# Patient Record
Sex: Male | Born: 1970 | Race: White | Hispanic: No | Marital: Single | Smoking: Never smoker
Health system: Southern US, Community
[De-identification: ages and names within clinical notes are randomized; demographics above are authoritative.]

## PROBLEM LIST (undated history)

## (undated) DIAGNOSIS — F840 Autistic disorder: Secondary | ICD-10-CM

## (undated) DIAGNOSIS — I509 Heart failure, unspecified: Secondary | ICD-10-CM

## (undated) DIAGNOSIS — T7840XA Allergy, unspecified, initial encounter: Secondary | ICD-10-CM

## (undated) DIAGNOSIS — E669 Obesity, unspecified: Secondary | ICD-10-CM

## (undated) DIAGNOSIS — E119 Type 2 diabetes mellitus without complications: Secondary | ICD-10-CM

## (undated) HISTORY — DX: Type 2 diabetes mellitus without complications: E11.9

## (undated) HISTORY — DX: Obesity, unspecified: E66.9

## (undated) HISTORY — DX: Allergy, unspecified, initial encounter: T78.40XA

## (undated) HISTORY — DX: Heart failure, unspecified: I50.9

---

## 2010-11-21 ENCOUNTER — Encounter: Payer: Self-pay | Admitting: *Deleted

## 2010-11-21 ENCOUNTER — Emergency Department (HOSPITAL_COMMUNITY): Payer: Medicare Other

## 2010-11-21 ENCOUNTER — Emergency Department (HOSPITAL_COMMUNITY)
Admission: EM | Admit: 2010-11-21 | Discharge: 2010-11-21 | Disposition: A | Discharge status: Home / Self Care | Payer: Medicare Other | Attending: Emergency Medicine | Admitting: Emergency Medicine

## 2010-11-21 DIAGNOSIS — F840 Autistic disorder: Secondary | ICD-10-CM | POA: Insufficient documentation

## 2010-11-21 DIAGNOSIS — M79609 Pain in unspecified limb: Secondary | ICD-10-CM | POA: Insufficient documentation

## 2010-11-21 DIAGNOSIS — M79672 Pain in left foot: Secondary | ICD-10-CM

## 2010-11-21 DIAGNOSIS — X58XXXA Exposure to other specified factors, initial encounter: Secondary | ICD-10-CM | POA: Insufficient documentation

## 2010-11-21 HISTORY — DX: Autistic disorder: F84.0

## 2010-11-21 NOTE — ED Notes (Signed)
Pt c/o pain in his left foot. States that he is unable to walk on it. Pt put his foot in front of a golf cart on Saturday to stop it and injured it. Foot is swollen.

## 2010-11-21 NOTE — ED Notes (Signed)
Patient is comfortable just wanting to eat something states he hasn't ate all day. Family member told patient that he could wait till the doctor gives him a verdict. RN Darel Hong aware.

## 2010-12-27 NOTE — ED Provider Notes (Signed)
History     CSN: [id number] Arrival date & time: 11/21/2010 10:42 AM   Chief Complaint  Patient presents with  . Foot Pain     (Include location/radiation/quality/duration/timing/severity/associated sxs/prior treatment) HPI Pt c/o pain in his left foot. States that he is unable to walk on it. Pt put his foot in front of a golf cart on Saturday to stop it and injured it. Foot is swollen.          Past Medical History  Diagnosis Date  . Autism      History reviewed. No pertinent past surgical history.  Family History  Problem Relation Age of Onset  . Diabetes Mother   . Cancer Father   . Diabetes Father     History  Substance Use Topics  . Smoking status: Never Smoker   . Smokeless tobacco: Not on file  . Alcohol Use: No      Review of Systems  Constitutional: Negative for fever.  Musculoskeletal:       Left foot pain  Skin: Negative for rash and wound.  Neurological: Negative for weakness.    Allergies  Review of patient's allergies indicates no known allergies.  Home Medications   Current Outpatient Rx  Name Route Sig Dispense Refill  . ACETAMINOPHEN 500 MG PO TABS Oral Take 1,000 mg by mouth every 6 (six) hours as needed. For pain       Physical Exam    BP 118/74  Pulse 78  Temp(Src) 97.3 F (36.3 C) (Oral)  Resp 16  Ht 6\' 1"  (1.854 m)  Wt 280 lb (127.007 kg)  BMI 36.94 kg/m2  SpO2 99%  Physical Exam  Constitutional: He appears well-developed and well-nourished.  HENT:  Head: Normocephalic and atraumatic.  Eyes: Pupils are equal, round, and reactive to light.  Neck: Normal range of motion.  Abdominal: He exhibits no distension.  Musculoskeletal:       Left foot tenderness    ED Course  Procedures  No results found for this or any previous visit. No results found.   1. Foot pain, left      MDM Left foot pain       Nicholes Stairs, MD 12/27/10 2054

## 2013-09-22 ENCOUNTER — Emergency Department (HOSPITAL_COMMUNITY)
Admission: EM | Admit: 2013-09-22 | Discharge: 2013-09-22 | Disposition: A | Discharge status: Home / Self Care | Payer: Medicare Other | Attending: Emergency Medicine | Admitting: Emergency Medicine

## 2013-09-22 ENCOUNTER — Encounter (HOSPITAL_COMMUNITY): Payer: Self-pay | Admitting: Emergency Medicine

## 2013-09-22 ENCOUNTER — Emergency Department (HOSPITAL_COMMUNITY): Payer: Medicare Other

## 2013-09-22 DIAGNOSIS — M25569 Pain in unspecified knee: Secondary | ICD-10-CM | POA: Diagnosis present

## 2013-09-22 DIAGNOSIS — IMO0002 Reserved for concepts with insufficient information to code with codable children: Secondary | ICD-10-CM | POA: Diagnosis not present

## 2013-09-22 DIAGNOSIS — F840 Autistic disorder: Secondary | ICD-10-CM | POA: Insufficient documentation

## 2013-09-22 DIAGNOSIS — M25559 Pain in unspecified hip: Secondary | ICD-10-CM | POA: Diagnosis not present

## 2013-09-22 DIAGNOSIS — M5416 Radiculopathy, lumbar region: Secondary | ICD-10-CM

## 2013-09-22 DIAGNOSIS — R269 Unspecified abnormalities of gait and mobility: Secondary | ICD-10-CM | POA: Diagnosis not present

## 2013-09-22 NOTE — ED Provider Notes (Signed)
CSN: [id number]     Arrival date & time 09/22/13  1117 History  This chart was scribed for non-physician practitioner Glendell Docker, NP working with Carmin Muskrat, MD by Eston Mould, ED Scribe. This patient was seen in room TR05C/TR05C and the patient's care was started at 11:34 AM .   Chief Complaint  Patient presents with  . Knee Pain  . Hip Pain   The history is provided by the patient and a parent. No language interpreter was used.   HPI Comments: Michael Ramos is a 43 y.o. male with autism who presents to the Emergency Department complaining of R knee and hip pain that began 2 weeks ago. Mother took pt to [facility] 2 weeks ago and was informed he possible has a pinched nerve. Pt was given a steroid shot for relief, X-ray of lower back for bulging disc or pinched nerve and discharged with muscle relaxer. Pt c/o mostly R knee pain and states pain worsens with weightbearing. Mother states R knee has been swollen. Mother states pt has had minimal back pain. Denies fevers, recent illness or injuries, and previous injuries.  Past Medical History  Diagnosis Date  . Autism    History reviewed. No pertinent past surgical history. Family History  Problem Relation Age of Onset  . Diabetes Mother   . Cancer Father   . Diabetes Father    History  Substance Use Topics  . Smoking status: Never Smoker   . Smokeless tobacco: Not on file  . Alcohol Use: No    Review of Systems  Constitutional: Negative for fever.  Musculoskeletal: Positive for arthralgias, back pain, gait problem and myalgias.  Skin: Negative for color change and rash.  All other systems reviewed and are negative.  Allergies  Review of patient's allergies indicates no known allergies.  Home Medications   Prior to Admission medications   Medication Sig Start Date End Date Taking? Authorizing Provider  acetaminophen (TYLENOL) 500 MG tablet Take 1,000 mg by mouth every 6 (six) hours as  needed. For pain     Historical Provider, MD   BP 138/96  Pulse 98  Temp(Src) 97.7 F (36.5 C) (Oral)  Resp 18  Ht 6\' 3"  (1.905 m)  Wt 300 lb (136.079 kg)  BMI 37.50 kg/m2  SpO2 95%  Physical Exam  Nursing note and vitals reviewed. Constitutional: He is oriented to person, place, and time. He appears well-developed and well-nourished. No distress.  HENT:  Head: Normocephalic and atraumatic.  Eyes: EOM are normal.  Neck: Neck supple. No tracheal deviation present.  Cardiovascular: Normal rate, regular rhythm and normal heart sounds.   Pulmonary/Chest: Effort normal and breath sounds normal. No respiratory distress.  Musculoskeletal: Normal range of motion.  Full ROM. No swelling or deformity noted.   Neurological: He is alert and oriented to person, place, and time.  Skin: Skin is warm and dry.  Psychiatric: He has a normal mood and affect. His behavior is normal.   ED Course  Procedures  DIAGNOSTIC STUDIES: Oxygen Saturation is 95% on RA, normal by my interpretation.    COORDINATION OF CARE: 11:39 AM-Discussed treatment plan which includes X-ray of R knee and hip. Informed mother of patient F/U with orthopedist is necessary for definitive treatment. Pt agreed to plan.   1:10 PM-Informed mother of pt of radiology findings,WNL. Will discharge pt with referrals to ortho. Mother of pt agreed to plan.  Labs Review Labs Reviewed - No data to display  Imaging Review Dg  Hip Complete Right  09/22/2013   CLINICAL DATA:  Right hip pain.  EXAM: RIGHT HIP - COMPLETE 2+ VIEW  COMPARISON:  None.  FINDINGS: The hips are normally located. No acute hip fracture or plain film evidence of avascular necrosis. The pubic symphysis and SI joints are intact. No pelvic fractures.  IMPRESSION: No acute bony findings.   Electronically Signed   By: Kalman Jewels M.D.   On: 09/22/2013 12:39   Dg Knee Complete 4 Views Right  09/22/2013   CLINICAL DATA:  Right hip pain radiates down to the right knee.  No known injury.  EXAM: RIGHT KNEE - COMPLETE 4+ VIEW  COMPARISON:  None.  FINDINGS: There is no evidence of fracture, dislocation, or joint effusion. There is no evidence of arthropathy or other focal bone abnormality. Soft tissues are unremarkable.  IMPRESSION: Negative.   Electronically Signed   By: Misty Stanley M.D.   On: 09/22/2013 12:39     EKG Interpretation None      MDM   Final diagnoses:  Lumbar radiculopathy    Pt is neurovascularly intact. No red flags. Pt has medications at home. Discussed with mother the need for possible mri of lumbar spine in the future  I personally performed the services described in this documentation, which was scribed in my presence. The recorded information has been reviewed and is accurate.    Glendell Docker, NP 09/22/13 1312

## 2013-09-22 NOTE — Discharge Instructions (Signed)

## 2013-09-22 NOTE — ED Notes (Signed)
Pt is autistic, c/o right knee and thigh pain x 2 weeks. Went to an urgent care at [facility], had lumbar xray and told "he might have a pinched nerve". Told to follow up with ortho but mother did not want to do that before getting another opinion.

## 2013-09-22 NOTE — ED Provider Notes (Signed)
  Medical screening examination/treatment/procedure(s) were performed by non-physician practitioner and as supervising physician I was immediately available for consultation/collaboration.   EKG Interpretation None         Carmin Muskrat, MD 09/22/13 1630

## 2013-09-22 NOTE — ED Notes (Signed)
Pt here for hip and knee pain x 2 weeks on right side, sts no injury. No orthopeadic

## 2015-06-21 DIAGNOSIS — J40 Bronchitis, not specified as acute or chronic: Secondary | ICD-10-CM | POA: Diagnosis not present

## 2015-09-03 DIAGNOSIS — R5383 Other fatigue: Secondary | ICD-10-CM | POA: Diagnosis not present

## 2015-09-03 DIAGNOSIS — E119 Type 2 diabetes mellitus without complications: Secondary | ICD-10-CM | POA: Diagnosis not present

## 2015-10-04 DIAGNOSIS — E139 Other specified diabetes mellitus without complications: Secondary | ICD-10-CM | POA: Diagnosis not present

## 2015-12-02 ENCOUNTER — Telehealth: Payer: Self-pay | Admitting: Physician Assistant

## 2015-12-03 NOTE — Telephone Encounter (Signed)
He was a new onset diabetic, since late spring, found through urgent care, started on med, then saw me in July.  He was told to get follow up.  I do think her is okay to be in the coming weeks and labs performed.  If any one can see him once or if my medicaid credentialing comes in soon I can do him.  Labs needed: CMP, A1c, LIPID.

## 2015-12-08 NOTE — Telephone Encounter (Signed)
I spoke with patient's mother and she understood that we will get him in as soon as insurance clears.

## 2015-12-27 ENCOUNTER — Other Ambulatory Visit: Payer: Self-pay | Admitting: Physician Assistant

## 2016-01-18 ENCOUNTER — Ambulatory Visit (INDEPENDENT_AMBULATORY_CARE_PROVIDER_SITE_OTHER): Payer: Medicare Other | Admitting: *Deleted

## 2016-01-18 DIAGNOSIS — Z23 Encounter for immunization: Secondary | ICD-10-CM

## 2016-03-14 ENCOUNTER — Encounter: Payer: Self-pay | Admitting: Physician Assistant

## 2016-03-14 ENCOUNTER — Ambulatory Visit (INDEPENDENT_AMBULATORY_CARE_PROVIDER_SITE_OTHER): Payer: Medicare Other | Admitting: Physician Assistant

## 2016-03-14 VITALS — BP 137/95 | HR 93 | Temp 96.8°F | Ht 75.0 in | Wt 282.0 lb

## 2016-03-14 DIAGNOSIS — E7800 Pure hypercholesterolemia, unspecified: Secondary | ICD-10-CM | POA: Diagnosis not present

## 2016-03-14 DIAGNOSIS — J3089 Other allergic rhinitis: Secondary | ICD-10-CM | POA: Diagnosis not present

## 2016-03-14 DIAGNOSIS — E119 Type 2 diabetes mellitus without complications: Secondary | ICD-10-CM

## 2016-03-14 DIAGNOSIS — F840 Autistic disorder: Secondary | ICD-10-CM | POA: Insufficient documentation

## 2016-03-14 DIAGNOSIS — E1169 Type 2 diabetes mellitus with other specified complication: Secondary | ICD-10-CM | POA: Insufficient documentation

## 2016-03-14 LAB — BAYER DCA HB A1C WAIVED: HB A1C (BAYER DCA - WAIVED): 6.1 % (ref <7.0)

## 2016-03-14 MED ORDER — LORATADINE 10 MG PO TABS: 10.0000 mg | ORAL_TABLET | Freq: Every day | ORAL | 11 refills | Status: DC

## 2016-03-14 MED ORDER — METFORMIN HCL 500 MG PO TABS: 500.0000 mg | ORAL_TABLET | Freq: Two times a day (BID) | ORAL | 5 refills | Status: DC

## 2016-03-14 NOTE — Progress Notes (Signed)
BP (!) 137/95   Pulse 93   Temp (!) 96.8 F (36 C) (Oral)   Ht 6' 3" (1.905 m)   Wt 282 lb (127.9 kg)   BMI 35.25 kg/m    Subjective:    Patient ID: Michael Ramos, male    DOB: [birth year], 45 y.o.   MRN: [id number]  Michael Ramos is a 45 y.o. male presenting on 03/14/2016 for Follow-up and Diabetes  HPI Patient here to be established as new patient at [facility].  This patient is known to me from [facility]. Commonly seen this patient twice this past year and they have been for the mild onset of diabetes. He has had a very good control with his A1c. He needs to have this performed again today. He reports that he is walking and exercising well. He is fairly well communicative with me by his mom is in the room. Sister is also present today. He was excited to talk about [place] basketball and professional wrestling. I do think we are developing a trusting relationship. All of his medications are reviewed today.  Past Medical History:  Diagnosis Date  . Allergy   . Autism   . Diabetes mellitus without complication ([place])    Relevant past medical, surgical, family and social history reviewed and updated as indicated. Interim medical history since our last visit reviewed. Allergies and medications reviewed and updated.   Data reviewed from any sources in EPIC.  Review of Systems  Constitutional: Negative.  Negative for appetite change and fatigue.  HENT: Negative.   Eyes: Negative.  Negative for pain and visual disturbance.  Respiratory: Negative.  Negative for cough, chest tightness, shortness of breath and wheezing.   Cardiovascular: Negative.  Negative for chest pain, palpitations and leg swelling.  Gastrointestinal: Negative.  Negative for abdominal pain, diarrhea, nausea and vomiting.  Endocrine: Negative.   Genitourinary: Negative.   Musculoskeletal: Negative.   Skin: Negative.  Negative for color change and rash.  Neurological:  Negative.  Negative for weakness, numbness and headaches.  Psychiatric/Behavioral: Negative.      Social History   Social History  . Marital status: Single    Spouse name: N/A  . Number of children: N/A  . Years of education: N/A   Occupational History  . Not on file.   Social History Main Topics  . Smoking status: Never Smoker  . Smokeless tobacco: Never Used  . Alcohol use No  . Drug use: No  . Sexual activity: Not on file   Other Topics Concern  . Not on file   Social History Narrative  . No narrative on file    History reviewed. No pertinent surgical history.  Family History  Problem Relation Age of Onset  . Diabetes Mother   . Cancer Father   . Diabetes Father       Medication List       Accurate as of 03/14/16  9:56 PM. Always use your most recent med list.          loratadine 10 MG tablet Commonly known as:  CLARITIN Take 1 tablet (10 mg total) by mouth daily.   metFORMIN 500 MG tablet Commonly known as:  GLUCOPHAGE Take 1 tablet (500 mg total) by mouth 2 (two) times daily.          Objective:    BP (!) 137/95   Pulse 93   Temp (!) 96.8 F (36 C) (Oral)   Ht 6' 3" (  1.905 m)   Wt 282 lb (127.9 kg)   BMI 35.25 kg/m   No Known Allergies Wt Readings from Last 3 Encounters:  03/14/16 282 lb (127.9 kg)  09/22/13 300 lb (136.1 kg)  11/21/10 280 lb (127 kg)    Physical Exam  Constitutional: He appears well-developed and well-nourished. No distress.  HENT:  Head: Normocephalic and atraumatic.  Eyes: Conjunctivae and EOM are normal. Pupils are equal, round, and reactive to light.  Cardiovascular: Normal rate, regular rhythm and normal heart sounds.   Pulmonary/Chest: Effort normal and breath sounds normal. No respiratory distress.  Skin: Skin is warm and dry.  Psychiatric: He has a normal mood and affect. His behavior is normal.  Nursing note and vitals reviewed.   Results for orders placed or performed in visit on 03/14/16  Bayer  DCA Hb A1c Waived  Result Value Ref Range   Bayer DCA Hb A1c Waived 6.1 <7.0 %      Assessment & Plan:   1. Type 2 diabetes mellitus without complication, without long-term current use of insulin (HCC) - metFORMIN (GLUCOPHAGE) 500 MG tablet; Take 1 tablet (500 mg total) by mouth 2 (two) times daily.  Dispense: 60 tablet; Refill: 5 - Bayer DCA Hb A1c Waived - CBC with Differential/Platelet - CMP14+EGFR - Lipid panel  2. Chronic nonseasonal allergic rhinitis due to pollen - loratadine (CLARITIN) 10 MG tablet; Take 1 tablet (10 mg total) by mouth daily.  Dispense: 30 tablet; Refill: 11  3. Autism  4. Elevated cholesterol - CMP14+EGFR - Lipid panel  5. Elevated blood pressure reading Daily walking and exercise, low fat, low cholesterol diet  Continue all other maintenance medications as listed above. Educational handout given for carb counting  Follow up plan: Return in about 3 months (around 06/12/2016).  Terald Sleeper PA-C [facility] [address]  [place], [place] [address] [phone]   03/14/2016, 9:56 PM

## 2016-03-14 NOTE — Patient Instructions (Signed)
Carbohydrate Counting for Diabetes Mellitus, Adult Carbohydrate counting is a method for keeping track of how many carbohydrates you eat. Eating carbohydrates naturally increases the amount of sugar (glucose) in the blood. Counting how many carbohydrates you eat helps keep your blood glucose within normal limits, which helps you manage your diabetes (diabetes mellitus). It is important to know how many carbohydrates you can safely have in each meal. This is different for every person. A diet and nutrition specialist (registered dietitian) can help you make a meal plan and calculate how many carbohydrates you should have at each meal and snack. Carbohydrates are found in the following foods:  Grains, such as breads and cereals.  Dried beans and soy products.  Starchy vegetables, such as potatoes, peas, and corn.  Fruit and fruit juices.  Milk and yogurt.  Sweets and snack foods, such as cake, cookies, candy, chips, and soft drinks. How do I count carbohydrates? There are two ways to count carbohydrates in food. You can use either of the methods or a combination of both. Reading "Nutrition Facts" on packaged food  The "Nutrition Facts" list is included on the labels of almost all packaged foods and beverages in the [place] It includes:  The serving size.  Information about nutrients in each serving, including the grams (g) of carbohydrate per serving. To use the "Nutrition Facts":  Decide how many servings you will have.  Multiply the number of servings by the number of carbohydrates per serving.  The resulting number is the total amount of carbohydrates that you will be having. Learning standard serving sizes of other foods  When you eat foods containing carbohydrates that are not packaged or do not include "Nutrition Facts" on the label, you need to measure the servings in order to count the amount of carbohydrates:  Measure the foods that you will eat with a food scale or measuring  cup, if needed.  Decide how many standard-size servings you will eat.  Multiply the number of servings by 15. Most carbohydrate-rich foods have about 15 g of carbohydrates per serving.  For example, if you eat 8 oz (170 g) of strawberries, you will have eaten 2 servings and 30 g of carbohydrates (2 servings x 15 g = 30 g).  For foods that have more than one food mixed, such as soups and casseroles, you must count the carbohydrates in each food that is included. The following list contains standard serving sizes of common carbohydrate-rich foods. Each of these servings has about 15 g of carbohydrates:   hamburger bun or  English muffin.   oz (15 mL) syrup.   oz (14 g) jelly.  1 slice of bread.  1 six-inch tortilla.  3 oz (85 g) cooked rice or pasta.  4 oz (113 g) cooked dried beans.  4 oz (113 g) starchy vegetable, such as peas, corn, or potatoes.  4 oz (113 g) hot cereal.  4 oz (113 g) mashed potatoes or  of a large baked potato.  4 oz (113 g) canned or frozen fruit.  4 oz (120 mL) fruit juice.  4-6 crackers.  6 chicken nuggets.  6 oz (170 g) unsweetened dry cereal.  6 oz (170 g) plain fat-free yogurt or yogurt sweetened with artificial sweeteners.  8 oz (240 mL) milk.  8 oz (170 g) fresh fruit or one small piece of fruit.  24 oz (680 g) popped popcorn. Example of carbohydrate counting Sample meal  3 oz (85 g) chicken breast.  6 oz (  170 g) brown rice.  4 oz (113 g) corn.  8 oz (240 mL) milk.  8 oz (170 g) strawberries with sugar-free whipped topping. Carbohydrate calculation 1. Identify the foods that contain carbohydrates:  Rice.  Corn.  Milk.  Strawberries. 2. Calculate how many servings you have of each food:  2 servings rice.  1 serving corn.  1 serving milk.  1 serving strawberries. 3. Multiply each number of servings by 15 g:  2 servings rice x 15 g = 30 g.  1 serving corn x 15 g = 15 g.  1 serving milk x 15 g = 15  g.  1 serving strawberries x 15 g = 15 g. 4. Add together all of the amounts to find the total grams of carbohydrates eaten:  30 g + 15 g + 15 g + 15 g = 75 g of carbohydrates total. This information is not intended to replace advice given to you by your health care provider. Make sure you discuss any questions you have with your health care provider. Document Released: 03/27/2005 Document Revised: 10/15/2015 Document Reviewed: 09/08/2015 Elsevier Interactive Patient Education  2017 [employer]  

## 2016-03-15 LAB — CBC WITH DIFFERENTIAL/PLATELET
BASOS: 1 %
Basophils Absolute: 0.1 10*3/uL (ref 0.0–0.2)
EOS (ABSOLUTE): 0.3 10*3/uL (ref 0.0–0.4)
EOS: 3 %
HEMATOCRIT: 47.2 % (ref 37.5–51.0)
Hemoglobin: 15.3 g/dL (ref 13.0–17.7)
IMMATURE GRANULOCYTES: 0 %
Immature Grans (Abs): 0 10*3/uL (ref 0.0–0.1)
LYMPHS ABS: 1.8 10*3/uL (ref 0.7–3.1)
Lymphs: 19 %
MCH: 28.8 pg (ref 26.6–33.0)
MCHC: 32.4 g/dL (ref 31.5–35.7)
MCV: 89 fL (ref 79–97)
MONOCYTES: 8 %
Monocytes Absolute: 0.7 10*3/uL (ref 0.1–0.9)
Neutrophils Absolute: 6.6 10*3/uL (ref 1.4–7.0)
Neutrophils: 69 %
PLATELETS: 230 10*3/uL (ref 150–379)
RBC: 5.31 x10E6/uL (ref 4.14–5.80)
RDW: 14 % (ref 12.3–15.4)
WBC: 9.4 10*3/uL (ref 3.4–10.8)

## 2016-03-15 LAB — CMP14+EGFR
ALT: 26 IU/L (ref 0–44)
AST: 17 IU/L (ref 0–40)
Albumin/Globulin Ratio: 1.4 (ref 1.2–2.2)
Albumin: 4 g/dL (ref 3.5–5.5)
Alkaline Phosphatase: 33 IU/L — ABNORMAL LOW (ref 39–117)
BUN/Creatinine Ratio: 12 (ref 9–20)
BUN: 15 mg/dL (ref 6–24)
Bilirubin Total: 0.4 mg/dL (ref 0.0–1.2)
CALCIUM: 9.2 mg/dL (ref 8.7–10.2)
CO2: 24 mmol/L (ref 18–29)
CREATININE: 1.27 mg/dL (ref 0.76–1.27)
Chloride: 102 mmol/L (ref 96–106)
GFR calc Af Amer: 79 mL/min/{1.73_m2} (ref >59)
GFR, EST NON AFRICAN AMERICAN: 68 mL/min/{1.73_m2} (ref >59)
Globulin, Total: 2.9 g/dL (ref 1.5–4.5)
Glucose: 124 mg/dL — ABNORMAL HIGH (ref 65–99)
Potassium: 5.1 mmol/L (ref 3.5–5.2)
Sodium: 142 mmol/L (ref 134–144)
TOTAL PROTEIN: 6.9 g/dL (ref 6.0–8.5)

## 2016-03-15 LAB — LIPID PANEL
CHOL/HDL RATIO: 2.7 ratio (ref 0.0–5.0)
Cholesterol, Total: 126 mg/dL (ref 100–199)
HDL: 47 mg/dL (ref >39)
LDL CALC: 42 mg/dL (ref 0–99)
TRIGLYCERIDES: 187 mg/dL — AB (ref 0–149)
VLDL CHOLESTEROL CAL: 37 mg/dL (ref 5–40)

## 2016-07-20 ENCOUNTER — Other Ambulatory Visit: Payer: Self-pay | Admitting: Physician Assistant

## 2016-07-20 DIAGNOSIS — E119 Type 2 diabetes mellitus without complications: Secondary | ICD-10-CM

## 2016-08-15 ENCOUNTER — Emergency Department (HOSPITAL_COMMUNITY): Payer: Medicare Other

## 2016-08-15 ENCOUNTER — Encounter (HOSPITAL_COMMUNITY): Payer: Self-pay | Admitting: *Deleted

## 2016-08-15 ENCOUNTER — Inpatient Hospital Stay (HOSPITAL_COMMUNITY)
Admission: EM | Admit: 2016-08-15 | Discharge: 2016-08-19 | DRG: 292 | Disposition: A | Discharge status: Home / Self Care | Payer: Medicare Other | Attending: Internal Medicine | Admitting: Internal Medicine

## 2016-08-15 DIAGNOSIS — J90 Pleural effusion, not elsewhere classified: Secondary | ICD-10-CM

## 2016-08-15 DIAGNOSIS — N179 Acute kidney failure, unspecified: Secondary | ICD-10-CM | POA: Diagnosis present

## 2016-08-15 DIAGNOSIS — Z8249 Family history of ischemic heart disease and other diseases of the circulatory system: Secondary | ICD-10-CM

## 2016-08-15 DIAGNOSIS — R0602 Shortness of breath: Secondary | ICD-10-CM | POA: Diagnosis not present

## 2016-08-15 DIAGNOSIS — F840 Autistic disorder: Secondary | ICD-10-CM | POA: Diagnosis not present

## 2016-08-15 DIAGNOSIS — Z6834 Body mass index (BMI) 34.0-34.9, adult: Secondary | ICD-10-CM

## 2016-08-15 DIAGNOSIS — E119 Type 2 diabetes mellitus without complications: Secondary | ICD-10-CM | POA: Diagnosis not present

## 2016-08-15 DIAGNOSIS — Z7984 Long term (current) use of oral hypoglycemic drugs: Secondary | ICD-10-CM

## 2016-08-15 DIAGNOSIS — E669 Obesity, unspecified: Secondary | ICD-10-CM | POA: Diagnosis present

## 2016-08-15 DIAGNOSIS — I313 Pericardial effusion (noninflammatory): Secondary | ICD-10-CM | POA: Diagnosis present

## 2016-08-15 DIAGNOSIS — I5021 Acute systolic (congestive) heart failure: Secondary | ICD-10-CM | POA: Diagnosis not present

## 2016-08-15 DIAGNOSIS — R079 Chest pain, unspecified: Secondary | ICD-10-CM | POA: Diagnosis not present

## 2016-08-15 DIAGNOSIS — R410 Disorientation, unspecified: Secondary | ICD-10-CM | POA: Diagnosis not present

## 2016-08-15 DIAGNOSIS — S30861A Insect bite (nonvenomous) of abdominal wall, initial encounter: Secondary | ICD-10-CM | POA: Diagnosis present

## 2016-08-15 DIAGNOSIS — W57XXXA Bitten or stung by nonvenomous insect and other nonvenomous arthropods, initial encounter: Secondary | ICD-10-CM | POA: Diagnosis present

## 2016-08-15 DIAGNOSIS — T501X5A Adverse effect of loop [high-ceiling] diuretics, initial encounter: Secondary | ICD-10-CM | POA: Diagnosis present

## 2016-08-15 DIAGNOSIS — E1169 Type 2 diabetes mellitus with other specified complication: Secondary | ICD-10-CM

## 2016-08-15 DIAGNOSIS — I493 Ventricular premature depolarization: Secondary | ICD-10-CM | POA: Diagnosis present

## 2016-08-15 LAB — BASIC METABOLIC PANEL
Anion gap: 9 (ref 5–15)
BUN: 11 mg/dL (ref 6–20)
CHLORIDE: 103 mmol/L (ref 101–111)
CO2: 27 mmol/L (ref 22–32)
Calcium: 9 mg/dL (ref 8.9–10.3)
Creatinine, Ser: 1.27 mg/dL — ABNORMAL HIGH (ref 0.61–1.24)
Glucose, Bld: 143 mg/dL — ABNORMAL HIGH (ref 65–99)
POTASSIUM: 4.5 mmol/L (ref 3.5–5.1)
SODIUM: 139 mmol/L (ref 135–145)

## 2016-08-15 LAB — CBC
HEMATOCRIT: 46.8 % (ref 39.0–52.0)
Hemoglobin: 15.6 g/dL (ref 13.0–17.0)
MCH: 29.4 pg (ref 26.0–34.0)
MCHC: 33.3 g/dL (ref 30.0–36.0)
MCV: 88.3 fL (ref 78.0–100.0)
PLATELETS: 220 10*3/uL (ref 150–400)
RBC: 5.3 MIL/uL (ref 4.22–5.81)
RDW: 13.6 % (ref 11.5–15.5)
WBC: 11 10*3/uL — AB (ref 4.0–10.5)

## 2016-08-15 LAB — LACTIC ACID, PLASMA: Lactic Acid, Venous: 1.2 mmol/L (ref 0.5–1.9)

## 2016-08-15 LAB — TROPONIN I: Troponin I: <0.03 ng/mL (ref <0.03)

## 2016-08-15 LAB — HEPATIC FUNCTION PANEL
ALT: 51 U/L (ref 17–63)
AST: 30 U/L (ref 15–41)
Albumin: 3.9 g/dL (ref 3.5–5.0)
Alkaline Phosphatase: 32 U/L — ABNORMAL LOW (ref 38–126)
Bilirubin, Direct: 0.2 mg/dL (ref 0.1–0.5)
Indirect Bilirubin: 0.6 mg/dL (ref 0.3–0.9)
TOTAL PROTEIN: 7.3 g/dL (ref 6.5–8.1)
Total Bilirubin: 0.8 mg/dL (ref 0.3–1.2)

## 2016-08-15 LAB — BRAIN NATRIURETIC PEPTIDE: B NATRIURETIC PEPTIDE 5: 194 pg/mL — AB (ref 0.0–100.0)

## 2016-08-15 MED ORDER — SODIUM CHLORIDE 0.9 % IN NEBU
INHALATION_SOLUTION | RESPIRATORY_TRACT | Status: AC
  Administered 2016-08-15: 3 mL
  Filled 2016-08-15: qty 3

## 2016-08-15 MED ORDER — LEVALBUTEROL HCL 1.25 MG/0.5ML IN NEBU
1.2500 mg | INHALATION_SOLUTION | Freq: Four times a day (QID) | RESPIRATORY_TRACT | Status: DC | PRN
  Administered 2016-08-15 – 2016-08-18 (×6): 1.25 mg via RESPIRATORY_TRACT
  Filled 2016-08-15 (×6): qty 0.5

## 2016-08-15 MED ORDER — SODIUM CHLORIDE 0.9 % IV BOLUS (SEPSIS)
2000.0000 mL | Freq: Once | INTRAVENOUS | Status: AC
  Administered 2016-08-15: 2000 mL via INTRAVENOUS

## 2016-08-15 MED ORDER — IOPAMIDOL (ISOVUE-300) INJECTION 61%
75.0000 mL | Freq: Once | INTRAVENOUS | Status: AC | PRN
  Administered 2016-08-15: 75 mL via INTRAVENOUS

## 2016-08-15 MED ORDER — ACETAMINOPHEN 325 MG PO TABS: 650.0000 mg | ORAL_TABLET | Freq: Four times a day (QID) | ORAL | Status: DC | PRN

## 2016-08-15 MED ORDER — LORAZEPAM 1 MG PO TABS
1.0000 mg | ORAL_TABLET | Freq: Once | ORAL | Status: AC
  Administered 2016-08-15: 1 mg via ORAL
  Filled 2016-08-15: qty 1

## 2016-08-15 MED ORDER — ACETAMINOPHEN 650 MG RE SUPP: 650.0000 mg | Freq: Four times a day (QID) | RECTAL | Status: DC | PRN

## 2016-08-15 MED ORDER — GI COCKTAIL ~~LOC~~: 30.0000 mL | Freq: Two times a day (BID) | ORAL | Status: DC | PRN

## 2016-08-15 MED ORDER — ENOXAPARIN SODIUM 60 MG/0.6ML ~~LOC~~ SOLN
60.0000 mg | SUBCUTANEOUS | Status: DC
  Administered 2016-08-15 – 2016-08-18 (×4): 60 mg via SUBCUTANEOUS
  Filled 2016-08-15 (×4): qty 0.6

## 2016-08-15 MED ORDER — GUAIFENESIN-DM 100-10 MG/5ML PO SYRP
5.0000 mL | ORAL_SOLUTION | ORAL | Status: DC | PRN
  Administered 2016-08-16 – 2016-08-19 (×8): 5 mL via ORAL
  Filled 2016-08-15 (×8): qty 5

## 2016-08-15 MED ORDER — HYDROCOD POLST-CPM POLST ER 10-8 MG/5ML PO SUER
5.0000 mL | Freq: Once | ORAL | Status: AC
  Administered 2016-08-15: 5 mL via ORAL
  Filled 2016-08-15: qty 5

## 2016-08-15 MED ORDER — LEVOFLOXACIN IN D5W 500 MG/100ML IV SOLN
500.0000 mg | Freq: Once | INTRAVENOUS | Status: AC
  Administered 2016-08-15: 500 mg via INTRAVENOUS
  Filled 2016-08-15: qty 100

## 2016-08-15 NOTE — ED Notes (Signed)
Pt 02 sat 87 % on RA while sleeping, pt woke up and sat increased to 99% on RA, Dr. Roderic Palau notified.

## 2016-08-15 NOTE — H&P (Signed)
History and Physical    BAZIL DHANANI SJG:[id number] DOB: 1970-08-16 DOA: 08/15/2016  PCP: Terald Sleeper, PA-C   Patient coming from: Home  Chief Complaint: Chest pain, stomach pain, cough  HPI: Michael Ramos is a 46 y.o. male with medical history significant of autism/ Aspereger's presents with his mother for concern of chest pain and stomach pain.  Per mother patient was essentially normal/ at baseline yesterday but last night shortly after midnight patient woke his mother up with concerns of abdominal pain and chest pain.  Mother voices that patient did not sleep much last night due to going to bathroom repeatedly.  Mother reports that patient was unable to lay flat last night secondary to being short of breath.  Mother brought patient to urgent care this morning and one of the nurses told patient's mother that he was worried about the patient.  Mother says that patient seemed to be in a fog this morning, needing questions repeated numerous times before being able to answer.  Patient mother also says that patient removed a tick from his left side a few days ago.  Denies any fever, chills, denies nausea, vomiting, constipation.  8 stools last night but mother is unsure if they were loose or if they were bloody.  ED Course: Blood work showing creatinine of 1.29, BNP of 194.0, WBC of 11.  Blood cultures were collected.  Chest xray performed showing Mild right lower lobe airspace disease and small right effusion. Differential diagnosis includes infection and pulmonary embolism, Neoplasm also in the differential.  CT scan w/ contrast ordered and showed Moderate right and small left pleural effusion. Bibasilar dependent airspace disease likely reflecting atelectasis versus less likely pneumonia. Bilateral mild interstitial thickening suggesting an element of mild pulmonary edema. TRH was asked to admit for evaluation of cough, chest pain and stomach pain.  Review of Systems: As per HPI otherwise 10  point review of systems negative.    Past Medical History:  Diagnosis Date  . Allergy   . Autism   . Diabetes mellitus without complication ([place])     History reviewed. No pertinent surgical history.   reports that he has never smoked. He has never used smokeless tobacco. He reports that he does not drink alcohol or use drugs.  No Known Allergies  Family History  Problem Relation Age of Onset  . Diabetes Mother   . Cancer Father   . Diabetes Father      Prior to Admission medications   Medication Sig Start Date End Date Taking? Authorizing Provider  loratadine (CLARITIN) 10 MG tablet Take 1 tablet (10 mg total) by mouth daily. 03/14/16  Yes Terald Sleeper, PA-C  metFORMIN (GLUCOPHAGE) 500 MG tablet TAKE ONE TABLET BY MOUTH TWICE DAILY 07/21/16  Yes Terald Sleeper, PA-C    Physical Exam: Vitals:   08/15/16 1400 08/15/16 1500 08/15/16 1710 08/15/16 1712  BP: (!) 134/92 (!) 159/97 (!) 141/119 (!) 161/98  Pulse: (!) 57 (!) 57 (!) 109 (!) 52  Resp: (!) 24 20 18 18   Temp:    97.6 F (36.4 C)  TempSrc:    Oral  SpO2: 100% 95% 94% 96%  Weight:      Height:          Constitutional: NAD, calm, comfortable Vitals:   08/15/16 1400 08/15/16 1500 08/15/16 1710 08/15/16 1712  BP: (!) 134/92 (!) 159/97 (!) 141/119 (!) 161/98  Pulse: (!) 57 (!) 57 (!) 109 (!) 52  Resp: (!) 24  20 18 18   Temp:    97.6 F (36.4 C)  TempSrc:    Oral  SpO2: 100% 95% 94% 96%  Weight:      Height:       Eyes: PERRL, lids and conjunctivae normal ENMT: Mucous membranes are moist. Posterior pharynx clear of any exudate or lesions.Normal dentition.  Neck: normal, supple, no masses, no thyromegaly Respiratory: clear to auscultation bilaterally, no wheezing, no crackles. Normal respiratory effort. No accessory muscle use.  Cardiovascular: Regular rate and rhythm, no murmurs / rubs / gallops. No extremity edema. 2+ pedal pulses. No carotid bruits.  Abdomen: slight tenderness in the epigastrium, no  masses palpated. No hepatosplenomegaly. Bowel sounds positive.  Musculoskeletal: no clubbing / cyanosis. No joint deformity upper and lower extremities. Good ROM, no contractures. Normal muscle tone.  Skin: no rashes, lesions, ulcers. No induration Neurologic: CN 2-12 grossly intact. Sensation intact, DTR normal. Strength 5/5 in all 4.  Psychiatric: Alert and oriented to person. Normal mood.     Labs on Admission: I have personally reviewed following labs and imaging studies  CBC:  Recent Labs Lab 08/15/16 1115  WBC 11.0*  HGB 15.6  HCT 46.8  MCV 88.3  PLT 086   Basic Metabolic Panel:  Recent Labs Lab 08/15/16 1115  NA 139  K 4.5  CL 103  CO2 27  GLUCOSE 143*  BUN 11  CREATININE 1.27*  CALCIUM 9.0   GFR: Estimated Creatinine Clearance: 107.3 mL/min (A) (by C-G formula based on SCr of 1.27 mg/dL (H)). Liver Function Tests: No results for input(s): AST, ALT, ALKPHOS, BILITOT, PROT, ALBUMIN in the last 168 hours. No results for input(s): LIPASE, AMYLASE in the last 168 hours. No results for input(s): AMMONIA in the last 168 hours. Coagulation Profile: No results for input(s): INR, PROTIME in the last 168 hours. Cardiac Enzymes:  Recent Labs Lab 08/15/16 1115  TROPONINI <0.03   BNP (last 3 results) No results for input(s): PROBNP in the last 8760 hours. HbA1C: No results for input(s): HGBA1C in the last 72 hours. CBG: No results for input(s): GLUCAP in the last 168 hours. Lipid Profile: No results for input(s): CHOL, HDL, LDLCALC, TRIG, CHOLHDL, LDLDIRECT in the last 72 hours. Thyroid Function Tests: No results for input(s): TSH, T4TOTAL, FREET4, T3FREE, THYROIDAB in the last 72 hours. Anemia Panel: No results for input(s): VITAMINB12, FOLATE, FERRITIN, TIBC, IRON, RETICCTPCT in the last 72 hours. Urine analysis: No results found for: COLORURINE, APPEARANCEUR, LABSPEC, [place], GLUCOSEU, [place], BILIRUBINUR, KETONESUR, PROTEINUR, UROBILINOGEN, NITRITE,  LEUKOCYTESUR Sepsis Labs: !!!!!!!!!!!!!!!!!!!!!!!!!!!!!!!!!!!!!!!!!!!! @LABRCNTIP (procalcitonin:4,lacticidven:4) ) Recent Results (from the past 240 hour(s))  Blood culture (routine x 2)     Status: None (Preliminary result)   Collection Time: 08/15/16  1:22 PM  Result Value Ref Range Status   Specimen Description BLOOD RIGHT ARM  Final   Special Requests   Final    BOTTLES DRAWN AEROBIC AND ANAEROBIC Blood Culture adequate volume   Culture PENDING  Incomplete   Report Status PENDING  Incomplete  Blood culture (routine x 2)     Status: None (Preliminary result)   Collection Time: 08/15/16  1:29 PM  Result Value Ref Range Status   Specimen Description BLOOD RIGHT HAND  Final   Special Requests   Final    BOTTLES DRAWN AEROBIC ONLY Blood Culture results may not be optimal due to an inadequate volume of blood received in culture bottles   Culture PENDING  Incomplete   Report Status PENDING  Incomplete  Radiological Exams on Admission: Dg Chest 2 View  Result Date: 08/15/2016 CLINICAL DATA:  Chest pain EXAM: CHEST  2 VIEW COMPARISON:  None. FINDINGS: Small right pleural effusion with mild right lower lobe airspace disease. Left lung is clear. Negative for heart failure or edema. Heart size upper normal. No mass lesion identified. IMPRESSION: Mild right lower lobe airspace disease and small right effusion. Differential diagnosis includes infection and pulmonary embolism. Neoplasm also in the differential. Electronically Signed   By: Franchot Gallo M.D.   On: 08/15/2016 11:47   Ct Chest W Contrast  Result Date: 08/15/2016 CLINICAL DATA:  Cough since last night. EXAM: CT CHEST WITH CONTRAST TECHNIQUE: Multidetector CT imaging of the chest was performed during intravenous contrast administration. CONTRAST:  71mL ISOVUE-300 IOPAMIDOL (ISOVUE-300) INJECTION 61% COMPARISON:  None. FINDINGS: Cardiovascular: No significant vascular findings. Normal heart size. No pericardial effusion. Normal  caliber thoracic aorta. No thoracic aortic dissection. Mediastinum/Nodes: No enlarged mediastinal, hilar, or axillary lymph nodes. Thyroid gland, trachea, and esophagus demonstrate no significant findings. Lungs/Pleura: Moderate right and small left pleural effusion. Bibasilar dependent airspace disease likely reflecting atelectasis versus less likely pneumonia. No pneumothorax. Mild bilateral interstitial thickening. Upper Abdomen: No acute upper abdominal abnormality. Musculoskeletal: No acute osseous abnormality. No lytic or sclerotic osseous lesion. IMPRESSION: 1. Moderate right and small left pleural effusion. Bibasilar dependent airspace disease likely reflecting atelectasis versus less likely pneumonia. Bilateral mild interstitial thickening suggesting an element of mild pulmonary edema. Electronically Signed   By: Kathreen Devoid   On: 08/15/2016 13:32    EKG: Independently reviewed. Sinus tachycardia with PVCs  Assessment/Plan Principal Problem:   Shortness of breath Active Problems:   Type 2 diabetes mellitus without complication, without long-term current use of insulin (HCC)   Autism   Shortness of breath - CT showing pleural effusions - IR consult placed - patient not requiring supplemental oxygen - no respiratory distress currently - will place on telemetry - echocardiogram ordered - if patient appropriate for tap will need labs placed - troponins x 3 ordered  Type II diabetes  - carb modified diet - will order CBG's - hold metformin given the numerous bowel movements patient had  Chest pain - really epigastric pain - will order GI cocktail PRN - troponins x 3   DVT prophylaxis: lovenox  Code Status: Full Code  Family Communication: Mother is bedside  Disposition Plan: likely discharge back to previous home environment  Consults called: none  Admission status: observation, med- surg    Loretha Stapler MD [facility] Pager [phone]  If 7PM-7AM,  please contact night-coverage [email or web address] Password [email or web address]  08/15/2016, 5:29 PM

## 2016-08-15 NOTE — ED Triage Notes (Addendum)
Pt c/o diarrhea x 8, tick bite to abdomen 2 days ago, heart beating fast, mid chest pain, coughing, SOB, fatigue that started yesterday. Pt is autistic. Mother and sister at bedside. Pt will answer questions appropriately. Family reported pt has been disoriented this morning, gradually started last night.

## 2016-08-15 NOTE — Progress Notes (Signed)
PHarmacy Dosing Clarification  Pharmacy Consult for lovenox   Indication: DVT  No Known Allergies  Patient Measurements: Height: 6\' 3"  (190.5 cm) Weight: 284 lb 11.2 oz (129.1 kg) IBW/kg (Calculated) : 84.5   Vital Signs: Temp: 97.7 F (36.5 C) (05/08 1751) Temp Source: Oral (05/08 1751) BP: 152/105 (05/08 1751) Pulse Rate: 106 (05/08 1751)  Labs:  Recent Labs  08/15/16 1115  HGB 15.6  HCT 46.8  PLT 220  CREATININE 1.27*  TROPONINI <0.03    Estimated Creatinine Clearance: 107.4 mL/min (A) (by C-G formula based on SCr of 1.27 mg/dL (H)).   Medications:  Prescriptions Prior to Admission  Medication Sig Dispense Refill Last Dose  . loratadine (CLARITIN) 10 MG tablet Take 1 tablet (10 mg total) by mouth daily. 30 tablet 11 08/14/2016 at Unknown time  . metFORMIN (GLUCOPHAGE) 500 MG tablet TAKE ONE TABLET BY MOUTH TWICE DAILY 180 tablet 0 08/14/2016 at Unknown time    Assessment: 46 yo M with lovenox ordered for DVT pxl and BMI > 30.  Will give lovenox 0.5 mg /kg q24h. Further adjustments per md.  Goal of Therapy:  DVT prophylaxis    Plan:  Lovenox 60 mg sq q24h  Vonda Antigua 08/15/2016,6:22 PM

## 2016-08-15 NOTE — ED Provider Notes (Signed)
[place] DEPT Provider Note   CSN: [id number] Arrival date & time: 08/15/16  1025  By signing my name below, I, Hansel Feinstein, attest that this documentation has been prepared under the direction and in the presence of Milton Ferguson, MD. Electronically Signed: Hansel Feinstein, ED Scribe. 08/15/16. 12:21 PM.     History   Chief Complaint Chief Complaint  Patient presents with  . Chest Pain  . Diarrhea  . Fatigue    HPI Michael Ramos is a 46 y.o. male with h/o autism who presents to the Emergency Department complaining of moderate upper and mid abdominal pain that began yesterday. Pt and family additionally complain of CP, cough, SOB, 8 episodes of diarrhea. Per family, the pt had a tick bite on his abdomen 2 days ago. She also reports the pt has been delayed with answering questions with his symptoms. No worsening or alleviating factors noted. Pt and family deny emesis, additional complaints.   The history is provided by the patient. No language interpreter was used.  Abdominal Pain   This is a new problem. The current episode started yesterday. The problem occurs constantly. The problem has been gradually worsening. The pain is associated with an unknown factor. The pain is located in the epigastric region and periumbilical region. The pain is moderate. Associated symptoms include diarrhea. Pertinent negatives include vomiting, frequency, hematuria and headaches. Nothing aggravates the symptoms. Nothing relieves the symptoms.    Past Medical History:  Diagnosis Date  . Allergy   . Autism   . Diabetes mellitus without complication [facility])     Patient Active Problem List   Diagnosis Date Noted  . Type 2 diabetes mellitus without complication, without long-term current use of insulin ([place]) 03/14/2016  . Chronic nonseasonal allergic rhinitis due to pollen 03/14/2016  . Autism 03/14/2016    History reviewed. No pertinent surgical history.     Home Medications    Prior to  Admission medications   Medication Sig Start Date End Date Taking? Authorizing Provider  loratadine (CLARITIN) 10 MG tablet Take 1 tablet (10 mg total) by mouth daily. 03/14/16  Yes Terald Sleeper, PA-C  metFORMIN (GLUCOPHAGE) 500 MG tablet TAKE ONE TABLET BY MOUTH TWICE DAILY 07/21/16  Yes Terald Sleeper, PA-C    Family History Family History  Problem Relation Age of Onset  . Diabetes Mother   . Cancer Father   . Diabetes Father     Social History Social History  Substance Use Topics  . Smoking status: Never Smoker  . Smokeless tobacco: Never Used  . Alcohol use No     Allergies   Patient has no known allergies.   Review of Systems Review of Systems  Constitutional: Negative for appetite change and fatigue.  HENT: Negative for congestion, ear discharge and sinus pressure.   Eyes: Negative for discharge.  Respiratory: Positive for cough and shortness of breath.   Cardiovascular: Positive for chest pain.  Gastrointestinal: Positive for abdominal pain and diarrhea. Negative for vomiting.  Genitourinary: Negative for frequency and hematuria.  Musculoskeletal: Negative for back pain.  Skin: Negative for rash.  Neurological: Negative for seizures and headaches.  Psychiatric/Behavioral: Negative for hallucinations.     Physical Exam Updated Vital Signs Pulse (!) 103   Temp 97.6 F (36.4 C) (Oral)   Resp 18   Ht 6\' 3"  (1.905 m)   Wt 283 lb 14.4 oz (128.8 kg)   SpO2 98%   BMI 35.49 kg/m   Physical Exam  Constitutional: He  is oriented to person, place, and time. He appears well-developed.  HENT:  Head: Normocephalic.  Dry mucous membranes.   Eyes: Conjunctivae and EOM are normal. No scleral icterus.  Neck: Neck supple. No thyromegaly present.  Cardiovascular: Normal rate and regular rhythm.  Exam reveals no gallop and no friction rub.   No murmur heard. Pulmonary/Chest: No stridor. He has no wheezes. He has no rales. He exhibits no tenderness.  Abdominal: He  exhibits no distension. There is no tenderness. There is no rebound.  Musculoskeletal: Normal range of motion. He exhibits no edema.  Lymphadenopathy:    He has no cervical adenopathy.  Neurological: He is oriented to person, place, and time. He exhibits normal muscle tone. Coordination normal.  Seems lethargic.   Skin: Rash noted. No erythema.  Small rash to the left lower abdomen from a tick bite.   Psychiatric: He has a normal mood and affect. His behavior is normal.  Nursing note and vitals reviewed.    ED Treatments / Results   DIAGNOSTIC STUDIES: Oxygen Saturation is 98% on RA, normal by my interpretation.    COORDINATION OF CARE: 12:10 PM Discussed treatment plan with pt at bedside which includes labs, CXR and pt agreed to plan.    Labs (all labs ordered are listed, but only abnormal results are displayed) Labs Reviewed  BASIC METABOLIC PANEL - Abnormal; Notable for the following:       Result Value   Glucose, Bld 143 (*)    Creatinine, Ser 1.27 (*)    All other components within normal limits  CBC - Abnormal; Notable for the following:    WBC 11.0 (*)    All other components within normal limits  TROPONIN I    EKG  EKG Interpretation  Date/Time:  Tuesday Aug 15 2016 11:03:41 EDT Ventricular Rate:  109 PR Interval:  134 QRS Duration: 74 QT Interval:  344 QTC Calculation: 463 R Axis:   64 Text Interpretation:  Sinus tachycardia with Premature supraventricular complexes Otherwise normal ECG No old tracing to compare Confirmed by Winfred Leeds  MD, SAM [phone]) on 08/15/2016 11:10:52 AM       Radiology Dg Chest 2 View  Result Date: 08/15/2016 CLINICAL DATA:  Chest pain EXAM: CHEST  2 VIEW COMPARISON:  None. FINDINGS: Small right pleural effusion with mild right lower lobe airspace disease. Left lung is clear. Negative for heart failure or edema. Heart size upper normal. No mass lesion identified. IMPRESSION: Mild right lower lobe airspace disease and small right  effusion. Differential diagnosis includes infection and pulmonary embolism. Neoplasm also in the differential. Electronically Signed   By: Franchot Gallo M.D.   On: 08/15/2016 11:47    Procedures Procedures (including critical care time)  Medications Ordered in ED Medications - No data to display   Initial Impression / Assessment and Plan / ED Course  I have reviewed the triage vital signs and the nursing notes.  Pertinent labs & imaging results that were available during my care of the patient were reviewed by me and considered in my medical decision making (see chart for details).     Patient with hypoxia and dyspnea. CT scan shows some pleural effusions possible pneumonia. Patient will be seen by hospitalist  Final Clinical Impressions(s) / ED Diagnoses   Final diagnoses:  None    New Prescriptions New Prescriptions   No medications on file  The chart was scribed for me under my direct supervision.  I personally performed the history, physical, and medical  decision making and all procedures in the evaluation of this patient..   The chart was scribed for me under my direct supervision.  I personally performed the history, physical, and medical decision making and all procedures in the evaluation of this patient.Milton Ferguson, MD 08/15/16 [phone]

## 2016-08-15 NOTE — ED Notes (Signed)
Report given to Val, RN for room 337 (tele) at this time.

## 2016-08-16 ENCOUNTER — Observation Stay (HOSPITAL_BASED_OUTPATIENT_CLINIC_OR_DEPARTMENT_OTHER): Payer: Medicare Other

## 2016-08-16 ENCOUNTER — Encounter (HOSPITAL_COMMUNITY): Payer: Self-pay

## 2016-08-16 ENCOUNTER — Observation Stay (HOSPITAL_COMMUNITY): Payer: Medicare Other

## 2016-08-16 DIAGNOSIS — F840 Autistic disorder: Secondary | ICD-10-CM | POA: Diagnosis present

## 2016-08-16 DIAGNOSIS — I313 Pericardial effusion (noninflammatory): Secondary | ICD-10-CM | POA: Diagnosis present

## 2016-08-16 DIAGNOSIS — Z8249 Family history of ischemic heart disease and other diseases of the circulatory system: Secondary | ICD-10-CM | POA: Diagnosis not present

## 2016-08-16 DIAGNOSIS — W57XXXA Bitten or stung by nonvenomous insect and other nonvenomous arthropods, initial encounter: Secondary | ICD-10-CM | POA: Diagnosis present

## 2016-08-16 DIAGNOSIS — E119 Type 2 diabetes mellitus without complications: Secondary | ICD-10-CM | POA: Diagnosis not present

## 2016-08-16 DIAGNOSIS — I5021 Acute systolic (congestive) heart failure: Principal | ICD-10-CM

## 2016-08-16 DIAGNOSIS — N179 Acute kidney failure, unspecified: Secondary | ICD-10-CM | POA: Diagnosis present

## 2016-08-16 DIAGNOSIS — E669 Obesity, unspecified: Secondary | ICD-10-CM | POA: Diagnosis present

## 2016-08-16 DIAGNOSIS — R0600 Dyspnea, unspecified: Secondary | ICD-10-CM | POA: Diagnosis not present

## 2016-08-16 DIAGNOSIS — J90 Pleural effusion, not elsewhere classified: Secondary | ICD-10-CM | POA: Diagnosis not present

## 2016-08-16 DIAGNOSIS — I342 Nonrheumatic mitral (valve) stenosis: Secondary | ICD-10-CM

## 2016-08-16 DIAGNOSIS — T501X5A Adverse effect of loop [high-ceiling] diuretics, initial encounter: Secondary | ICD-10-CM | POA: Diagnosis present

## 2016-08-16 DIAGNOSIS — Z7984 Long term (current) use of oral hypoglycemic drugs: Secondary | ICD-10-CM | POA: Diagnosis not present

## 2016-08-16 DIAGNOSIS — Z6834 Body mass index (BMI) 34.0-34.9, adult: Secondary | ICD-10-CM | POA: Diagnosis not present

## 2016-08-16 DIAGNOSIS — R0602 Shortness of breath: Secondary | ICD-10-CM | POA: Diagnosis not present

## 2016-08-16 DIAGNOSIS — S30861A Insect bite (nonvenomous) of abdominal wall, initial encounter: Secondary | ICD-10-CM | POA: Diagnosis present

## 2016-08-16 DIAGNOSIS — I493 Ventricular premature depolarization: Secondary | ICD-10-CM | POA: Diagnosis present

## 2016-08-16 LAB — ECHOCARDIOGRAM COMPLETE
Height: 75 in
Weight: 4555.2 oz

## 2016-08-16 LAB — GLUCOSE, CAPILLARY
GLUCOSE-CAPILLARY: 142 mg/dL — AB (ref 65–99)
GLUCOSE-CAPILLARY: 165 mg/dL — AB (ref 65–99)
Glucose-Capillary: 151 mg/dL — ABNORMAL HIGH (ref 65–99)

## 2016-08-16 LAB — BASIC METABOLIC PANEL
ANION GAP: 7 (ref 5–15)
BUN: 13 mg/dL (ref 6–20)
CALCIUM: 8.4 mg/dL — AB (ref 8.9–10.3)
CO2: 26 mmol/L (ref 22–32)
CREATININE: 1.4 mg/dL — AB (ref 0.61–1.24)
Chloride: 105 mmol/L (ref 101–111)
GFR, EST NON AFRICAN AMERICAN: 60 mL/min — AB (ref >60)
Glucose, Bld: 140 mg/dL — ABNORMAL HIGH (ref 65–99)
Potassium: 4.1 mmol/L (ref 3.5–5.1)
SODIUM: 138 mmol/L (ref 135–145)

## 2016-08-16 LAB — CBC
HCT: 44.5 % (ref 39.0–52.0)
HEMOGLOBIN: 14.4 g/dL (ref 13.0–17.0)
MCH: 28.7 pg (ref 26.0–34.0)
MCHC: 32.4 g/dL (ref 30.0–36.0)
MCV: 88.6 fL (ref 78.0–100.0)
PLATELETS: 196 10*3/uL (ref 150–400)
RBC: 5.02 MIL/uL (ref 4.22–5.81)
RDW: 13.9 % (ref 11.5–15.5)
WBC: 9.4 10*3/uL (ref 4.0–10.5)

## 2016-08-16 LAB — HIV ANTIBODY (ROUTINE TESTING W REFLEX): HIV SCREEN 4TH GENERATION: NONREACTIVE

## 2016-08-16 LAB — TSH: TSH: 1.671 u[IU]/mL (ref 0.350–4.500)

## 2016-08-16 LAB — TROPONIN I

## 2016-08-16 LAB — BRAIN NATRIURETIC PEPTIDE: B Natriuretic Peptide: 204 pg/mL — ABNORMAL HIGH (ref 0.0–100.0)

## 2016-08-16 MED ORDER — CARVEDILOL 3.125 MG PO TABS
3.1250 mg | ORAL_TABLET | Freq: Two times a day (BID) | ORAL | Status: DC
  Administered 2016-08-16 – 2016-08-17 (×2): 3.125 mg via ORAL
  Filled 2016-08-16 (×2): qty 1

## 2016-08-16 MED ORDER — INSULIN ASPART 100 UNIT/ML ~~LOC~~ SOLN
0.0000 [IU] | Freq: Three times a day (TID) | SUBCUTANEOUS | Status: DC
  Administered 2016-08-16 – 2016-08-17 (×3): 2 [IU] via SUBCUTANEOUS
  Administered 2016-08-18 – 2016-08-19 (×3): 1 [IU] via SUBCUTANEOUS

## 2016-08-16 MED ORDER — FUROSEMIDE 10 MG/ML IJ SOLN
40.0000 mg | Freq: Two times a day (BID) | INTRAMUSCULAR | Status: DC
  Administered 2016-08-16 – 2016-08-17 (×2): 40 mg via INTRAVENOUS
  Filled 2016-08-16 (×2): qty 4

## 2016-08-16 MED ORDER — SODIUM CHLORIDE 0.9 % IN NEBU
INHALATION_SOLUTION | RESPIRATORY_TRACT | Status: AC
  Administered 2016-08-16: 3 mL
  Filled 2016-08-16: qty 3

## 2016-08-16 MED ORDER — CARVEDILOL 3.125 MG PO TABS: 6.2500 mg | ORAL_TABLET | Freq: Two times a day (BID) | ORAL | Status: DC

## 2016-08-16 MED ORDER — DOXYCYCLINE HYCLATE 100 MG PO TABS
100.0000 mg | ORAL_TABLET | Freq: Two times a day (BID) | ORAL | Status: DC
  Administered 2016-08-16 – 2016-08-19 (×7): 100 mg via ORAL
  Filled 2016-08-16 (×7): qty 1

## 2016-08-16 NOTE — Care Management Note (Signed)
Case Management Note  Patient Details  Name: BRIA PORTALES MRN: [id number] Date of Birth: [birth year]  Subjective/Objective:                  Pt admiteed with SOB. Pt from home, lives with his mother. He is ind with ALD's, Has no DME or HH needs pta. He has PCP, transportation to appointments and insurance with drug coverage. Mother at bedside.   Action/Plan: Pt plans to return home with self care. No CM needs.   Expected Discharge Date:      08/16/2016            Expected Discharge Plan:  Home/Self Care  In-House Referral:  NA  Discharge planning Services  CM Consult  Post Acute Care Choice:  NA Choice offered to:  NA  Status of Service:  Completed, signed off  Sherald Barge, RN 08/16/2016, 9:40 AM

## 2016-08-16 NOTE — Care Management Obs Status (Signed)
[facility] NOTIFICATION   Patient Details  Name: Michael Ramos MRN: [id number] Date of Birth: 03/01/1971   Medicare Observation Status Notification Given:  Yes    Sherald Barge, RN 08/16/2016, 9:21 AM

## 2016-08-16 NOTE — Progress Notes (Signed)
PROGRESS NOTE                                                                                                                                                                                                             Patient Demographics:    Michael Ramos, is a 46 y.o. male, DOB - 20-May-1970, XBW:[id number]  Admit date - 08/15/2016   Admitting Physician Eber Jones, MD  Outpatient Primary MD for the patient is Terald Sleeper, PA-C  LOS - 0  Chief Complaint  Patient presents with  . Chest Pain  . Diarrhea  . Fatigue       Brief Narrative   46 y.o. male with medical history significant of autism/ Aspereger's Presents with mother for complaints of chest pain, fatigue, shortness of breath and diarrhea, workup significant for bilateral pleural effusion, 2-D echo significant of EF 20%.   Subjective:    Michael Ramos today Reports some shortness of breath when he lays flat, currently denies any chest pain, reports some cough, denies any fever or chills, no further diarrhea, no nausea or vomiting .    Assessment  & Plan :    Principal Problem:   Shortness of breath Active Problems:   Type 2 diabetes mellitus without complication, without long-term current use of insulin (HCC)   Autism  Acute systolic CHF - Patient presents with dyspnea, orthopnea, CT chest with evidence of bilateral pleural effusion, the echo with evidence of EF 20-25%, with diffuse hypokinesis. 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 - Discussed with cardiology, unlikely related endocarditis/myocarditis as negative troponins and stable blood pressure, patient  with recent tick bite. - We'll start on IV Lasix 40 mg twice a day, will monitor closely as creatinine 1.4,  will check daily weights, strict ins and outs, will await cardiology recommendation before initiation beta blocker given he is in acute compensated CHF, will need ACEI along his renal function remained stable on diuresis. - Cardiology consulted  Bilateral pleural effusion - She is related to CHF, discussed with radiology ,unable to drain, will monitor closely, will check x-ray in 1-2 days after diuresis  Diabetes mellitus - Hold metformin, continue with insulin sliding scale  Tick bite - Report tick bite 4-5 days, erythema not typical for erythema migrans(see photo below), but will start empiric doxycycline pending lyme (continue with doxycycline treatment even if negative as may be too early  for positive titer) and Rickettsia titer  Chest pain - Appears to be more related to cough, he has negative troponins 3, cardiology finding as above, cardiology on board   Code Status : Full  Family Communication  : Discussed with sister and mother at bedside  Disposition Plan  : Home when stable  Consults  :  Cardiology  Procedures  : None  DVT Prophylaxis  :  Lovenox - SCDs   Lab Results  Component Value Date   PLT 196 08/16/2016    Antibiotics  :   Anti-infectives    Start     Dose/Rate Route Frequency Ordered Stop   08/16/16 1030  doxycycline (VIBRA-TABS) tablet 100 mg     100 mg Oral Every 12 hours 08/16/16 1016     08/15/16 1300  levofloxacin (LEVAQUIN) IVPB 500 mg     500 mg 100 mL/hr over 60 Minutes Intravenous  Once 08/15/16 1255 08/15/16 1450        Objective:   Vitals:   08/16/16 0041 08/16/16 0624 08/16/16 1224 08/16/16 1300  BP: (!) 157/92 135/89  (!) 142/84  Pulse:  81  60  Resp:  20  20  Temp:  97.5 F (36.4 C)    TempSrc:  Oral    SpO2:  95% 97% 98%  Weight:      Height:        Wt Readings from Last 3 Encounters:  08/15/16 129.1 kg (284 lb 11.2 oz)  03/14/16 127.9 kg (282 lb)  09/22/13 136.1 kg (300 lb)     Intake/Output Summary (Last 24 hours)  at 08/16/16 1300 Last data filed at 08/15/16 1450  Gross per 24 hour  Intake             2100 ml  Output                0 ml  Net             2100 ml     Physical Exam  Awake Alert To person, able to answer simple questions Supple Neck, mild  JVD, Symmetrical chest wall movement bilaterally, Menest air entry bilaterally, but no respiratory distress or use of accessory muscles Tachycardic but regular,No Gallops,Rubs or new Murmurs, No Parasternal Heave +ve B.Sounds, Abd Soft, No tenderness,, No rebound - guarding or rigidity, mild erythema at left lower abdomen at the site of tick bite, does not appear typical for erythema migrans No Cyanosis, Clubbing or edema, No new Rash or bruise      Data Review:    CBC  Recent Labs Lab 08/15/16 1115 08/16/16 0536  WBC 11.0* 9.4  HGB 15.6 14.4  HCT 46.8 44.5  PLT 220 196  MCV 88.3 88.6  MCH 29.4 28.7  MCHC 33.3 32.4  RDW 13.6 13.9    Chemistries   Recent Labs Lab 08/15/16 1115 08/15/16 1744 08/16/16 0536  NA 139  --  138  K 4.5  --  4.1  CL 103  --  105  CO2 27  --  26  GLUCOSE 143*  --  140*  BUN 11  --  13  CREATININE 1.27*  --  1.40*  CALCIUM 9.0  --  8.4*  AST  --  30  --   ALT  --  51  --   ALKPHOS  --  32*  --   BILITOT  --  0.8  --    ------------------------------------------------------------------------------------------------------------------ No results for input(s): CHOL, HDL, LDLCALC, TRIG, CHOLHDL, LDLDIRECT in the last 72 hours.  No results found for: HGBA1C ------------------------------------------------------------------------------------------------------------------  Recent Labs  08/16/16 0536  TSH 1.671   ------------------------------------------------------------------------------------------------------------------ No results for input(s): VITAMINB12, FOLATE, FERRITIN, TIBC, IRON, RETICCTPCT in the last 72 hours.  Coagulation profile No results for input(s): INR, PROTIME in the last  168 hours.  No results for input(s): DDIMER in the last 72 hours.  Cardiac Enzymes  Recent Labs Lab 08/15/16 1738 08/15/16 2320 08/16/16 0536  TROPONINI <0.03 <0.03 <0.03   ------------------------------------------------------------------------------------------------------------------    Component Value Date/Time   BNP 204.0 (H) 08/16/2016 0536    Inpatient Medications  Scheduled Meds: . doxycycline  100 mg Oral Q12H  . enoxaparin (LOVENOX) injection  60 mg Subcutaneous Q24H  . furosemide  40 mg Intravenous Q12H   Continuous Infusions: PRN Meds:.acetaminophen **OR** acetaminophen, gi cocktail, guaiFENesin-dextromethorphan, levalbuterol  Micro Results Recent Results (from the past 240 hour(s))  Blood culture (routine x 2)     Status: None (Preliminary result)   Collection Time: 08/15/16  1:22 PM  Result Value Ref Range Status   Specimen Description BLOOD RIGHT ARM  Final   Special Requests   Final    BOTTLES DRAWN AEROBIC AND ANAEROBIC Blood Culture adequate volume   Culture NO GROWTH < 24 HOURS  Final   Report Status PENDING  Incomplete  Blood culture (routine x 2)     Status: None (Preliminary result)   Collection Time: 08/15/16  1:29 PM  Result Value Ref Range Status   Specimen Description BLOOD RIGHT HAND  Final   Special Requests   Final    BOTTLES DRAWN AEROBIC ONLY Blood Culture results may not be optimal due to an inadequate volume of blood received in culture bottles   Culture NO GROWTH < 24 HOURS  Final   Report Status PENDING  Incomplete    Radiology Reports Dg Chest 2 View  Result Date: 08/15/2016 CLINICAL DATA:  Chest pain EXAM: CHEST  2 VIEW COMPARISON:  None. FINDINGS: Small right pleural effusion with mild right lower lobe airspace disease. Left lung is clear. Negative for heart failure or edema. Heart size upper normal. No mass lesion identified. IMPRESSION: Mild right lower lobe airspace disease and small right effusion. Differential diagnosis  includes infection and pulmonary embolism. Neoplasm also in the differential. Electronically Signed   By: Franchot Gallo M.D.   On: 08/15/2016 11:47   Ct Chest W Contrast  Result Date: 08/15/2016 CLINICAL DATA:  Cough since last night. EXAM: CT CHEST WITH CONTRAST TECHNIQUE: Multidetector CT imaging of the chest was performed during intravenous contrast administration. CONTRAST:  34mL ISOVUE-300 IOPAMIDOL (ISOVUE-300) INJECTION 61% COMPARISON:  None. FINDINGS: Cardiovascular: No significant vascular findings. Normal heart size. No pericardial effusion. Normal caliber thoracic aorta. No thoracic aortic dissection. Mediastinum/Nodes: No enlarged mediastinal, hilar, or axillary lymph nodes. Thyroid gland, trachea, and esophagus demonstrate no significant findings. Lungs/Pleura: Moderate right and small left pleural effusion. Bibasilar dependent airspace disease likely reflecting atelectasis versus less likely pneumonia. No pneumothorax. Mild bilateral interstitial thickening. Upper Abdomen: No acute upper abdominal abnormality. Musculoskeletal: No acute osseous abnormality. No lytic or sclerotic osseous lesion. IMPRESSION: 1. Moderate right and small left pleural effusion. Bibasilar dependent airspace disease likely reflecting atelectasis versus less likely pneumonia. Bilateral mild interstitial thickening suggesting an element of mild pulmonary edema.  Electronically Signed   By: Kathreen Devoid   On: 08/15/2016 13:32     [facility], Yaira Bernardi M.D on 08/16/2016 at 1:00 PM  Between 7am to 7pm - Pager - [phone]  After 7pm go to [email or web address] - password [facility]  [facility] -  Office  [phone]

## 2016-08-16 NOTE — Progress Notes (Signed)
*  PRELIMINARY RESULTS* Echocardiogram 2D Echocardiogram has been performed.  Leavy Cella 08/16/2016, 11:08 AM

## 2016-08-16 NOTE — Consult Note (Signed)
Cardiology Consultation   Patient ID: Michael Ramos; [id number]; 1971-02-16   Admit date: 08/15/2016 Date of Consult: 08/16/2016  Referring MD: Dr. Waldron Labs Cardiologist:New Consulting Cardiologist:Dr. Harl Bowie  Patient Care Team: Theodoro Clock as PCP - General (Physician Assistant)    Reason for Consultation: chest pain/CHF   History of Present Illness: Michael Ramos is a 46 y.o. male with a hx of autism and diabetes was brought to the emergency room by his mother because of chest pain and abdominal pain throughout the night. He couldn't lay flat because he was so short of breath. CT showed moderate right and small left pleural effusion and possible pneumonia and mild pulmonary edema. EKG sinus tachycardia with poor R-wave progression anteriorly. BNP 204 troponins negative 4 creatinine 1.4. 2-D echo shows severely reduced LV function EF 20-25% with diffuse hypokinesis as well as diastolic dysfunction, mild MR and small circumferential pericardial effusion with large left pleural effusion. No echo to compare to.  Patient is autistic and can't give any history. All his history is given by his sister and mother who is on speaker phone. Diagnosed with diabetes 6 months ago. Before that completely healthy. He became short of breath 3 days ago. He was able to mow the lawn and walk without difficulty. ER notes say he was having chest and abdominal pain but now they are saying he didn't have chest pain. No recent viral illness. Patient eats a lot of spam, bologna & fast food. Mother with CHF. Had cough and diarrhea a couple of days before symptoms. Also tick bite.  Past Medical History:  Diagnosis Date  . Allergy   . Autism   . Diabetes mellitus without complication ([place])     History reviewed. No pertinent surgical history.    Home Meds: Prior to Admission medications   Medication Sig Start Date End Date Taking? Authorizing Provider  loratadine (CLARITIN) 10 MG tablet Take 1  tablet (10 mg total) by mouth daily. 03/14/16  Yes Terald Sleeper, PA-C  metFORMIN (GLUCOPHAGE) 500 MG tablet TAKE ONE TABLET BY MOUTH TWICE DAILY 07/21/16  Yes Terald Sleeper, PA-C    Current Medications: . doxycycline  100 mg Oral Q12H  . enoxaparin (LOVENOX) injection  60 mg Subcutaneous Q24H  . furosemide  40 mg Intravenous Q12H  . insulin aspart  0-9 Units Subcutaneous TID WC     Allergies:   No Known Allergies  Social History:   The patient  reports that he has never smoked. He has never used smokeless tobacco. He reports that he does not drink alcohol or use drugs.    Family History:   The patient's family history includes Cancer in his father; Diabetes in his father and mother.   ROS:  Please see the history of present illness.  Review of Systems  Reason unable to perform ROS: patient is autistic and can't give any reliable history. All history given by sister.  Cardiovascular: Positive for dyspnea on exertion and leg swelling.  Respiratory: Positive for shortness of breath and sleep disturbances due to breathing.    All other ROS reviewed and negative.      Vital Signs: Blood pressure (!) 142/84, pulse 60, temperature 97.5 F (36.4 C), temperature source Oral, resp. rate 20, height 6\' 3"  (1.905 m), weight 284 lb 11.2 oz (129.1 kg), SpO2 98 %.   PHYSICAL EXAM: General:  Obese, in no acute distress  HEENT: normal Lymph: no adenopathy Neck: no JVD Endocrine:  No thryomegaly Vascular: No  carotid bruits; FA pulses 2+ bilaterally without bruits  Cardiac:  RRR; normal S1, S2; no murmur, rub, bruit, thrill, or heave Lungs:  Decreased breath sounds with fine crackles  Abd: soft, nontender, no hepatomegaly  Ext: no edema, Good distal pulses bilaterally Musculoskeletal:  No deformities, BUE and BLE strength normal and equal Skin: warm and dry  Neuro:  CNs 2-12 intact, no focal abnormalities noted Psych:  Normal affect    EKG:  Sinus tachycardia with poor R-wave progression  anteriorly personally reviewed  Telemetry: Sinus tachycardia personally reviewed  Labs:  Recent Labs  08/15/16 1115 08/15/16 1738 08/15/16 2320 08/16/16 0536  TROPONINI <0.03 <0.03 <0.03 <0.03   Lab Results  Component Value Date   WBC 9.4 08/16/2016   HGB 14.4 08/16/2016   HCT 44.5 08/16/2016   MCV 88.6 08/16/2016   PLT 196 08/16/2016    Recent Labs Lab 08/15/16 1744 08/16/16 0536  NA  --  138  K  --  4.1  CL  --  105  CO2  --  26  BUN  --  13  CREATININE  --  1.40*  CALCIUM  --  8.4*  PROT 7.3  --   BILITOT 0.8  --   ALKPHOS 32*  --   ALT 51  --   AST 30  --   GLUCOSE  --  140*   Lab Results  Component Value Date   CHOL 126 03/14/2016   HDL 47 03/14/2016   LDLCALC 42 03/14/2016   TRIG 187 (H) 03/14/2016   No results found for: DDIMER  Radiology/Studies:  Dg Chest 2 View  Result Date: 08/15/2016 CLINICAL DATA:  Chest pain EXAM: CHEST  2 VIEW COMPARISON:  None. FINDINGS: Small right pleural effusion with mild right lower lobe airspace disease. Left lung is clear. Negative for heart failure or edema. Heart size upper normal. No mass lesion identified. IMPRESSION: Mild right lower lobe airspace disease and small right effusion. Differential diagnosis includes infection and pulmonary embolism. Neoplasm also in the differential. Electronically Signed   By: Franchot Gallo M.D.   On: 08/15/2016 11:47   Ct Chest W Contrast  Result Date: 08/15/2016 CLINICAL DATA:  Cough since last night. EXAM: CT CHEST WITH CONTRAST TECHNIQUE: Multidetector CT imaging of the chest was performed during intravenous contrast administration. CONTRAST:  35mL ISOVUE-300 IOPAMIDOL (ISOVUE-300) INJECTION 61% COMPARISON:  None. FINDINGS: Cardiovascular: No significant vascular findings. Normal heart size. No pericardial effusion. Normal caliber thoracic aorta. No thoracic aortic dissection. Mediastinum/Nodes: No enlarged mediastinal, hilar, or axillary lymph nodes. Thyroid gland, trachea, and  esophagus demonstrate no significant findings. Lungs/Pleura: Moderate right and small left pleural effusion. Bibasilar dependent airspace disease likely reflecting atelectasis versus less likely pneumonia. No pneumothorax. Mild bilateral interstitial thickening. Upper Abdomen: No acute upper abdominal abnormality. Musculoskeletal: No acute osseous abnormality. No lytic or sclerotic osseous lesion. IMPRESSION: 1. Moderate right and small left pleural effusion. Bibasilar dependent airspace disease likely reflecting atelectasis versus less likely pneumonia. Bilateral mild interstitial thickening suggesting an element of mild pulmonary edema. Electronically Signed   By: Kathreen Devoid   On: 08/15/2016 13:32   [place] Chest  Result Date: 08/16/2016 CLINICAL DATA:  Pleural effusions EXAM: CHEST ULTRASOUND COMPARISON:  CT chest 08/15/2016 FINDINGS: Small BILATERAL pleural effusions. Small sizes of these collections make these unlikely to expect significant symptomatic improvement following thoracentesis. Thoracentesis not performed at this time. IMPRESSION: Small BILATERAL pleural effusions. Thoracentesis not performed at this time. Electronically Signed   By: Lavonia Dana  M.D.   On: 08/16/2016 13:27   2-D echo 08/15/16 Study Conclusions   - Left ventricle: The cavity size was mildly dilated. Wall   thickness was normal. Systolic function was severely reduced. The   estimated ejection fraction was in the range of 20% to 25%.   Diffuse hypokinesis. Diastolic function is abnormal,   indeterminant grade. - Aortic valve: Valve area (VTI): 1.98 cm^2. Valve area (Vmax):   2.01 cm^2. - Mitral valve: There was mild regurgitation. - Inferior vena cava: The vessel was dilated. The respirophasic   diameter changes were blunted (< 50%), consistent with elevated   central venous pressure. - Pericardium, extracardiac: There is a small circumferential   pericardial effusion. There is no evidence of tamponade   physiology by  echo. Large left pleural effusion. - Technically difficult study.  PROBLEM LIST:  Principal Problem:   Shortness of breath Active Problems:   Type 2 diabetes mellitus without complication, without long-term current use of insulin (HCC)   Autism     ASSESSMENT AND PLAN:  Acute systolic and diastolic CHF LVEF 17-79% on 2-D echo with diffuse hypokinesis. Symptoms began 3 days ago. No history of angina. ? Viral with Cough and diarrhea and recent tick bite.   possible pneumonia on CT. Recommend diuresis. On Lasix 40 mg IV twice a day, 2 g sodium diet, hold off on ACE inhibitor at this time with creatinine of 1.4. Discuss  possible ischemic workup in the future. Coreg for Sinus tachycardia and elevated BP.  Chest pain: EKG without acute ischemic changes and troponins negative 4  Diabetes mellitus on metformin  Autism  Recent Tick bite on Doxycyline Signed, Ermalinda Barrios, PA-C  08/16/2016 2:28 PM   Attending note 46 yo male with history of autism presents with chest pain/abdominal pain, cough and SOB. Evidence of fluid overload and congestive HF. Echo shows LVEF 20-25%, a new finding for the patient.  Unclear etiology, he has had recent viral like illness. Presented with chest pain but no evidence of acute ischemia. We will start low dose coreg given his supraventricular and ventricular, we will follow if tolerated given his active decompensation. Tele irregular at times, looks to be sinus with low P-wave amplitude and PACs as opposed to afib. Will repeat EKG.  With Cr up to 1.4 will not start ACE-I at this time, follow with diuresis. If bp remains elevated would consider hydral/nitrates. He is on lasix IV 40mg  bid, I/Os are incomplete, reordered. F/u HIV, TSH. From possible  postviral CM standpoint very hard to diagnose even with serologies given ubiquitous exposure to the common causative viruses, though will f/u panel that has been ordered. Will need ischemic evaluation in the near future,  depending clinical course over next few days will dictate whether inpatient or outpatient   Carlyle Dolly MD

## 2016-08-17 LAB — CBC
HCT: 46.4 % (ref 39.0–52.0)
Hemoglobin: 15.3 g/dL (ref 13.0–17.0)
MCH: 29 pg (ref 26.0–34.0)
MCHC: 33 g/dL (ref 30.0–36.0)
MCV: 88 fL (ref 78.0–100.0)
PLATELETS: 235 10*3/uL (ref 150–400)
RBC: 5.27 MIL/uL (ref 4.22–5.81)
RDW: 13.9 % (ref 11.5–15.5)
WBC: 12.6 10*3/uL — AB (ref 4.0–10.5)

## 2016-08-17 LAB — MAGNESIUM: MAGNESIUM: 1.9 mg/dL (ref 1.7–2.4)

## 2016-08-17 LAB — B. BURGDORFI ANTIBODIES

## 2016-08-17 LAB — GLUCOSE, CAPILLARY
GLUCOSE-CAPILLARY: 161 mg/dL — AB (ref 65–99)
GLUCOSE-CAPILLARY: 157 mg/dL — AB (ref 65–99)
Glucose-Capillary: 146 mg/dL — ABNORMAL HIGH (ref 65–99)
Glucose-Capillary: 119 mg/dL — ABNORMAL HIGH (ref 65–99)

## 2016-08-17 LAB — COMPREHENSIVE METABOLIC PANEL
ALBUMIN: 3.7 g/dL (ref 3.5–5.0)
ALT: 46 U/L (ref 17–63)
ANION GAP: 8 (ref 5–15)
AST: 32 U/L (ref 15–41)
Alkaline Phosphatase: 28 U/L — ABNORMAL LOW (ref 38–126)
BILIRUBIN TOTAL: 1.3 mg/dL — AB (ref 0.3–1.2)
BUN: 15 mg/dL (ref 6–20)
CHLORIDE: 103 mmol/L (ref 101–111)
CO2: 27 mmol/L (ref 22–32)
Calcium: 8.9 mg/dL (ref 8.9–10.3)
Creatinine, Ser: 1.39 mg/dL — ABNORMAL HIGH (ref 0.61–1.24)
GFR calc Af Amer: >60 mL/min (ref >60)
GFR calc non Af Amer: >60 mL/min (ref >60)
GLUCOSE: 151 mg/dL — AB (ref 65–99)
POTASSIUM: 3.9 mmol/L (ref 3.5–5.1)
Sodium: 138 mmol/L (ref 135–145)
TOTAL PROTEIN: 7 g/dL (ref 6.5–8.1)

## 2016-08-17 LAB — CMV IGM: CMV IgM: <30 AU/mL (ref 0.0–29.9)

## 2016-08-17 LAB — CMV ANTIBODY, IGG (EIA)

## 2016-08-17 MED ORDER — LISINOPRIL 5 MG PO TABS
2.5000 mg | ORAL_TABLET | Freq: Every day | ORAL | Status: DC
  Administered 2016-08-17 – 2016-08-19 (×3): 2.5 mg via ORAL
  Filled 2016-08-17 (×3): qty 1

## 2016-08-17 MED ORDER — CARVEDILOL 3.125 MG PO TABS
6.2500 mg | ORAL_TABLET | Freq: Two times a day (BID) | ORAL | Status: DC
  Administered 2016-08-17 – 2016-08-19 (×4): 6.25 mg via ORAL
  Filled 2016-08-17 (×4): qty 2

## 2016-08-17 MED ORDER — FUROSEMIDE 10 MG/ML IJ SOLN
40.0000 mg | Freq: Three times a day (TID) | INTRAMUSCULAR | Status: DC
  Administered 2016-08-17 – 2016-08-18 (×3): 40 mg via INTRAVENOUS
  Filled 2016-08-17 (×3): qty 4

## 2016-08-17 NOTE — Progress Notes (Signed)
Progress Note  Patient Name: Michael Ramos Date of Encounter: 08/17/2016   Subjective   Some SOB overnight.   Inpatient Medications    Scheduled Meds: . carvedilol  3.125 mg Oral BID WC  . doxycycline  100 mg Oral Q12H  . enoxaparin (LOVENOX) injection  60 mg Subcutaneous Q24H  . furosemide  40 mg Intravenous Q12H  . insulin aspart  0-9 Units Subcutaneous TID WC   Continuous Infusions:  PRN Meds: acetaminophen **OR** acetaminophen, gi cocktail, guaiFENesin-dextromethorphan, levalbuterol   Vital Signs    Vitals:   08/16/16 2024 08/16/16 2131 08/17/16 0500 08/17/16 0622  BP:  (!) 131/95  (!) 125/100  Pulse:  (!) 109  (!) 53  Resp:  18  18  Temp:  97.6 F (36.4 C)  97.5 F (36.4 C)  TempSrc:  Oral  Oral  SpO2: 96% 96%  97%  Weight:   281 lb 7 oz (127.7 kg)   Height:        Intake/Output Summary (Last 24 hours) at 08/17/16 0852 Last data filed at 08/17/16 0844  Gross per 24 hour  Intake              600 ml  Output             1000 ml  Net             -400 ml   Filed Weights   08/15/16 1059 08/15/16 1751 08/17/16 0500  Weight: 283 lb 14.4 oz (128.8 kg) 284 lb 11.2 oz (129.1 kg) 281 lb 7 oz (127.7 kg)    Telemetry    SR and sinus tach, PVCs - Personally Reviewed  ECG     Physical Exam   GEN: No acute distress.   Neck: No JVD Cardiac: RRR, no murmurs, rubs, or gallops.  Respiratory: decreased breath sounds bilateral bases GI: Soft, nontender, non-distended  MS: No edema; No deformity. Neuro:  Nonfocal  Psych: Normal affect   Labs    Chemistry Recent Labs Lab 08/15/16 1115 08/15/16 1744 08/16/16 0536 08/17/16 0551  NA 139  --  138 138  K 4.5  --  4.1 3.9  CL 103  --  105 103  CO2 27  --  26 27  GLUCOSE 143*  --  140* 151*  BUN 11  --  13 15  CREATININE 1.27*  --  1.40* 1.39*  CALCIUM 9.0  --  8.4* 8.9  PROT  --  7.3  --  7.0  ALBUMIN  --  3.9  --  3.7  AST  --  30  --  32  ALT  --  51  --  46  ALKPHOS  --  32*  --  28*  BILITOT   --  0.8  --  1.3*  GFRNONAA >60  --  60* >60  GFRAA >60  --  >60 >60  ANIONGAP 9  --  7 8     Hematology Recent Labs Lab 08/15/16 1115 08/16/16 0536 08/17/16 0551  WBC 11.0* 9.4 12.6*  RBC 5.30 5.02 5.27  HGB 15.6 14.4 15.3  HCT 46.8 44.5 46.4  MCV 88.3 88.6 88.0  MCH 29.4 28.7 29.0  MCHC 33.3 32.4 33.0  RDW 13.6 13.9 13.9  PLT 220 196 235    Cardiac Enzymes Recent Labs Lab 08/15/16 1115 08/15/16 1738 08/15/16 2320 08/16/16 0536  TROPONINI <0.03 <0.03 <0.03 <0.03   No results for input(s): TROPIPOC in the last 168 hours.   BNP  Recent Labs Lab 08/15/16 1115 08/16/16 0536  BNP 194.0* 204.0*     DDimer No results for input(s): DDIMER in the last 168 hours.   Radiology    Dg Chest 2 View  Result Date: 08/15/2016 CLINICAL DATA:  Chest pain EXAM: CHEST  2 VIEW COMPARISON:  None. FINDINGS: Small right pleural effusion with mild right lower lobe airspace disease. Left lung is clear. Negative for heart failure or edema. Heart size upper normal. No mass lesion identified. IMPRESSION: Mild right lower lobe airspace disease and small right effusion. Differential diagnosis includes infection and pulmonary embolism. Neoplasm also in the differential. Electronically Signed   By: Franchot Gallo M.D.   On: 08/15/2016 11:47   Ct Chest W Contrast  Result Date: 08/15/2016 CLINICAL DATA:  Cough since last night. EXAM: CT CHEST WITH CONTRAST TECHNIQUE: Multidetector CT imaging of the chest was performed during intravenous contrast administration. CONTRAST:  35mL ISOVUE-300 IOPAMIDOL (ISOVUE-300) INJECTION 61% COMPARISON:  None. FINDINGS: Cardiovascular: No significant vascular findings. Normal heart size. No pericardial effusion. Normal caliber thoracic aorta. No thoracic aortic dissection. Mediastinum/Nodes: No enlarged mediastinal, hilar, or axillary lymph nodes. Thyroid gland, trachea, and esophagus demonstrate no significant findings. Lungs/Pleura: Moderate right and small left  pleural effusion. Bibasilar dependent airspace disease likely reflecting atelectasis versus less likely pneumonia. No pneumothorax. Mild bilateral interstitial thickening. Upper Abdomen: No acute upper abdominal abnormality. Musculoskeletal: No acute osseous abnormality. No lytic or sclerotic osseous lesion. IMPRESSION: 1. Moderate right and small left pleural effusion. Bibasilar dependent airspace disease likely reflecting atelectasis versus less likely pneumonia. Bilateral mild interstitial thickening suggesting an element of mild pulmonary edema. Electronically Signed   By: Kathreen Devoid   On: 08/15/2016 13:32   [place] Chest  Result Date: 08/16/2016 CLINICAL DATA:  Pleural effusions EXAM: CHEST ULTRASOUND COMPARISON:  CT chest 08/15/2016 FINDINGS: Small BILATERAL pleural effusions. Small sizes of these collections make these unlikely to expect significant symptomatic improvement following thoracentesis. Thoracentesis not performed at this time. IMPRESSION: Small BILATERAL pleural effusions. Thoracentesis not performed at this time. Electronically Signed   By: Lavonia Dana M.D.   On: 08/16/2016 13:27    Cardiac Studies    Patient Profile     46 yo male with history of autism presents with chest pain/abdominal pain, cough and SOB diagnosed with new onset acute systolic HF.   Assessment & Plan    1. Acute systolic HF - echo LVEF 28-78%, global hypokinesis, abnormal diastolic function indeterminant grade - I/Os are incomplete. He is on lasix IV 40mg  bid. SOB improving. Renal function fairly stable. - started on coreag 3.125mg  bid, mild renal dysfunction, we will start low dose ACE-I lisionpril 2.5mg  daily. If tolerates, overtime can consider changing to entresto - normal TSH, HIV neg. Recent tick bite, serologies pending though I think less likely etiology. 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  - likely plan for outpatient cath, in absence of evidence of acute ischemia.  - continue IV diuretics todays  2.  PVCs - occasional, no NSVT or VT - will add on Mg lab - will increase coreg to 6.25mg  bid in setting of ventricular ectopy. - dynamap heart rates likely inaccurate due to PVCs, tele shows rates steady around 100   Signed, Carlyle Dolly, MD  08/17/2016, 8:52 AM

## 2016-08-17 NOTE — Progress Notes (Signed)
PROGRESS NOTE                                                                                                                                                                                                             Patient Demographics:    Michael Ramos, is a 46 y.o. male, DOB - 1971/03/04, IZT:[id number]  Admit date - 08/15/2016   Admitting Physician Eber Jones, MD  Outpatient Primary MD for the patient is Theodoro Clock  LOS - 1  Chief Complaint  Patient presents with  . Chest Pain  . Diarrhea  . Fatigue       Brief Narrative   46 y.o. male with medical history significant of autism/ Aspereger's Presents with mother for complaints of chest pain, fatigue, shortness of breath and diarrhea, workup significant for bilateral pleural effusion, 2-D echo significant of EF 20%.   Subjective:    Michael Ramos today Reports Some shortness of breath overnight where he had to sleep sitting up, does report some dry cough, nonproductive, no fever, no chills, no diarrhea , good appetite .   Assessment  & Plan :    Principal Problem:   Shortness of breath Active Problems:   Type 2 diabetes mellitus without complication, without long-term current use of insulin (HCC)   Autism  Acute systolic CHF - Patient presents with dyspnea, orthopnea, CT chest with evidence of bilateral pleural effusion, the echo with evidence of EF 20-25%, with diffuse hypokinesis. 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 - We'll increase his IV Lasix to 40 mg 3 times a day, as he is with positive balance, as well as start on fluid restrictions, will monitor renal function closely . - Started on Coreg by cardiology, was increase giving significant ectopy, started on low-dose lisinopril, monitor renal  function closely . - Ischemic workup as an outpatient - Cardiology assistance significantly appreciated.  Bilateral pleural effusion - She is related to CHF, discussed with radiology ,unable to drain, will monitor closely, will repeat chest x-ray in a.m. to see if it did improve with diuresis .  Diabetes mellitus - Hold metformin, continue with insulin sliding scale  Tick bite - Report tick bite 4-5 days, eon  empiric  doxycycline   Chest pain - Appears to be more related to cough, he has negative troponins 3.   Code Status : Full  Family Communication  : Discussed with  mother at bedside and sister via phone  Disposition Plan  : Home when stable  Consults  :  Cardiology  Procedures  : None  DVT Prophylaxis  :  Lovenox - SCDs   Lab Results  Component Value Date   PLT 235 08/17/2016    Antibiotics  :   Anti-infectives    Start     Dose/Rate Route Frequency Ordered Stop   08/16/16 1030  doxycycline (VIBRA-TABS) tablet 100 mg     100 mg Oral Every 12 hours 08/16/16 1016     08/15/16 1300  levofloxacin (LEVAQUIN) IVPB 500 mg     500 mg 100 mL/hr over 60 Minutes Intravenous  Once 08/15/16 1255 08/15/16 1450        Objective:   Vitals:   08/16/16 2024 08/16/16 2131 08/17/16 0500 08/17/16 0622  BP:  (!) 131/95  (!) 125/100  Pulse:  (!) 109  (!) 53  Resp:  18  18  Temp:  97.6 F (36.4 C)  97.5 F (36.4 C)  TempSrc:  Oral  Oral  SpO2: 96% 96%  97%  Weight:   127.7 kg (281 lb 7 oz)   Height:        Wt Readings from Last 3 Encounters:  08/17/16 127.7 kg (281 lb 7 oz)  03/14/16 127.9 kg (282 lb)  09/22/13 136.1 kg (300 lb)     Intake/Output Summary (Last 24 hours) at 08/17/16 1233 Last data filed at 08/17/16 1124  Gross per 24 hour  Intake              360 ml  Output             2000 ml  Net            -1640 ml     Physical Exam  Awake, alert to person, answers simple questions, sitting on chair . No JVD No use of accessory muscle, decreased air  entry at the bases, with some crackles Rate and rhythm, no rubs murmurs gallops, on telemetry sinus tachycardia with some PVCs . Abdomen soft, nontender nondistended bowel sounds present, erythema at the bite almost resolved No Cyanosis, Clubbing or edema, No new Rash or bruise      Data Review:    CBC  Recent Labs Lab 08/15/16 1115 08/16/16 0536 08/17/16 0551  WBC 11.0* 9.4 12.6*  HGB 15.6 14.4 15.3  HCT 46.8 44.5 46.4  PLT 220 196 235  MCV 88.3 88.6 88.0  MCH 29.4 28.7 29.0  MCHC 33.3 32.4 33.0  RDW 13.6 13.9 13.9    Chemistries   Recent Labs Lab 08/15/16 1115 08/15/16 1744 08/16/16 0536 08/17/16 0551  NA 139  --  138 138  K 4.5  --  4.1 3.9  CL 103  --  105 103  CO2 27  --  26 27  GLUCOSE 143*  --  140* 151*  BUN 11  --  13 15  CREATININE 1.27*  --  1.40* 1.39*  CALCIUM 9.0  --  8.4* 8.9  MG  --   --   --  1.9  AST  --  30  --  32  ALT  --  51  --  46  ALKPHOS  --  32*  --  28*  BILITOT  --  0.8  --  1.3*   ------------------------------------------------------------------------------------------------------------------ No results for input(s): CHOL, HDL, LDLCALC, TRIG, CHOLHDL, LDLDIRECT in the last 72 hours.  No results found for: HGBA1C ------------------------------------------------------------------------------------------------------------------  Recent Labs  08/16/16 0536  TSH 1.671   ------------------------------------------------------------------------------------------------------------------ No results for input(s): VITAMINB12, FOLATE, FERRITIN, TIBC, IRON, RETICCTPCT in the last 72 hours.  Coagulation profile No results for input(s): INR, PROTIME in the last 168 hours.  No results for input(s): DDIMER in the last 72 hours.  Cardiac Enzymes  Recent Labs Lab 08/15/16 1738 08/15/16 2320 08/16/16 0536  TROPONINI <0.03 <0.03 <0.03    ------------------------------------------------------------------------------------------------------------------    Component Value Date/Time   BNP 204.0 (H) 08/16/2016 0536    Inpatient Medications  Scheduled Meds: . carvedilol  6.25 mg Oral BID WC  . doxycycline  100 mg Oral Q12H  . enoxaparin (LOVENOX) injection  60 mg Subcutaneous Q24H  . furosemide  40 mg Intravenous TID  . insulin aspart  0-9 Units Subcutaneous TID WC  . lisinopril  2.5 mg Oral Daily   Continuous Infusions: PRN Meds:.acetaminophen **OR** acetaminophen, gi cocktail, guaiFENesin-dextromethorphan, levalbuterol  Micro Results Recent Results (from the past 240 hour(s))  Blood culture (routine x 2)     Status: None (Preliminary result)   Collection Time: 08/15/16  1:22 PM  Result Value Ref Range Status   Specimen Description BLOOD RIGHT ARM  Final   Special Requests   Final    BOTTLES DRAWN AEROBIC AND ANAEROBIC Blood Culture adequate volume   Culture NO GROWTH 2 DAYS  Final   Report Status PENDING  Incomplete  Blood culture (routine x 2)     Status: None (Preliminary result)   Collection Time: 08/15/16  1:29 PM  Result Value Ref Range Status   Specimen Description BLOOD RIGHT HAND  Final   Special Requests   Final    BOTTLES DRAWN AEROBIC ONLY Blood Culture results may not be optimal due to an inadequate volume of blood received in culture bottles   Culture NO GROWTH 2 DAYS  Final   Report Status PENDING  Incomplete    Radiology Reports Dg Chest 2 View  Result Date: 08/15/2016 CLINICAL DATA:  Chest pain EXAM: CHEST  2 VIEW COMPARISON:  None. FINDINGS: Small right pleural effusion with mild right lower lobe airspace disease. Left lung is clear. Negative for heart failure or edema. Heart size upper normal. No mass lesion identified. IMPRESSION: Mild right lower lobe airspace disease and small right effusion. Differential diagnosis includes infection and pulmonary embolism. Neoplasm also in the  differential. Electronically Signed   By: Franchot Gallo M.D.   On: 08/15/2016 11:47   Ct Chest W Contrast  Result Date: 08/15/2016 CLINICAL DATA:  Cough since last night. EXAM: CT CHEST WITH CONTRAST TECHNIQUE: Multidetector CT imaging of the chest was performed during intravenous contrast administration. CONTRAST:  53mL ISOVUE-300 IOPAMIDOL (ISOVUE-300) INJECTION 61% COMPARISON:  None. FINDINGS: Cardiovascular: No significant vascular findings. Normal heart size. No pericardial effusion. Normal caliber thoracic aorta. No thoracic aortic dissection. Mediastinum/Nodes: No enlarged mediastinal, hilar, or axillary lymph nodes. Thyroid gland, trachea, and esophagus demonstrate no significant findings. Lungs/Pleura: Moderate right and small left pleural effusion. Bibasilar dependent airspace disease likely reflecting atelectasis versus less likely pneumonia. No pneumothorax. Mild bilateral interstitial thickening. Upper Abdomen: No acute upper abdominal abnormality. Musculoskeletal: No acute osseous abnormality. No lytic or sclerotic osseous lesion. IMPRESSION: 1. Moderate right and small left pleural effusion. Bibasilar dependent airspace disease likely reflecting atelectasis versus less likely pneumonia. Bilateral mild interstitial  thickening suggesting an element of mild pulmonary edema. Electronically Signed   By: Kathreen Devoid   On: 08/15/2016 13:32   [place] Chest  Result Date: 08/16/2016 CLINICAL DATA:  Pleural effusions EXAM: CHEST ULTRASOUND COMPARISON:  CT chest 08/15/2016 FINDINGS: Small BILATERAL pleural effusions. Small sizes of these collections make these unlikely to expect significant symptomatic improvement following thoracentesis. Thoracentesis not performed at this time. IMPRESSION: Small BILATERAL pleural effusions. Thoracentesis not performed at this time. Electronically Signed   By: Lavonia Dana M.D.   On: 08/16/2016 13:27     Lessie Manigo M.D on 08/17/2016 at 12:33 PM  Between 7am to 7pm  - Pager - [phone]  After 7pm go to [email or web address] - password [facility]  [facility] -  Office  [phone]

## 2016-08-18 ENCOUNTER — Inpatient Hospital Stay (HOSPITAL_COMMUNITY): Payer: Medicare Other

## 2016-08-18 DIAGNOSIS — I5021 Acute systolic (congestive) heart failure: Secondary | ICD-10-CM

## 2016-08-18 LAB — RMSF, IGG, IFA: RMSF, IGG, IFA: 1:128 {titer} — ABNORMAL HIGH

## 2016-08-18 LAB — CBC
HCT: 45.4 % (ref 39.0–52.0)
Hemoglobin: 14.8 g/dL (ref 13.0–17.0)
MCH: 28.9 pg (ref 26.0–34.0)
MCHC: 32.6 g/dL (ref 30.0–36.0)
MCV: 88.7 fL (ref 78.0–100.0)
PLATELETS: 201 10*3/uL (ref 150–400)
RBC: 5.12 MIL/uL (ref 4.22–5.81)
RDW: 13.8 % (ref 11.5–15.5)
WBC: 10.4 10*3/uL (ref 4.0–10.5)

## 2016-08-18 LAB — BASIC METABOLIC PANEL
Anion gap: 8 (ref 5–15)
BUN: 17 mg/dL (ref 6–20)
CO2: 30 mmol/L (ref 22–32)
CREATININE: 1.55 mg/dL — AB (ref 0.61–1.24)
Calcium: 8.5 mg/dL — ABNORMAL LOW (ref 8.9–10.3)
Chloride: 100 mmol/L — ABNORMAL LOW (ref 101–111)
GFR calc Af Amer: >60 mL/min (ref >60)
GFR, EST NON AFRICAN AMERICAN: 53 mL/min — AB (ref >60)
GLUCOSE: 126 mg/dL — AB (ref 65–99)
POTASSIUM: 3.6 mmol/L (ref 3.5–5.1)
SODIUM: 138 mmol/L (ref 135–145)

## 2016-08-18 LAB — ADENOVIRUS ANTIBODIES: Adenovirus Antibody: NEGATIVE

## 2016-08-18 LAB — COXSACKIE A VIRUS ANTIBODIES
COXSACKIE A16 IGM: NEGATIVE {titer}
COXSACKIE A9 IGM: NEGATIVE {titer}
Coxsackie A16 IgG: 1:800 {titer} — ABNORMAL HIGH
Coxsackie A24 IgG: 1:1600 {titer} — ABNORMAL HIGH
Coxsackie A24 IgM: NEGATIVE titer
Coxsackie A7 IgG: 1:400 {titer} — ABNORMAL HIGH
Coxsackie A7 IgM: NEGATIVE titer
Coxsackie A9 IgG: 1:400 {titer} — ABNORMAL HIGH

## 2016-08-18 LAB — GLUCOSE, CAPILLARY
GLUCOSE-CAPILLARY: 120 mg/dL — AB (ref 65–99)
Glucose-Capillary: 149 mg/dL — ABNORMAL HIGH (ref 65–99)
Glucose-Capillary: 137 mg/dL — ABNORMAL HIGH (ref 65–99)
Glucose-Capillary: 152 mg/dL — ABNORMAL HIGH (ref 65–99)

## 2016-08-18 LAB — PARVOVIRUS B19 ANTIBODY, IGG AND IGM
PAROVIRUS B19 IGG ABS: 5.4 {index} — AB (ref 0.0–0.8)
Parovirus B19 IgM Abs: 0.5 index (ref 0.0–0.8)

## 2016-08-18 LAB — COXSACKIE B VIRUS ANTIBODIES
COXSACKIE B1 AB: NEGATIVE
COXSACKIE B2 AB: NEGATIVE
Coxsackie B3 Ab: NEGATIVE
Coxsackie B4 Ab: NEGATIVE
Coxsackie B5 Ab: 1:16 {titer} — ABNORMAL HIGH
Coxsackie B6 Ab: 1:8 {titer} — ABNORMAL HIGH

## 2016-08-18 LAB — ROCKY MTN SPOTTED FVR ABS PNL(IGG+IGM)
RMSF IGG: POSITIVE — AB
RMSF IgM: 0.73 index (ref 0.00–0.89)

## 2016-08-18 MED ORDER — FUROSEMIDE 40 MG PO TABS
40.0000 mg | ORAL_TABLET | Freq: Two times a day (BID) | ORAL | Status: DC
  Administered 2016-08-19: 40 mg via ORAL
  Filled 2016-08-18: qty 1

## 2016-08-18 MED ORDER — SODIUM CHLORIDE 0.9% FLUSH
3.0000 mL | INTRAVENOUS | Status: DC | PRN
  Administered 2016-08-18: 3 mL via INTRAVENOUS
  Filled 2016-08-18: qty 3

## 2016-08-18 MED ORDER — SODIUM CHLORIDE 0.9 % IV SOLN: 250.0000 mL | INTRAVENOUS | Status: DC | PRN

## 2016-08-18 MED ORDER — SODIUM CHLORIDE 0.9% FLUSH
3.0000 mL | Freq: Two times a day (BID) | INTRAVENOUS | Status: DC
  Administered 2016-08-18: 3 mL via INTRAVENOUS

## 2016-08-18 NOTE — Progress Notes (Signed)
Progress Note  Patient Name: Michael Ramos Date of Encounter: 08/18/2016  Primary Cardiologist:  Subjective   SOB is improving.   Inpatient Medications    Scheduled Meds: . carvedilol  6.25 mg Oral BID WC  . doxycycline  100 mg Oral Q12H  . enoxaparin (LOVENOX) injection  60 mg Subcutaneous Q24H  . furosemide  40 mg Intravenous TID  . insulin aspart  0-9 Units Subcutaneous TID WC  . lisinopril  2.5 mg Oral Daily   Continuous Infusions:  PRN Meds: acetaminophen **OR** acetaminophen, gi cocktail, guaiFENesin-dextromethorphan, levalbuterol   Vital Signs    Vitals:   08/17/16 1512 08/17/16 1607 08/17/16 2058 08/18/16 0631  BP:  140/87 119/81 (!) 132/91  Pulse:  87 99 98  Resp:  16 18 18   Temp:  97.8 F (36.6 C) 97.7 F (36.5 C) 97.7 F (36.5 C)  TempSrc:  Oral Oral Oral  SpO2: 93% 95% 96% 95%  Weight:    275 lb 6.4 oz (124.9 kg)  Height:    6\' 3"  (1.905 m)    Intake/Output Summary (Last 24 hours) at 08/18/16 0843 Last data filed at 08/18/16 0039  Gross per 24 hour  Intake             1080 ml  Output             2550 ml  Net            -1470 ml   Filed Weights   08/15/16 1751 08/17/16 0500 08/18/16 0631  Weight: 284 lb 11.2 oz (129.1 kg) 281 lb 7 oz (127.7 kg) 275 lb 6.4 oz (124.9 kg)    Telemetry    SR and sinus tach - Personally Reviewed  ECG     - Personally Reviewed  Physical Exam   GEN: No acute distress.   Neck: No JVD Cardiac: RRR, no murmurs, rubs, or gallops.  Respiratory: Clear to auscultation bilaterally. GI: Soft, nontender, non-distended  MS: No edema; No deformity. Neuro:  Nonfocal  Psych: Normal affect   Labs    Chemistry Recent Labs Lab 08/15/16 1744 08/16/16 0536 08/17/16 0551 08/18/16 0422  NA  --  138 138 138  K  --  4.1 3.9 3.6  CL  --  105 103 100*  CO2  --  26 27 30   GLUCOSE  --  140* 151* 126*  BUN  --  13 15 17   CREATININE  --  1.40* 1.39* 1.55*  CALCIUM  --  8.4* 8.9 8.5*  PROT 7.3  --  7.0  --     ALBUMIN 3.9  --  3.7  --   AST 30  --  32  --   ALT 51  --  46  --   ALKPHOS 32*  --  28*  --   BILITOT 0.8  --  1.3*  --   GFRNONAA  --  60* >60 53*  GFRAA  --  >60 >60 >60  ANIONGAP  --  7 8 8      Hematology Recent Labs Lab 08/16/16 0536 08/17/16 0551 08/18/16 0422  WBC 9.4 12.6* 10.4  RBC 5.02 5.27 5.12  HGB 14.4 15.3 14.8  HCT 44.5 46.4 45.4  MCV 88.6 88.0 88.7  MCH 28.7 29.0 28.9  MCHC 32.4 33.0 32.6  RDW 13.9 13.9 13.8  PLT 196 235 201    Cardiac Enzymes Recent Labs Lab 08/15/16 1115 08/15/16 1738 08/15/16 2320 08/16/16 0536  TROPONINI <0.03 <0.03 <0.03 <0.03  No results for input(s): TROPIPOC in the last 168 hours.   BNP Recent Labs Lab 08/15/16 1115 08/16/16 0536  BNP 194.0* 204.0*     DDimer No results for input(s): DDIMER in the last 168 hours.   Radiology    [place] Chest  Result Date: 08/16/2016 CLINICAL DATA:  Pleural effusions EXAM: CHEST ULTRASOUND COMPARISON:  CT chest 08/15/2016 FINDINGS: Small BILATERAL pleural effusions. Small sizes of these collections make these unlikely to expect significant symptomatic improvement following thoracentesis. Thoracentesis not performed at this time. IMPRESSION: Small BILATERAL pleural effusions. Thoracentesis not performed at this time. Electronically Signed   By: Lavonia Dana M.D.   On: 08/16/2016 13:27    Cardiac Studies    Patient Profile     46 yo male with history of autism presents with chest pain/abdominal pain, cough and SOB diagnosed with new onset acute systolic HF.    Assessment & Plan    1. Acute systolic HF - echo LVEF 88-41%, global hypokinesis, abnormal diastolic function indeterminant grade - I/Os are incomplete this admit. Negative 1.4 liters yesterday. He is on lasix IV 40mg  tid. Renal function fairly stable. Uptrend in Cr, we will d/c IV lasix today (he received a 6AM dose). Start oral lasix 40mg  bid tomorrow.  - started on coreag 3.125mg  bid, lisionpril 2.5mg  I think his Cr bump  is due to lasix and not ACE-I, would continue at this time, likely will trend up further tomorrow as he did get an AM dose today of IV lasix.. If persistent elevation may need to consider stopping ACE-I over time.  - normal TSH, HIV neg. Recent tick bite, serologies pending though I think less likely etiology. 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  - likely plan for outpatient cath, in absence of evidence of acute ischemia.  Would consider likely discharge tomorrow. If Cr above 1.65 tomorrow would stop ACE-I, would reintroduce as outpatient as his AKI is more likely diuretic induced. Would have patient begin ambulating with nursing staff today, I have placed order. Will need outpatient f/u in 1 week in cardiology clinic with BMET/Mg, I have contacted our office to arrange     Signed, Carlyle Dolly, MD  08/18/2016, 8:43 AM

## 2016-08-18 NOTE — Progress Notes (Signed)
Ambulated in the hall, no complaints of SOB.

## 2016-08-18 NOTE — Progress Notes (Signed)
PROGRESS NOTE                                                                                                                                                                                                             Patient Demographics:    Michael Ramos, is a 46 y.o. male, DOB - Apr 02, 1971, UEA:[id number]  Admit date - 08/15/2016   Admitting Physician Eber Jones, MD  Outpatient Primary MD for the patient is Theodoro Clock  LOS - 2  Chief Complaint  Patient presents with  . Chest Pain  . Diarrhea  . Fatigue       Brief Narrative   46 y.o. male with medical history significant of autism/ Aspereger's Presents with mother for complaints of chest pain, fatigue, shortness of breath and diarrhea, workup significant for bilateral pleural effusion, 2-D echo significant of EF 20%.   Subjective:    Michael Ramos today Reports Shortness of breath improving, had better night sleep, ambulating in the hallway with no dyspnea, still with some dry cough, nonproductive, no fever .   Assessment  & Plan :    Principal Problem:   Shortness of breath Active Problems:   Type 2 diabetes mellitus without complication, without long-term current use of insulin (HCC)   Autism  Acute systolic CHF - Patient presents with dyspnea, orthopnea, CT chest with evidence of bilateral pleural effusion, the echo with evidence of EF 20-25%, with diffuse hypokinesis. 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 coxsackie A IgM negative, but the IgG positive which indicates old infection.ovirus, CMV negative, HIV nonreactive. -  patient was on IV diuresis, so far -1.4 L during hospital stay, transitioned to by mouth Lasix and giving increase of creatinine to 1.55 today , emphasized importance of fluid restriction and salt restriction diet, will monitor renal  function closely . -  continue with Coreg and lisinopril, creatinine bump most likely related to Lasix, continue to monitor closely on lisinopril, DC lisinopril of creatinine above 1.65 tomorrow. - Ischemic workup as an outpatient - Cardiology assistance significantly appreciated.  Bilateral pleural effusion - She is related to CHF, discussed with radiology ,unable to drain, will monitor closely, 2 view chest x-ray today showing stable pleural effusion .  Diabetes mellitus - Hold metformin, continue  with insulin sliding scale  Tick bite - Report tick bite 4-5 days, on empiric doxycycline   Chest pain - Appears to be more related to cough, he has negative troponins 3.   Code Status : Full  Family Communication  : Discussed with  mother at bedside and sister via phone  Disposition Plan  : Home when stable  Consults  :  Cardiology  Procedures  : None  DVT Prophylaxis  :  Lovenox - SCDs   Lab Results  Component Value Date   PLT 201 08/18/2016    Antibiotics  :   Anti-infectives    Start     Dose/Rate Route Frequency Ordered Stop   08/16/16 1030  doxycycline (VIBRA-TABS) tablet 100 mg     100 mg Oral Every 12 hours 08/16/16 1016     08/15/16 1300  levofloxacin (LEVAQUIN) IVPB 500 mg     500 mg 100 mL/hr over 60 Minutes Intravenous  Once 08/15/16 1255 08/15/16 1450        Objective:   Vitals:   08/17/16 1512 08/17/16 1607 08/17/16 2058 08/18/16 0631  BP:  140/87 119/81 (!) 132/91  Pulse:  87 99 98  Resp:  16 18 18   Temp:  97.8 F (36.6 C) 97.7 F (36.5 C) 97.7 F (36.5 C)  TempSrc:  Oral Oral Oral  SpO2: 93% 95% 96% 95%  Weight:    124.9 kg (275 lb 6.4 oz)  Height:    6\' 3"  (1.905 m)    Wt Readings from Last 3 Encounters:  08/18/16 124.9 kg (275 lb 6.4 oz)  03/14/16 127.9 kg (282 lb)  09/22/13 136.1 kg (300 lb)     Intake/Output Summary (Last 24 hours) at 08/18/16 1309 Last data filed at 08/18/16 0931  Gross per 24 hour  Intake              963 ml    Output             2950 ml  Net            -1987 ml     Physical Exam  Awake, alert appears to person, denies any complaints  Supple neck, no JVD Diminished air entry at the bases, otherwise no wheezing, is accessory muscle Regular rate and rhythms, no rubs murmurs gallops . Abdomen soft, nontender nondistended bowel sounds present No Cyanosis, Clubbing or edema, no rash    Data Review:    CBC  Recent Labs Lab 08/15/16 1115 08/16/16 0536 08/17/16 0551 08/18/16 0422  WBC 11.0* 9.4 12.6* 10.4  HGB 15.6 14.4 15.3 14.8  HCT 46.8 44.5 46.4 45.4  PLT 220 196 235 201  MCV 88.3 88.6 88.0 88.7  MCH 29.4 28.7 29.0 28.9  MCHC 33.3 32.4 33.0 32.6  RDW 13.6 13.9 13.9 13.8    Chemistries   Recent Labs Lab 08/15/16 1115 08/15/16 1744 08/16/16 0536 08/17/16 0551 08/18/16 0422  NA 139  --  138 138 138  K 4.5  --  4.1 3.9 3.6  CL 103  --  105 103 100*  CO2 27  --  26 27 30   GLUCOSE 143*  --  140* 151* 126*  BUN 11  --  13 15 17   CREATININE 1.27*  --  1.40* 1.39* 1.55*  CALCIUM 9.0  --  8.4* 8.9 8.5*  MG  --   --   --  1.9  --   AST  --  30  --  32  --  ALT  --  51  --  46  --   ALKPHOS  --  32*  --  28*  --   BILITOT  --  0.8  --  1.3*  --    ------------------------------------------------------------------------------------------------------------------ No results for input(s): CHOL, HDL, LDLCALC, TRIG, CHOLHDL, LDLDIRECT in the last 72 hours.  No results found for: HGBA1C ------------------------------------------------------------------------------------------------------------------  Recent Labs  08/16/16 0536  TSH 1.671   ------------------------------------------------------------------------------------------------------------------ No results for input(s): VITAMINB12, FOLATE, FERRITIN, TIBC, IRON, RETICCTPCT in the last 72 hours.  Coagulation profile No results for input(s): INR, PROTIME in the last 168 hours.  No results for input(s): DDIMER in  the last 72 hours.  Cardiac Enzymes  Recent Labs Lab 08/15/16 1738 08/15/16 2320 08/16/16 0536  TROPONINI <0.03 <0.03 <0.03   ------------------------------------------------------------------------------------------------------------------    Component Value Date/Time   BNP 204.0 (H) 08/16/2016 0536    Inpatient Medications  Scheduled Meds: . carvedilol  6.25 mg Oral BID WC  . doxycycline  100 mg Oral Q12H  . enoxaparin (LOVENOX) injection  60 mg Subcutaneous Q24H  . [START ON 08/19/2016] furosemide  40 mg Oral BID  . insulin aspart  0-9 Units Subcutaneous TID WC  . lisinopril  2.5 mg Oral Daily  . sodium chloride flush  3 mL Intravenous Q12H   Continuous Infusions: . sodium chloride     PRN Meds:.sodium chloride, acetaminophen **OR** acetaminophen, gi cocktail, guaiFENesin-dextromethorphan, levalbuterol, sodium chloride flush  Micro Results Recent Results (from the past 240 hour(s))  Blood culture (routine x 2)     Status: None (Preliminary result)   Collection Time: 08/15/16  1:22 PM  Result Value Ref Range Status   Specimen Description BLOOD RIGHT ARM  Final   Special Requests   Final    BOTTLES DRAWN AEROBIC AND ANAEROBIC Blood Culture adequate volume   Culture NO GROWTH 3 DAYS  Final   Report Status PENDING  Incomplete  Blood culture (routine x 2)     Status: None (Preliminary result)   Collection Time: 08/15/16  1:29 PM  Result Value Ref Range Status   Specimen Description BLOOD RIGHT HAND  Final   Special Requests   Final    BOTTLES DRAWN AEROBIC ONLY Blood Culture results may not be optimal due to an inadequate volume of blood received in culture bottles   Culture NO GROWTH 3 DAYS  Final   Report Status PENDING  Incomplete    Radiology Reports Dg Chest 2 View  Result Date: 08/18/2016 CLINICAL DATA:  Pleural effusion EXAM: CHEST  2 VIEW COMPARISON:  Chest radiograph and chest CT Aug 15, 2016 FINDINGS: There are small pleural effusions bilaterally with  slight bibasilar atelectasis. Lungs elsewhere clear. Heart is upper normal in size with pulmonary vascular within normal limits. No adenopathy. No bone lesions peer. IMPRESSION: Small pleural effusions with mild bibasilar atelectatic change, essentially stable. Lungs elsewhere clear. Stable cardiac silhouette. Electronically Signed   By: Lowella Grip III M.D.   On: 08/18/2016 09:00   Dg Chest 2 View  Result Date: 08/15/2016 CLINICAL DATA:  Chest pain EXAM: CHEST  2 VIEW COMPARISON:  None. FINDINGS: Small right pleural effusion with mild right lower lobe airspace disease. Left lung is clear. Negative for heart failure or edema. Heart size upper normal. No mass lesion identified. IMPRESSION: Mild right lower lobe airspace disease and small right effusion. Differential diagnosis includes infection and pulmonary embolism. Neoplasm also in the differential. Electronically Signed   By: Franchot Gallo M.D.   On:  08/15/2016 11:47   Ct Chest W Contrast  Result Date: 08/15/2016 CLINICAL DATA:  Cough since last night. EXAM: CT CHEST WITH CONTRAST TECHNIQUE: Multidetector CT imaging of the chest was performed during intravenous contrast administration. CONTRAST:  57mL ISOVUE-300 IOPAMIDOL (ISOVUE-300) INJECTION 61% COMPARISON:  None. FINDINGS: Cardiovascular: No significant vascular findings. Normal heart size. No pericardial effusion. Normal caliber thoracic aorta. No thoracic aortic dissection. Mediastinum/Nodes: No enlarged mediastinal, hilar, or axillary lymph nodes. Thyroid gland, trachea, and esophagus demonstrate no significant findings. Lungs/Pleura: Moderate right and small left pleural effusion. Bibasilar dependent airspace disease likely reflecting atelectasis versus less likely pneumonia. No pneumothorax. Mild bilateral interstitial thickening. Upper Abdomen: No acute upper abdominal abnormality. Musculoskeletal: No acute osseous abnormality. No lytic or sclerotic osseous lesion. IMPRESSION: 1. Moderate  right and small left pleural effusion. Bibasilar dependent airspace disease likely reflecting atelectasis versus less likely pneumonia. Bilateral mild interstitial thickening suggesting an element of mild pulmonary edema. Electronically Signed   By: Kathreen Devoid   On: 08/15/2016 13:32   [place] Chest  Result Date: 08/16/2016 CLINICAL DATA:  Pleural effusions EXAM: CHEST ULTRASOUND COMPARISON:  CT chest 08/15/2016 FINDINGS: Small BILATERAL pleural effusions. Small sizes of these collections make these unlikely to expect significant symptomatic improvement following thoracentesis. Thoracentesis not performed at this time. IMPRESSION: Small BILATERAL pleural effusions. Thoracentesis not performed at this time. Electronically Signed   By: Lavonia Dana M.D.   On: 08/16/2016 13:27     ELGERGAWY, DAWOOD M.D on 08/18/2016 at 1:09 PM  Between 7am to 7pm - Pager - [phone]  After 7pm go to [email or web address] - password [facility]  [facility] -  Office  [phone]

## 2016-08-19 LAB — BASIC METABOLIC PANEL
ANION GAP: 8 (ref 5–15)
BUN: 21 mg/dL — ABNORMAL HIGH (ref 6–20)
CALCIUM: 8.8 mg/dL — AB (ref 8.9–10.3)
CO2: 30 mmol/L (ref 22–32)
Chloride: 101 mmol/L (ref 101–111)
Creatinine, Ser: 1.49 mg/dL — ABNORMAL HIGH (ref 0.61–1.24)
GFR, EST NON AFRICAN AMERICAN: 55 mL/min — AB (ref >60)
GLUCOSE: 129 mg/dL — AB (ref 65–99)
POTASSIUM: 3.9 mmol/L (ref 3.5–5.1)
Sodium: 139 mmol/L (ref 135–145)

## 2016-08-19 LAB — GLUCOSE, CAPILLARY: GLUCOSE-CAPILLARY: 136 mg/dL — AB (ref 65–99)

## 2016-08-19 MED ORDER — GUAIFENESIN-DM 100-10 MG/5ML PO SYRP
5.0000 mL | ORAL_SOLUTION | Freq: Four times a day (QID) | ORAL | 0 refills | Status: DC | PRN
Start: 2016-08-19 — End: 2016-08-28

## 2016-08-19 MED ORDER — FUROSEMIDE 40 MG PO TABS: 40.0000 mg | ORAL_TABLET | Freq: Two times a day (BID) | ORAL | 0 refills | Status: DC

## 2016-08-19 MED ORDER — LISINOPRIL 2.5 MG PO TABS: 2.5000 mg | ORAL_TABLET | Freq: Every day | ORAL | 0 refills | Status: DC

## 2016-08-19 MED ORDER — CARVEDILOL 6.25 MG PO TABS: 6.2500 mg | ORAL_TABLET | Freq: Two times a day (BID) | ORAL | 0 refills | Status: DC

## 2016-08-19 MED ORDER — GUAIFENESIN-DM 100-10 MG/5ML PO SYRP
5.0000 mL | ORAL_SOLUTION | Freq: Four times a day (QID) | ORAL | 0 refills | Status: DC | PRN
Start: 2016-08-19 — End: 2016-08-19

## 2016-08-19 MED ORDER — DOXYCYCLINE HYCLATE 100 MG PO TABS: 100.0000 mg | ORAL_TABLET | Freq: Two times a day (BID) | ORAL | 0 refills | Status: DC

## 2016-08-19 NOTE — Progress Notes (Signed)
Discharge instructions and prescriptions given to patient's mother who verbalized understanding, out in stable condition ambulatory with staff.

## 2016-08-19 NOTE — Discharge Instructions (Signed)
Follow with Primary MD Terald Sleeper, PA-C in 7 days   Get CBC, CMP, 2 view Chest X ray checked  by Primary MD next visit.    Activity: As tolerated with Full fall precautions use walker/cane & assistance as needed   Disposition Home    Diet: Heart Healthy , low-salt, carb modified with fluid restriction 1500 ml/day , with feeding assistance and aspiration precautions.  For Heart failure patients - Check your Weight same time everyday, if you gain over 2 pounds, or you develop in leg swelling, experience more shortness of breath or chest pain, call your Primary MD immediately. Follow Cardiac Low Salt Diet and 1.5 lit/day fluid restriction.   On your next visit with your primary care physician please Get Medicines reviewed and adjusted.   Please request your Prim.MD to go over all Hospital Tests and Procedure/Radiological results at the follow up, please get all Hospital records sent to your Prim MD by signing hospital release before you go home.   If you experience worsening of your admission symptoms, develop shortness of breath, life threatening emergency, suicidal or homicidal thoughts you must seek medical attention immediately by calling 911 or calling your MD immediately  if symptoms less severe.  You Must read complete instructions/literature along with all the possible adverse reactions/side effects for all the Medicines you take and that have been prescribed to you. Take any new Medicines after you have completely understood and accpet all the possible adverse reactions/side effects.   Do not drive, operating heavy machinery, perform activities at heights, swimming or participation in water activities or provide baby sitting services if your were admitted for syncope or siezures until you have seen by Primary MD or a Neurologist and advised to do so again.  Do not drive when taking Pain medications.    Do not take more than prescribed Pain, Sleep and Anxiety  Medications  Special Instructions: If you have smoked or chewed Tobacco  in the last 2 yrs please stop smoking, stop any regular Alcohol  and or any Recreational drug use.  Wear Seat belts while driving.   Please note  You were cared for by a hospitalist during your hospital stay. If you have any questions about your discharge medications or the care you received while you were in the hospital after you are discharged, you can call the unit and asked to speak with the hospitalist on call if the hospitalist that took care of you is not available. Once you are discharged, your primary care physician will handle any further medical issues. Please note that NO REFILLS for any discharge medications will be authorized once you are discharged, as it is imperative that you return to your primary care physician (or establish a relationship with a primary care physician if you do not have one) for your aftercare needs so that they can reassess your need for medications and monitor your lab values.

## 2016-08-19 NOTE — Discharge Summary (Signed)
Michael Ramos, is a 46 y.o. male  DOB 01-29-1971  MRN [id number].  Admission date:  08/15/2016  Admitting Physician  Eber Jones, MD  Discharge Date:  08/19/2016   Primary MD  Terald Sleeper, PA-C  Recommendations for primary care physician for things to follow:  - please check CBC, BMP during next visit to ensure stable renal function as patient started on lisinopril and Lasix. - Patient to keep cardiology follow-up on 5/21. - Patient will need sleep study as an outpatient when he recovers from current CHF episode.  Admission Diagnosis  SOB (shortness of breath) [R06.02]   Discharge Diagnosis  SOB (shortness of breath) [R06.02]   Principal Problem:   Shortness of breath Active Problems:   Type 2 diabetes mellitus without complication, without long-term current use of insulin (HCC)   Autism   Acute systolic CHF (congestive heart failure) (HCC)      Past Medical History:  Diagnosis Date  . Allergy   . Autism   . Diabetes mellitus without complication ([place])     History reviewed. No pertinent surgical history.     History of present illness and  Hospital Course:     Kindly see H&P for history of present illness and admission details, please review complete Labs, Consult reports and Test reports for all details in brief  HPI  from the history and physical done on the day of admission 08/15/2016  HPI: Michael Ramos is a 46 y.o. male with medical history significant of autism/ Aspereger's presents with his mother for concern of chest pain and stomach pain.  Per mother patient was essentially normal/ at baseline yesterday but last night shortly after midnight patient woke his mother up with concerns of abdominal pain and chest pain.  Mother voices that patient did not sleep much last night due to going to bathroom repeatedly.  Mother reports that patient was unable to lay flat last  night secondary to being short of breath.  Mother brought patient to urgent care this morning and one of the nurses told patient's mother that he was worried about the patient.  Mother says that patient seemed to be in a fog this morning, needing questions repeated numerous times before being able to answer.  Patient mother also says that patient removed a tick from his left side a few days ago.  Denies any fever, chills, denies nausea, vomiting, constipation.  8 stools last night but mother is unsure if they were loose or if they were bloody.  ED Course: Blood work showing creatinine of 1.29, BNP of 194.0, WBC of 11.  Blood cultures were collected.  Chest xray performed showing Mild right lower lobe airspace disease and small right effusion. Differential diagnosis includes infection and pulmonary embolism, Neoplasm also in the differential.  CT scan w/ contrast ordered and showed Moderate right and small left pleural effusion. Bibasilar dependent airspace disease likely reflecting atelectasis versus less likely pneumonia. Bilateral mild interstitial thickening suggesting an element of mild pulmonary edema. TR[facility] was asked  to admit for evaluation of cough, chest pain and stomach pain.   Hospital Course   46 y.o.malewith medical history significant of autism/ Aspereger's Presents with mother for complaints of chest pain, fatigue, shortness of breath and diarrhea, workup significant for bilateral pleural effusion, 2-D echo significant of EF 20%.  Acute systolic CHF - Patient presents with dyspnea, orthopnea, CT chest with evidence of bilateral pleural effusion, the echo with evidence of EF 20-25%, with diffuse hypokinesis. 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 -  patient was on IV diuresis during hospital stay, which has changed to by mouth Lasix yesterday given slight bump in creatinine to 1.55, patient creatinine is 1.49 today, he will be discharged on Lasix 40 mg oral twice a day per cardiology recommendation, instructed family to weight patient daily, comply with fluid restrictions and salt restriction, and to give extra dose of Lasix if patient again more than 1 pound  in 24 hours. - Ischemic workup as an outpatient - Cardiology assistance significantly appreciated.  Bilateral pleural effusion -  is related to CHF, discussed with radiology ,unable to drain,  Diabetes mellitus - Resume metformin on discharge, was kept on insulin sliding scale during hospital stay  Tick bite - Report tick bite 4-5 days prior to admission, on empiric doxycycline during hospital stay, lyme panel is negative, RMSF IgG is positive, but patient with known elevated LFTs, afebrile, no rash, no clinical evidence of infection , so there is no indication to treat with doxycycline as discussed with ID Dr. Johnnye Sima .  Chest pain - Appears to be more related to cough, he has negative troponins 3.     Discharge Condition:  Stable Discussed with mother and sister at bedside  Follow UP  Follow-up Information    Terald Sleeper, PA-C Follow up.   Specialties:  Physician Assistant, Family Medicine Contact information: [place] [address] [place] Follow up on 08/28/2016.   Why:  at 3:30 pm Contact information: [address] [place] [address] [phone]            Discharge Instructions  and  Discharge Medications     Discharge Instructions    Discharge instructions    Complete by:  As directed    Follow with Primary MD Terald Sleeper, PA-C in 7  days   Get CBC, CMP, 2 view Chest X ray checked  by Primary MD next visit.    Activity: As tolerated with Full fall precautions use walker/cane & assistance as needed   Disposition Home    Diet: Heart Healthy , low-salt, carb modified with fluid restriction 1500 ml/day , with feeding assistance and aspiration precautions.  For Heart failure patients - Check your Weight same time everyday, if you gain over 2 pounds, or you develop in leg swelling, experience more shortness of breath or chest pain, call your Primary MD immediately. Follow Cardiac Low Salt Diet and 1.5 lit/day fluid restriction.   On your next visit with your primary care physician please Get Medicines reviewed and adjusted.  Please request your Prim.MD to go over all Hospital Tests and Procedure/Radiological results at the follow up, please get all Hospital records sent to your Prim MD by signing hospital release before you go home.   If you experience worsening of your admission symptoms, develop shortness of breath, life threatening emergency, suicidal or homicidal thoughts you must seek medical attention immediately by calling 911 or calling your MD immediately  if symptoms less severe.  You Must read complete instructions/literature along with all the possible adverse reactions/side effects for all the Medicines you take and that have been prescribed to you. Take any new Medicines after you have completely understood and accpet all the possible adverse reactions/side effects.   Do not drive, operating heavy machinery, perform activities at heights, swimming or participation in water activities or provide baby sitting services if your were admitted for syncope or siezures until you have seen by Primary MD or a Neurologist and advised to do so again.  Do not drive when taking Pain medications.    Do not take more than prescribed Pain, Sleep and Anxiety Medications  Special Instructions: If you have smoked or chewed  Tobacco  in the last 2 yrs please stop smoking, stop any regular Alcohol  and or any Recreational drug use.  Wear Seat belts while driving.   Please note  You were cared for by a hospitalist during your hospital stay. If you have any questions about your discharge medications or the care you received while you were in the hospital after you are discharged, you can call the unit and asked to speak with the hospitalist on call if the hospitalist that took care of you is not available. Once you are discharged, your primary care physician will handle any further medical issues. Please note that NO REFILLS for any discharge medications will be authorized once you are discharged, as it is imperative that you return to your primary care physician (or establish a relationship with a primary care physician if you do not have one) for your aftercare needs so that they can reassess your need for medications and monitor your lab values.   Increase activity slowly    Complete by:  As directed      Allergies as of 08/19/2016   No Known Allergies     Medication List    TAKE these medications   carvedilol 6.25 MG tablet Commonly known as:  COREG Take 1 tablet (6.25 mg total) by mouth 2 (two) times daily with a meal.   furosemide 40 MG tablet Commonly known as:  LASIX Take 1 tablet (40 mg total) by mouth 2 (two) times daily. Please take extra tablets if you gain more than 1 pound in 24 hours   guaiFENesin-dextromethorphan 100-10 MG/5ML syrup Commonly known as:  ROBITUSSIN DM Take 5 mLs by mouth every 6 (six) hours as needed for cough.   lisinopril 2.5 MG tablet Commonly known as:  PRINIVIL,ZESTRIL Take 1 tablet (2.5 mg total) by mouth daily.   loratadine 10 MG tablet Commonly known as:  CLARITIN Take 1 tablet (10 mg total) by mouth daily.   metFORMIN 500 MG tablet Commonly known as:  GLUCOPHAGE TAKE ONE TABLET BY MOUTH TWICE DAILY         Diet and Activity recommendation: See Discharge  Instructions above   Consults obtained -  cardiology   Major procedures and Radiology Reports - PLEASE review detailed and final reports for all details, in brief -    Dg Chest 2  View  Result Date: 08/18/2016 CLINICAL DATA:  Pleural effusion EXAM: CHEST  2 VIEW COMPARISON:  Chest radiograph and chest CT Aug 15, 2016 FINDINGS: There are small pleural effusions bilaterally with slight bibasilar atelectasis. Lungs elsewhere clear. Heart is upper normal in size with pulmonary vascular within normal limits. No adenopathy. No bone lesions peer. IMPRESSION: Small pleural effusions with mild bibasilar atelectatic change, essentially stable. Lungs elsewhere clear. Stable cardiac silhouette. Electronically Signed   By: Lowella Grip III M.D.   On: 08/18/2016 09:00   Dg Chest 2 View  Result Date: 08/15/2016 CLINICAL DATA:  Chest pain EXAM: CHEST  2 VIEW COMPARISON:  None. FINDINGS: Small right pleural effusion with mild right lower lobe airspace disease. Left lung is clear. Negative for heart failure or edema. Heart size upper normal. No mass lesion identified. IMPRESSION: Mild right lower lobe airspace disease and small right effusion. Differential diagnosis includes infection and pulmonary embolism. Neoplasm also in the differential. Electronically Signed   By: Franchot Gallo M.D.   On: 08/15/2016 11:47   Ct Chest W Contrast  Result Date: 08/15/2016 CLINICAL DATA:  Cough since last night. EXAM: CT CHEST WITH CONTRAST TECHNIQUE: Multidetector CT imaging of the chest was performed during intravenous contrast administration. CONTRAST:  66mL ISOVUE-300 IOPAMIDOL (ISOVUE-300) INJECTION 61% COMPARISON:  None. FINDINGS: Cardiovascular: No significant vascular findings. Normal heart size. No pericardial effusion. Normal caliber thoracic aorta. No thoracic aortic dissection. Mediastinum/Nodes: No enlarged mediastinal, hilar, or axillary lymph nodes. Thyroid gland, trachea, and esophagus demonstrate no significant  findings. Lungs/Pleura: Moderate right and small left pleural effusion. Bibasilar dependent airspace disease likely reflecting atelectasis versus less likely pneumonia. No pneumothorax. Mild bilateral interstitial thickening. Upper Abdomen: No acute upper abdominal abnormality. Musculoskeletal: No acute osseous abnormality. No lytic or sclerotic osseous lesion. IMPRESSION: 1. Moderate right and small left pleural effusion. Bibasilar dependent airspace disease likely reflecting atelectasis versus less likely pneumonia. Bilateral mild interstitial thickening suggesting an element of mild pulmonary edema. Electronically Signed   By: Kathreen Devoid   On: 08/15/2016 13:32   [place] Chest  Result Date: 08/16/2016 CLINICAL DATA:  Pleural effusions EXAM: CHEST ULTRASOUND COMPARISON:  CT chest 08/15/2016 FINDINGS: Small BILATERAL pleural effusions. Small sizes of these collections make these unlikely to expect significant symptomatic improvement following thoracentesis. Thoracentesis not performed at this time. IMPRESSION: Small BILATERAL pleural effusions. Thoracentesis not performed at this time. Electronically Signed   By: Lavonia Dana M.D.   On: 08/16/2016 13:27    Micro Results    Recent Results (from the past 240 hour(s))  Blood culture (routine x 2)     Status: None (Preliminary result)   Collection Time: 08/15/16  1:22 PM  Result Value Ref Range Status   Specimen Description BLOOD RIGHT ARM  Final   Special Requests   Final    BOTTLES DRAWN AEROBIC AND ANAEROBIC Blood Culture adequate volume   Culture NO GROWTH 3 DAYS  Final   Report Status PENDING  Incomplete  Blood culture (routine x 2)     Status: None (Preliminary result)   Collection Time: 08/15/16  1:29 PM  Result Value Ref Range Status   Specimen Description BLOOD RIGHT HAND  Final   Special Requests   Final    BOTTLES DRAWN AEROBIC ONLY Blood Culture results may not be optimal due to an inadequate volume of blood received in culture bottles    Culture NO GROWTH 3 DAYS  Final   Report Status PENDING  Incomplete  Today   Subjective:   [place] Vanzanten today has no headache,no chest or abdominal pain, did ambulate in the hallway yesterday with no dyspnea, reported good night sleep, only interrupted by occasional cough .  Objective:   Blood pressure 111/79, pulse 95, temperature 98 F (36.7 C), temperature source Oral, resp. rate 18, height 6\' 3"  (1.905 m), weight 125.2 kg (276 lb), SpO2 97 %.   Intake/Output Summary (Last 24 hours) at 08/19/16 1005 Last data filed at 08/19/16 0620  Gross per 24 hour  Intake              480 ml  Output              325 ml  Net              155 ml    Exam Awake alert oriented, answering simple questions. Supple Neck,No JVD Symmetrical Chest wall movement, air entry diminished at the bases, no use of accessory muscle, no wheezing  RRR,No Gallops,Rubs or new Murmurs, No Parasternal Heave +ve B.Sounds, Abd Soft, Non tender, No rebound -guarding or rigidity. No Cyanosis, Clubbing or edema, No new Rash or bruise  Data Review   CBC w Diff:  Lab Results  Component Value Date   WBC 10.4 08/18/2016   HGB 14.8 08/18/2016   HCT 45.4 08/18/2016   HCT 47.2 03/14/2016   PLT 201 08/18/2016   PLT 230 03/14/2016    CMP:  Lab Results  Component Value Date   NA 139 08/19/2016   NA 142 03/14/2016   K 3.9 08/19/2016   CL 101 08/19/2016   CO2 30 08/19/2016   BUN 21 (H) 08/19/2016   BUN 15 03/14/2016   CREATININE 1.49 (H) 08/19/2016   PROT 7.0 08/17/2016   PROT 6.9 03/14/2016   ALBUMIN 3.7 08/17/2016   ALBUMIN 4.0 03/14/2016   BILITOT 1.3 (H) 08/17/2016   BILITOT 0.4 03/14/2016   ALKPHOS 28 (L) 08/17/2016   AST 32 08/17/2016   ALT 46 08/17/2016  .   Total Time in preparing paper work, data evaluation and todays exam - 35 minutes  Amandalee Lacap M.D on 08/19/2016 at 10:05 AM  [facility]   Office  [phone]

## 2016-08-20 LAB — CULTURE, BLOOD (ROUTINE X 2)
CULTURE: NO GROWTH
Culture: NO GROWTH
SPECIAL REQUESTS: ADEQUATE

## 2016-08-25 ENCOUNTER — Ambulatory Visit (INDEPENDENT_AMBULATORY_CARE_PROVIDER_SITE_OTHER): Payer: Medicare Other | Admitting: Physician Assistant

## 2016-08-25 ENCOUNTER — Encounter: Payer: Self-pay | Admitting: Physician Assistant

## 2016-08-25 VITALS — BP 107/73 | HR 83 | Temp 96.6°F | Ht 75.0 in | Wt 270.0 lb

## 2016-08-25 DIAGNOSIS — I5021 Acute systolic (congestive) heart failure: Secondary | ICD-10-CM | POA: Diagnosis not present

## 2016-08-25 DIAGNOSIS — I10 Essential (primary) hypertension: Secondary | ICD-10-CM | POA: Diagnosis not present

## 2016-08-25 DIAGNOSIS — E119 Type 2 diabetes mellitus without complications: Secondary | ICD-10-CM | POA: Diagnosis not present

## 2016-08-25 DIAGNOSIS — I152 Hypertension secondary to endocrine disorders: Secondary | ICD-10-CM | POA: Insufficient documentation

## 2016-08-25 LAB — BAYER DCA HB A1C WAIVED: HB A1C (BAYER DCA - WAIVED): 6.5 % (ref <7.0)

## 2016-08-25 NOTE — Progress Notes (Signed)
BP 107/73   Pulse 83   Temp (!) 96.6 F (35.9 C) (Oral)   Ht 6' 3" (1.905 m)   Wt 270 lb (122.5 kg)   BMI 33.75 kg/m    Subjective:    Patient ID: Michael Ramos, male    DOB: 11-Jan-1971, 46 y.o.   MRN: [id number]  HPI: Michael Ramos is a 46 y.o. male presenting on 08/25/2016 for Hospitalization Follow-up (AP 5/8-5/11 Pneumonia CHF)  Patient symptoms in for hospital follow-up today. He was at [facility] last week for a episode of pneumonia and new onset congestive heart failure. He is not had previous cardiac disease or infection. It is thought that he may have had some type of infection that caused the heart failure. He will be following up with cardiology next week. He was placed on Lasix and lisinopril when he left the hospital. We need to do lab work today. He states overall he is feeling better. He is not having any difficulty with his breathing or pain. He is greatly reduce sodium in his diet and I commended him on this. He was in the presence of his mother and sister.  Relevant past medical, surgical, family and social history reviewed and updated as indicated. Allergies and medications reviewed and updated.  Past Medical History:  Diagnosis Date  . Allergy   . Autism   . CHF (congestive heart failure) ([place])   . Diabetes mellitus without complication ([place])     History reviewed. No pertinent surgical history.  Review of Systems  Constitutional: Positive for fatigue. Negative for appetite change and unexpected weight change.  HENT: Negative.   Eyes: Negative.  Negative for pain and visual disturbance.  Respiratory: Negative.  Negative for cough, chest tightness, shortness of breath and wheezing.   Cardiovascular: Negative.  Negative for chest pain, palpitations and leg swelling.  Gastrointestinal: Negative.  Negative for abdominal pain, diarrhea, nausea and vomiting.  Endocrine: Negative.   Genitourinary: Negative.     Musculoskeletal: Negative.   Skin: Negative.  Negative for color change and rash.  Neurological: Negative.  Negative for weakness, numbness and headaches.  Psychiatric/Behavioral: Negative.     Allergies as of 08/25/2016   No Known Allergies     Medication List       Accurate as of 08/25/16  3:00 PM. Always use your most recent med list.          carvedilol 6.25 MG tablet Commonly known as:  COREG Take 1 tablet (6.25 mg total) by mouth 2 (two) times daily with a meal.   furosemide 40 MG tablet Commonly known as:  LASIX Take 1 tablet (40 mg total) by mouth 2 (two) times daily. Please take extra tablets if you gain more than 1 pound in 24 hours   guaiFENesin-dextromethorphan 100-10 MG/5ML syrup Commonly known as:  ROBITUSSIN DM Take 5 mLs by mouth every 6 (six) hours as needed for cough.   lisinopril 2.5 MG tablet Commonly known as:  PRINIVIL,ZESTRIL Take 1 tablet (2.5 mg total) by mouth daily.   loratadine 10 MG tablet Commonly known as:  CLARITIN Take 1 tablet (10 mg total) by mouth daily.   metFORMIN 500 MG tablet Commonly known as:  GLUCOPHAGE TAKE ONE TABLET BY MOUTH TWICE DAILY          Objective:    BP 107/73   Pulse 83   Temp (!)  96.6 F (35.9 C) (Oral)   Ht 6' 3" (1.905 m)   Wt 270 lb (122.5 kg)   BMI 33.75 kg/m   No Known Allergies  Physical Exam  Constitutional: He appears well-developed and well-nourished.  HENT:  Head: Normocephalic and atraumatic.  Eyes: Conjunctivae and EOM are normal. Pupils are equal, round, and reactive to light.  Neck: Normal range of motion. Neck supple.  Cardiovascular: Normal rate, regular rhythm and normal heart sounds.   Pulmonary/Chest: Effort normal and breath sounds normal.  Abdominal: Soft. Bowel sounds are normal.  Musculoskeletal: Normal range of motion.  Skin: Skin is warm and dry.    Results for orders placed or performed in visit on 08/25/16  Bayer DCA Hb A1c Waived  Result Value Ref Range    Bayer DCA Hb A1c Waived 6.5 <7.0 %      Assessment & Plan:   1. Acute systolic CHF (congestive heart failure) (HCC) - CMP14+EGFR - CBC with Differential  2. Essential hypertension  - CMP14+EGFR - CBC with Differential  3. Type 2 diabetes mellitus without complication, without long-term current use of insulin (HCC)  - Bayer DCA Hb A1c Waived   Current Outpatient Prescriptions:  .  carvedilol (COREG) 6.25 MG tablet, Take 1 tablet (6.25 mg total) by mouth 2 (two) times daily with a meal., Disp: 60 tablet, Rfl: 0 .  furosemide (LASIX) 40 MG tablet, Take 1 tablet (40 mg total) by mouth 2 (two) times daily. Please take extra tablets if you gain more than 1 pound in 24 hours, Disp: 60 tablet, Rfl: 0 .  guaiFENesin-dextromethorphan (ROBITUSSIN DM) 100-10 MG/5ML syrup, Take 5 mLs by mouth every 6 (six) hours as needed for cough., Disp: 118 mL, Rfl: 0 .  lisinopril (PRINIVIL,ZESTRIL) 2.5 MG tablet, Take 1 tablet (2.5 mg total) by mouth daily., Disp: 30 tablet, Rfl: 0 .  loratadine (CLARITIN) 10 MG tablet, Take 1 tablet (10 mg total) by mouth daily., Disp: 30 tablet, Rfl: 11 .  metFORMIN (GLUCOPHAGE) 500 MG tablet, TAKE ONE TABLET BY MOUTH TWICE DAILY, Disp: 180 tablet, Rfl: 0  Continue all other maintenance medications as listed above.  Follow up plan: Return in about 3 months (around 11/25/2016) for recheck.  Educational handout given for DASH diet information  Angel S. Jones PA-C [facility] [address]  [place], [place] [address] [phone]   08/25/2016, 3:00 PM                                                                                             

## 2016-08-25 NOTE — Patient Instructions (Signed)
DASH Eating Plan DASH stands for "Dietary Approaches to Stop Hypertension." The DASH eating plan is a healthy eating plan that has been shown to reduce high blood pressure (hypertension). It may also reduce your risk for type 2 diabetes, heart disease, and stroke. The DASH eating plan may also help with weight loss. What are tips for following this plan? General guidelines  Avoid eating more than 2,300 mg (milligrams) of salt (sodium) a day. If you have hypertension, you may need to reduce your sodium intake to 1,500 mg a day.  Limit alcohol intake to no more than 1 drink a day for nonpregnant women and 2 drinks a day for men. One drink equals 12 oz of beer, 5 oz of wine, or 1 oz of hard liquor.  Work with your health care provider to maintain a healthy body weight or to lose weight. Ask what an ideal weight is for you.  Get at least 30 minutes of exercise that causes your heart to beat faster (aerobic exercise) most days of the week. Activities may include walking, swimming, or biking.  Work with your health care provider or diet and nutrition specialist (dietitian) to adjust your eating plan to your individual calorie needs. Reading food labels  Check food labels for the amount of sodium per serving. Choose foods with less than 5 percent of the Daily Value of sodium. Generally, foods with less than 300 mg of sodium per serving fit into this eating plan.  To find whole grains, look for the word "whole" as the first word in the ingredient list. Shopping  Buy products labeled as "low-sodium" or "no salt added."  Buy fresh foods. Avoid canned foods and premade or frozen meals. Cooking  Avoid adding salt when cooking. Use salt-free seasonings or herbs instead of table salt or sea salt. Check with your health care provider or pharmacist before using salt substitutes.  Do not fry foods. Cook foods using healthy methods such as baking, boiling, grilling, and broiling instead.  Cook with  heart-healthy oils, such as olive, canola, soybean, or sunflower oil. Meal planning   Eat a balanced diet that includes: ? 5 or more servings of fruits and vegetables each day. At each meal, try to fill half of your plate with fruits and vegetables. ? Up to 6-8 servings of whole grains each day. ? Less than 6 oz of lean meat, poultry, or fish each day. A 3-oz serving of meat is about the same size as a deck of cards. One egg equals 1 oz. ? 2 servings of low-fat dairy each day. ? A serving of nuts, seeds, or beans 5 times each week. ? Heart-healthy fats. Healthy fats called Omega-3 fatty acids are found in foods such as flaxseeds and coldwater fish, like sardines, salmon, and mackerel.  Limit how much you eat of the following: ? Canned or prepackaged foods. ? Food that is high in trans fat, such as fried foods. ? Food that is high in saturated fat, such as fatty meat. ? Sweets, desserts, sugary drinks, and other foods with added sugar. ? Full-fat dairy products.  Do not salt foods before eating.  Try to eat at least 2 vegetarian meals each week.  Eat more home-cooked food and less restaurant, buffet, and fast food.  When eating at a restaurant, ask that your food be prepared with less salt or no salt, if possible. What foods are recommended? The items listed may not be a complete list. Talk with your dietitian about what   dietary choices are best for you. Grains Whole-grain or whole-wheat bread. Whole-grain or whole-wheat pasta. Brown rice. Oatmeal. Quinoa. Bulgur. Whole-grain and low-sodium cereals. Pita bread. Low-fat, low-sodium crackers. Whole-wheat flour tortillas. Vegetables Fresh or frozen vegetables (raw, steamed, roasted, or grilled). Low-sodium or reduced-sodium tomato and vegetable juice. Low-sodium or reduced-sodium tomato sauce and tomato paste. Low-sodium or reduced-sodium canned vegetables. Fruits All fresh, dried, or frozen fruit. Canned fruit in natural juice (without  added sugar). Meat and other protein foods Skinless chicken or turkey. Ground chicken or turkey. Pork with fat trimmed off. Fish and seafood. Egg whites. Dried beans, peas, or lentils. Unsalted nuts, nut butters, and seeds. Unsalted canned beans. Lean cuts of beef with fat trimmed off. Low-sodium, lean deli meat. Dairy Low-fat (1%) or fat-free (skim) milk. Fat-free, low-fat, or reduced-fat cheeses. Nonfat, low-sodium ricotta or cottage cheese. Low-fat or nonfat yogurt. Low-fat, low-sodium cheese. Fats and oils Soft margarine without trans fats. Vegetable oil. Low-fat, reduced-fat, or light mayonnaise and salad dressings (reduced-sodium). Canola, safflower, olive, soybean, and sunflower oils. Avocado. Seasoning and other foods Herbs. Spices. Seasoning mixes without salt. Unsalted popcorn and pretzels. Fat-free sweets. What foods are not recommended? The items listed may not be a complete list. Talk with your dietitian about what dietary choices are best for you. Grains Baked goods made with fat, such as croissants, muffins, or some breads. Dry pasta or rice meal packs. Vegetables Creamed or fried vegetables. Vegetables in a cheese sauce. Regular canned vegetables (not low-sodium or reduced-sodium). Regular canned tomato sauce and paste (not low-sodium or reduced-sodium). Regular tomato and vegetable juice (not low-sodium or reduced-sodium). Pickles. Olives. Fruits Canned fruit in a light or heavy syrup. Fried fruit. Fruit in cream or butter sauce. Meat and other protein foods Fatty cuts of meat. Ribs. Fried meat. Bacon. Sausage. Bologna and other processed lunch meats. Salami. Fatback. Hotdogs. Bratwurst. Salted nuts and seeds. Canned beans with added salt. Canned or smoked fish. Whole eggs or egg yolks. Chicken or turkey with skin. Dairy Whole or 2% milk, cream, and half-and-half. Whole or full-fat cream cheese. Whole-fat or sweetened yogurt. Full-fat cheese. Nondairy creamers. Whipped toppings.  Processed cheese and cheese spreads. Fats and oils Butter. Stick margarine. Lard. Shortening. Ghee. Bacon fat. Tropical oils, such as coconut, palm kernel, or palm oil. Seasoning and other foods Salted popcorn and pretzels. Onion salt, garlic salt, seasoned salt, table salt, and sea salt. Worcestershire sauce. Tartar sauce. Barbecue sauce. Teriyaki sauce. Soy sauce, including reduced-sodium. Steak sauce. Canned and packaged gravies. Fish sauce. Oyster sauce. Cocktail sauce. Horseradish that you find on the shelf. Ketchup. Mustard. Meat flavorings and tenderizers. Bouillon cubes. Hot sauce and Tabasco sauce. Premade or packaged marinades. Premade or packaged taco seasonings. Relishes. Regular salad dressings. Where to find more information:  National Heart, Lung, and Blood Institute: www.nhlbi.nih.gov  American Heart Association: www.heart.org Summary  The DASH eating plan is a healthy eating plan that has been shown to reduce high blood pressure (hypertension). It may also reduce your risk for type 2 diabetes, heart disease, and stroke.  With the DASH eating plan, you should limit salt (sodium) intake to 2,300 mg a day. If you have hypertension, you may need to reduce your sodium intake to 1,500 mg a day.  When on the DASH eating plan, aim to eat more fresh fruits and vegetables, whole grains, lean proteins, low-fat dairy, and heart-healthy fats.  Work with your health care provider or diet and nutrition specialist (dietitian) to adjust your eating plan to your individual   calorie needs. This information is not intended to replace advice given to you by your health care provider. Make sure you discuss any questions you have with your health care provider. Document Released: 03/16/2011 Document Revised: 03/20/2016 Document Reviewed: 03/20/2016 Elsevier Interactive Patient Education  2017 Elsevier Inc.  

## 2016-08-26 LAB — CBC WITH DIFFERENTIAL/PLATELET
BASOS: 1 %
Basophils Absolute: 0.1 10*3/uL (ref 0.0–0.2)
EOS (ABSOLUTE): 0.2 10*3/uL (ref 0.0–0.4)
Eos: 3 %
Hematocrit: 42.6 % (ref 37.5–51.0)
Hemoglobin: 14.1 g/dL (ref 13.0–17.7)
Immature Grans (Abs): 0 10*3/uL (ref 0.0–0.1)
Immature Granulocytes: 0 %
Lymphocytes Absolute: 1.7 10*3/uL (ref 0.7–3.1)
Lymphs: 23 %
MCH: 28.7 pg (ref 26.6–33.0)
MCHC: 33.1 g/dL (ref 31.5–35.7)
MCV: 87 fL (ref 79–97)
MONOS ABS: 0.6 10*3/uL (ref 0.1–0.9)
Monocytes: 9 %
NEUTROS ABS: 4.6 10*3/uL (ref 1.4–7.0)
Neutrophils: 64 %
PLATELETS: 192 10*3/uL (ref 150–379)
RBC: 4.92 x10E6/uL (ref 4.14–5.80)
RDW: 14.4 % (ref 12.3–15.4)
WBC: 7.2 10*3/uL (ref 3.4–10.8)

## 2016-08-26 LAB — CMP14+EGFR
A/G RATIO: 1.6 (ref 1.2–2.2)
ALBUMIN: 4.2 g/dL (ref 3.5–5.5)
ALT: 28 IU/L (ref 0–44)
AST: 20 IU/L (ref 0–40)
Alkaline Phosphatase: 26 IU/L — ABNORMAL LOW (ref 39–117)
BUN/Creatinine Ratio: 17 (ref 9–20)
BUN: 25 mg/dL — ABNORMAL HIGH (ref 6–24)
Bilirubin Total: 0.4 mg/dL (ref 0.0–1.2)
CO2: 25 mmol/L (ref 18–29)
Calcium: 9.4 mg/dL (ref 8.7–10.2)
Chloride: 99 mmol/L (ref 96–106)
Creatinine, Ser: 1.45 mg/dL — ABNORMAL HIGH (ref 0.76–1.27)
GFR, EST AFRICAN AMERICAN: 67 mL/min/{1.73_m2} (ref >59)
GFR, EST NON AFRICAN AMERICAN: 58 mL/min/{1.73_m2} — AB (ref >59)
Globulin, Total: 2.6 g/dL (ref 1.5–4.5)
Glucose: 199 mg/dL — ABNORMAL HIGH (ref 65–99)
POTASSIUM: 4.4 mmol/L (ref 3.5–5.2)
SODIUM: 141 mmol/L (ref 134–144)
TOTAL PROTEIN: 6.8 g/dL (ref 6.0–8.5)

## 2016-08-28 ENCOUNTER — Ambulatory Visit (INDEPENDENT_AMBULATORY_CARE_PROVIDER_SITE_OTHER): Payer: Medicare Other | Admitting: Cardiology

## 2016-08-28 ENCOUNTER — Encounter: Payer: Self-pay | Admitting: Cardiology

## 2016-08-28 ENCOUNTER — Telehealth: Payer: Self-pay | Admitting: Physician Assistant

## 2016-08-28 DIAGNOSIS — F840 Autistic disorder: Secondary | ICD-10-CM

## 2016-08-28 DIAGNOSIS — E119 Type 2 diabetes mellitus without complications: Secondary | ICD-10-CM | POA: Diagnosis not present

## 2016-08-28 DIAGNOSIS — I5021 Acute systolic (congestive) heart failure: Secondary | ICD-10-CM | POA: Diagnosis not present

## 2016-08-28 DIAGNOSIS — I420 Dilated cardiomyopathy: Secondary | ICD-10-CM | POA: Insufficient documentation

## 2016-08-28 DIAGNOSIS — N189 Chronic kidney disease, unspecified: Secondary | ICD-10-CM | POA: Diagnosis not present

## 2016-08-28 DIAGNOSIS — N289 Disorder of kidney and ureter, unspecified: Secondary | ICD-10-CM | POA: Diagnosis not present

## 2016-08-28 MED ORDER — FUROSEMIDE 40 MG PO TABS: 40.0000 mg | ORAL_TABLET | Freq: Every day | ORAL | 3 refills | Status: DC

## 2016-08-28 NOTE — Assessment & Plan Note (Signed)
EF 20-25% with global HK- etiology not yet determined

## 2016-08-28 NOTE — Assessment & Plan Note (Signed)
On Metformin 

## 2016-08-28 NOTE — Telephone Encounter (Signed)
Sister [name] on [facility] aware of lab results

## 2016-08-28 NOTE — Progress Notes (Signed)
08/28/2016 [name]   08/21/1970  [id number]  Primary Physician Terald Sleeper, PA-C Primary Cardiologist: Harl Bowie  HPI:  46 y/o male with autism, admitted 08/15/16 with chest pain and SOB. He was found to have a cardiomyopathy with an EF of 20-25% with diffuse AK. He was diuresed-only about 2L- with improvement. ACE, Coreg, and Lasix started. He say Particia Nearing at [facility] 08/25/16 and labs were drawn. His BUN is 25, SCr 1.45. Symptomatically the pt seems to be doing well. He has a dry cough but he had this prior to admission (? Secondary to GERD).    Current Outpatient Prescriptions  Medication Sig Dispense Refill  . carvedilol (COREG) 6.25 MG tablet Take 1 tablet (6.25 mg total) by mouth 2 (two) times daily with a meal. 60 tablet 0  . Dextromethorphan-Menthol (DELSYM COUGH RELIEF MT) Use as directed in the mouth or throat.    Marland Kitchen lisinopril (PRINIVIL,ZESTRIL) 2.5 MG tablet Take 1 tablet (2.5 mg total) by mouth daily. 30 tablet 0  . loratadine (CLARITIN) 10 MG tablet Take 1 tablet (10 mg total) by mouth daily. 30 tablet 11  . metFORMIN (GLUCOPHAGE) 500 MG tablet TAKE ONE TABLET BY MOUTH TWICE DAILY 180 tablet 0  . furosemide (LASIX) 40 MG tablet Take 1 tablet (40 mg total) by mouth daily. 90 tablet 3   No current facility-administered medications for this visit.     No Known Allergies  Past Medical History:  Diagnosis Date  . Allergy   . Autism   . CHF (congestive heart failure) ([place])   . Diabetes mellitus without complication [facility])     Social History   Social History  . Marital status: Single    Spouse name: N/A  . Number of children: N/A  . Years of education: N/A   Occupational History  . Not on file.   Social History Main Topics  . Smoking status: Never Smoker  . Smokeless tobacco: Never Used  . Alcohol use No  . Drug use: No  . Sexual activity: Not Currently   Other Topics Concern  . Not on file   Social History Narrative  . No narrative on file      Family History  Problem Relation Age of Onset  . Diabetes Mother   . Cancer Father   . Diabetes Father      Review of Systems: General: negative for chills, fever, night sweats or weight changes.  Cardiovascular: negative for chest pain, dyspnea on exertion, edema, orthopnea, palpitations, paroxysmal nocturnal dyspnea or shortness of breath Dermatological: negative for rash Respiratory: negative for cough or wheezing Urologic: negative for hematuria Abdominal: negative for nausea, vomiting, diarrhea, bright red blood per rectum, melena, or hematemesis Neurologic: negative for visual changes, syncope, or dizziness All other systems reviewed and are otherwise negative except as noted above.    Blood pressure 104/64, pulse 87, height 6\' 3"  (1.905 m), weight 269 lb (122 kg), SpO2 97 %.  General appearance: alert, cooperative, no distress and moderately obese Neck: no JVD Lungs: clear to auscultation bilaterally Heart: regular rate and rhythm Extremities: no edema Neurologic: Grossly normal   ASSESSMENT AND PLAN:   Acute systolic CHF (congestive heart failure) (HCC) Pt admitted with chest pain and mild CHF-found to have LVD   Dilated cardiomyopathy ([place]) EF 20-25% with global HK- etiology not yet determined  Autism History from family  Type 2 diabetes mellitus without complication, without long-term current use of insulin (HCC) On Metformin  Acute on chronic  renal insufficiency SCr still higher than what looks to be his baseline-(1.27). Will decrease Lasix to 40 mg daily   PLAN  I cut his Lasix back to 40 mg daily. He'll f/u with Dr Harl Bowie in 3-4 weeks-  Kerin Ransom PA-C 08/28/2016 3:58 PM

## 2016-08-28 NOTE — Assessment & Plan Note (Signed)
History from family

## 2016-08-28 NOTE — Patient Instructions (Signed)
Your physician recommends that you schedule a follow-up appointment in: 3-4 Weeks with Dr. Harl Bowie   Your physician has recommended you make the following change in your medication:   Decrease Lasix to 40 mg Daily   If you need a refill on your cardiac medications before your next appointment, please call your pharmacy.  Thank you for choosing [facility]!

## 2016-08-28 NOTE — Assessment & Plan Note (Signed)
Pt admitted with chest pain and mild CHF-found to have LVD

## 2016-08-28 NOTE — Assessment & Plan Note (Signed)
SCr still higher than what looks to be his baseline-(1.27). Will decrease Lasix to 40 mg daily

## 2016-09-16 ENCOUNTER — Other Ambulatory Visit: Payer: Self-pay | Admitting: Physician Assistant

## 2016-09-18 ENCOUNTER — Other Ambulatory Visit: Payer: Self-pay | Admitting: Physician Assistant

## 2016-09-21 ENCOUNTER — Ambulatory Visit (INDEPENDENT_AMBULATORY_CARE_PROVIDER_SITE_OTHER): Payer: Medicare Other | Admitting: Cardiology

## 2016-09-21 ENCOUNTER — Encounter: Payer: Self-pay | Admitting: Cardiology

## 2016-09-21 VITALS — BP 98/64 | HR 86 | Ht 75.0 in | Wt 270.0 lb

## 2016-09-21 DIAGNOSIS — I5022 Chronic systolic (congestive) heart failure: Secondary | ICD-10-CM

## 2016-09-21 DIAGNOSIS — Z79899 Other long term (current) drug therapy: Secondary | ICD-10-CM

## 2016-09-21 NOTE — Patient Instructions (Signed)
Medication Instructions:  Your physician recommends that you continue on your current medications as directed. Please refer to the Current Medication list given to you today.   Labwork: Your physician recommends that you return for lab work in: [place]    Testing/Procedures: NONE  Follow-Up: Your physician recommends that you schedule a follow-up appointment in: 2 MONTHS    Any Other Special Instructions Will Be Listed Below (If Applicable).     If you need a refill on your cardiac medications before your next appointment, please call your pharmacy.

## 2016-09-21 NOTE — Progress Notes (Signed)
Clinical Summary Michael Ramos is a 46 y.o.male seen today for follow up of the following medical problems.   1. Chronic systolic HF - new diagnosis during 08/2016 admission with fluid overload - 08/2016 echo LVEF 20-25%. He was diuresed and started on medical therapy. Discharge weight listed at 276 lbs.  - medical therapy initially limited due to some renal dysfunction. Lasix was decreased last visit due to ongoing renal dysfunction. Baseline Cr around 1.27 - soft bp's have also limited medical therapy  - no SOB or DOE. No LE edema - compliant with meds - no orthostatic symptoms.  - limiting sodium intake.    Past Medical History:  Diagnosis Date  . Allergy   . Autism   . CHF (congestive heart failure) ([place])   . Diabetes mellitus without complication ([place])      No Known Allergies   Current Outpatient Prescriptions  Medication Sig Dispense Refill  . carvedilol (COREG) 6.25 MG tablet TAKE ONE TABLET TWICE A DAY WITH FOOD 180 tablet 1  . Dextromethorphan-Menthol (DELSYM COUGH RELIEF MT) Use as directed in the mouth or throat.    . furosemide (LASIX) 40 MG tablet Take 1 tablet (40 mg total) by mouth daily. 90 tablet 3  . lisinopril (PRINIVIL,ZESTRIL) 2.5 MG tablet TAKE ONE (1) TABLET EACH DAY 90 tablet 1  . loratadine (CLARITIN) 10 MG tablet Take 1 tablet (10 mg total) by mouth daily. 30 tablet 11  . metFORMIN (GLUCOPHAGE) 500 MG tablet TAKE ONE TABLET BY MOUTH TWICE DAILY 180 tablet 0   No current facility-administered medications for this visit.      No past surgical history on file.   No Known Allergies    Family History  Problem Relation Age of Onset  . Diabetes Mother   . Cancer Father   . Diabetes Father      Social History Michael Ramos reports that he has never smoked. He has never used smokeless tobacco. Michael Ramos reports that he does not drink alcohol.   Review of Systems CONSTITUTIONAL: No weight loss, fever, chills, weakness or fatigue.  HEENT:  Eyes: No visual loss, blurred vision, double vision or yellow sclerae.No hearing loss, sneezing, congestion, runny nose or sore throat.  SKIN: No rash or itching.  CARDIOVASCULAR: per hpi RESPIRATORY: No shortness of breath, cough or sputum.  GASTROINTESTINAL: No anorexia, nausea, vomiting or diarrhea. No abdominal pain or blood.  GENITOURINARY: No burning on urination, no polyuria NEUROLOGICAL: No headache, dizziness, syncope, paralysis, ataxia, numbness or tingling in the extremities. No change in bowel or bladder control.  MUSCULOSKELETAL: No muscle, back pain, joint pain or stiffness.  LYMPHATICS: No enlarged nodes. No history of splenectomy.  PSYCHIATRIC: No history of depression or anxiety.  ENDOCRINOLOGIC: No reports of sweating, cold or heat intolerance. No polyuria or polydipsia.  Marland Kitchen   Physical Examination Vitals:   09/21/16 1054  BP: 98/64  Pulse: 86   Vitals:   09/21/16 1054  Weight: 270 lb (122.5 kg)  Height: 6\' 3"  (1.905 m)    Gen: resting comfortably, no acute distress HEENT: no scleral icterus, pupils equal round and reactive, no palptable cervical adenopathy,  CV: RRR, no m/r/g, no jvd Resp: Clear to auscultation bilaterally GI: abdomen is soft, non-tender, non-distended, normal bowel sounds, no hepatosplenomegaly MSK: extremities are warm, no edema.  Skin: warm, no rash Neuro:  no focal deficits Psych: appropriate affect     Assessment and Plan  1. Chronic systolic HF - no current symptoms, appears  euvolemic - medical therapy limited by soft bp's. No med changes today. Repeat BMET/Mg, in 2 weeks - discuss again possible OSA testing at next visit, patient not in favor at this time.  - repeat echo in next few months   F/u 2 months     Arnoldo Lenis, M.D

## 2016-09-26 ENCOUNTER — Telehealth: Payer: Self-pay | Admitting: Cardiology

## 2016-09-26 DIAGNOSIS — Z79899 Other long term (current) drug therapy: Secondary | ICD-10-CM

## 2016-09-26 NOTE — Telephone Encounter (Signed)
Janett Billow from [facility] called saying she's needing the pt's lab orders changed from [facility] to [facility] that way they can be drawn in that office

## 2016-09-26 NOTE — Telephone Encounter (Signed)
Changed order to [facility]

## 2016-10-05 ENCOUNTER — Other Ambulatory Visit: Payer: Medicare Other

## 2016-10-05 DIAGNOSIS — Z79899 Other long term (current) drug therapy: Secondary | ICD-10-CM | POA: Diagnosis not present

## 2016-10-06 LAB — BASIC METABOLIC PANEL
BUN / CREAT RATIO: 9 (ref 9–20)
BUN: 11 mg/dL (ref 6–24)
CALCIUM: 8.9 mg/dL (ref 8.7–10.2)
CHLORIDE: 100 mmol/L (ref 96–106)
CO2: 23 mmol/L (ref 20–29)
Creatinine, Ser: 1.28 mg/dL — ABNORMAL HIGH (ref 0.76–1.27)
GFR calc Af Amer: 78 mL/min/{1.73_m2} (ref >59)
GFR calc non Af Amer: 68 mL/min/{1.73_m2} (ref >59)
GLUCOSE: 130 mg/dL — AB (ref 65–99)
Potassium: 4.7 mmol/L (ref 3.5–5.2)
Sodium: 140 mmol/L (ref 134–144)

## 2016-10-06 LAB — MAGNESIUM: MAGNESIUM: 2 mg/dL (ref 1.6–2.3)

## 2016-10-10 ENCOUNTER — Other Ambulatory Visit: Payer: Medicare Other

## 2016-11-04 ENCOUNTER — Other Ambulatory Visit: Payer: Self-pay | Admitting: Physician Assistant

## 2016-11-04 DIAGNOSIS — E119 Type 2 diabetes mellitus without complications: Secondary | ICD-10-CM

## 2016-12-06 ENCOUNTER — Encounter: Payer: Self-pay | Admitting: Cardiology

## 2016-12-06 ENCOUNTER — Ambulatory Visit (INDEPENDENT_AMBULATORY_CARE_PROVIDER_SITE_OTHER): Payer: Medicare Other | Admitting: Cardiology

## 2016-12-06 VITALS — BP 114/82 | HR 86 | Ht 75.0 in | Wt 278.0 lb

## 2016-12-06 DIAGNOSIS — G4730 Sleep apnea, unspecified: Secondary | ICD-10-CM | POA: Diagnosis not present

## 2016-12-06 DIAGNOSIS — I5022 Chronic systolic (congestive) heart failure: Secondary | ICD-10-CM | POA: Diagnosis not present

## 2016-12-06 MED ORDER — CARVEDILOL 12.5 MG PO TABS: 12.5000 mg | ORAL_TABLET | Freq: Two times a day (BID) | ORAL | 3 refills | Status: DC

## 2016-12-06 NOTE — Progress Notes (Signed)
Clinical Summary Mr. Postema is a 46 y.o.male seen today for follow up of the following medical problems.   1. Chronic systolic HF - new diagnosis during 08/2016 admission with fluid overload - 08/2016 echo LVEF 20-25%. He was diuresed and started on medical therapy. Discharge weight listed at 276 lbs.  - medical therapy initially limited due to some renal dysfunction. Lasix was decreased last visit due to ongoing renal dysfunction. Baseline Cr around 1.27 - soft bp's have also limited medical therapy  - we did not pursue catha at time of diagnosis in absence of signs of acute ischemia, as well as poor renal function. Over time renal function has been improving.     - no SOB or DOE. No LE edema - compliant with meds. Weights at home are 278.  2. OSA screen - was not in favor after discussing at last visit Past Medical History:  Diagnosis Date  . Allergy   . Autism   . CHF (congestive heart failure) ([place])   . Diabetes mellitus without complication ([place])      No Known Allergies   Current Outpatient Prescriptions  Medication Sig Dispense Refill  . carvedilol (COREG) 6.25 MG tablet TAKE ONE TABLET TWICE A DAY WITH FOOD 180 tablet 1  . Dextromethorphan-Menthol (DELSYM COUGH RELIEF MT) Use as directed in the mouth or throat.    . furosemide (LASIX) 40 MG tablet Take 1 tablet (40 mg total) by mouth daily. 90 tablet 3  . lisinopril (PRINIVIL,ZESTRIL) 2.5 MG tablet TAKE ONE (1) TABLET EACH DAY 90 tablet 1  . loratadine (CLARITIN) 10 MG tablet Take 1 tablet (10 mg total) by mouth daily. 30 tablet 11  . metFORMIN (GLUCOPHAGE) 500 MG tablet TAKE ONE TABLET BY MOUTH TWICE DAILY 180 tablet 0   No current facility-administered medications for this visit.      No past surgical history on file.   No Known Allergies    Family History  Problem Relation Age of Onset  . Diabetes Mother   . Cancer Father   . Diabetes Father      Social History Mr. Mcdermid reports that he  has never smoked. He has never used smokeless tobacco. Mr. Birkeland reports that he does not drink alcohol.   Review of Systems CONSTITUTIONAL: No weight loss, fever, chills, weakness or fatigue.  HEENT: Eyes: No visual loss, blurred vision, double vision or yellow sclerae.No hearing loss, sneezing, congestion, runny nose or sore throat.  SKIN: No rash or itching.  CARDIOVASCULAR: per hpi RESPIRATORY: No shortness of breath, cough or sputum.  GASTROINTESTINAL: No anorexia, nausea, vomiting or diarrhea. No abdominal pain or blood.  GENITOURINARY: No burning on urination, no polyuria NEUROLOGICAL: No headache, dizziness, syncope, paralysis, ataxia, numbness or tingling in the extremities. No change in bowel or bladder control.  MUSCULOSKELETAL: No muscle, back pain, joint pain or stiffness.  LYMPHATICS: No enlarged nodes. No history of splenectomy.  PSYCHIATRIC: No history of depression or anxiety.  ENDOCRINOLOGIC: No reports of sweating, cold or heat intolerance. No polyuria or polydipsia.  Marland Kitchen   Physical Examination Vitals:   12/06/16 1546  BP: 114/82  Pulse: 86  SpO2: 96%   Vitals:   12/06/16 1546  Weight: 278 lb (126.1 kg)  Height: 6\' 3"  (1.905 m)    Gen: resting comfortably, no acute distress HEENT: no scleral icterus, pupils equal round and reactive, no palptable cervical adenopathy,  CV: RRR, no m/r/g, no jvd Resp: Clear to auscultation bilaterally GI: abdomen is  soft, non-tender, non-distended, normal bowel sounds, no hepatosplenomegaly MSK: extremities are warm, no edema.  Skin: warm, no rash Neuro:  no focal deficits Psych: appropriate affect   Diagnostic Studies     Assessment and Plan  1. Chronic systolic HF - no current symptoms - we will increase coreg to 12.5mg  bid, nursing visit in 2 weeks for vitals check - likely repeat echo in near future. If persistent dysfunction consider cath  2. OSA screen - refer to Dr Luan Pulling.    F/u 1  month      Arnoldo Lenis, M.D.

## 2016-12-06 NOTE — Patient Instructions (Signed)
Medication Instructions:  INCREASE COREG TO 12.5 MG TWO TIMES DAILY   Labwork: NONE  Testing/Procedures: You have been referred to DR. HAWKINS    Follow-Up: Your physician recommends that you schedule a follow-up appointment in: [place]  Your physician recommends that you schedule a follow-up appointment in: [place]. [place]     Any Other Special Instructions Will Be Listed Below (If Applicable).     If you need a refill on your cardiac medications before your next appointment, please call your pharmacy.

## 2016-12-13 ENCOUNTER — Ambulatory Visit (INDEPENDENT_AMBULATORY_CARE_PROVIDER_SITE_OTHER): Payer: Medicare Other

## 2016-12-13 VITALS — BP 124/80 | HR 84 | Ht 75.0 in | Wt 282.0 lb

## 2016-12-13 DIAGNOSIS — I10 Essential (primary) hypertension: Secondary | ICD-10-CM | POA: Diagnosis not present

## 2016-12-13 NOTE — Progress Notes (Signed)
Pt came in office today with no complaints. He has not had any dizziness, headaches or fatigue since medication change. I will forward note to Dr. Harl Bowie.

## 2016-12-14 NOTE — Progress Notes (Signed)
Numbers look good, please increase coreg to 25mg  bid  Zandra Abts MD

## 2017-01-10 ENCOUNTER — Ambulatory Visit (INDEPENDENT_AMBULATORY_CARE_PROVIDER_SITE_OTHER): Payer: Medicare Other

## 2017-01-10 DIAGNOSIS — Z23 Encounter for immunization: Secondary | ICD-10-CM

## 2017-01-10 NOTE — Addendum Note (Signed)
Addended by: Rolena Infante on: 01/10/2017 03:20 PM   Modules accepted: Orders

## 2017-01-11 ENCOUNTER — Ambulatory Visit (INDEPENDENT_AMBULATORY_CARE_PROVIDER_SITE_OTHER): Payer: Medicare Other | Admitting: Cardiology

## 2017-01-11 ENCOUNTER — Encounter: Payer: Self-pay | Admitting: Cardiology

## 2017-01-11 VITALS — BP 116/78 | HR 71 | Ht 75.0 in | Wt 281.0 lb

## 2017-01-11 DIAGNOSIS — I5022 Chronic systolic (congestive) heart failure: Secondary | ICD-10-CM | POA: Diagnosis not present

## 2017-01-11 MED ORDER — CARVEDILOL 25 MG PO TABS: 25.0000 mg | ORAL_TABLET | Freq: Two times a day (BID) | ORAL | 3 refills | Status: DC

## 2017-01-11 NOTE — Progress Notes (Signed)
Clinical Summary Mr. Dietze is a 46 y.o.male seen today for follow up of the following medical problems.   1. Chronic systolic HF - new diagnosis during 08/2016 admission with fluid overload - 08/2016 echo LVEF 20-25%. He was diuresed and started on medical therapy. Discharge weight listed at 276 lbs.  - medical therapy initially limited due to some renal dysfunction. Lasix was decreased last visit due to ongoing renal dysfunction. Baseline Cr around 1.27 - soft bp's have also limited medical therapy  - we did not pursue catha at time of diagnosis in absence of signs of acute ischemia, as well as poor renal function. Over time renal function has been improving.    - we had discussed increasing coreg to 25mg  bid but appears he remains on the 12.5mg  bid dosing.  - no SOB. Denies any edema - compliant with meds   2. OSA screen - has appt coming up with Dr Luan Pulling.   Past Medical History:  Diagnosis Date  . Allergy   . Autism   . CHF (congestive heart failure) ([place])   . Diabetes mellitus without complication ([place])      No Known Allergies   Current Outpatient Prescriptions  Medication Sig Dispense Refill  . carvedilol (COREG) 12.5 MG tablet Take 1 tablet (12.5 mg total) by mouth 2 (two) times daily. 180 tablet 3  . Dextromethorphan-Menthol (DELSYM COUGH RELIEF MT) Use as directed in the mouth or throat.    Marland Kitchen lisinopril (PRINIVIL,ZESTRIL) 2.5 MG tablet TAKE ONE (1) TABLET EACH DAY 90 tablet 1  . loratadine (CLARITIN) 10 MG tablet Take 1 tablet (10 mg total) by mouth daily. 30 tablet 11  . metFORMIN (GLUCOPHAGE) 500 MG tablet TAKE ONE TABLET BY MOUTH TWICE DAILY 180 tablet 0  . furosemide (LASIX) 40 MG tablet Take 40 mg by mouth daily.      No current facility-administered medications for this visit.      History reviewed. No pertinent surgical history.   No Known Allergies    Family History  Problem Relation Age of Onset  . Diabetes Mother   . Cancer Father    . Diabetes Father      Social History Mr. Tippin reports that he has never smoked. He has never used smokeless tobacco. Mr. Reinhardt reports that he does not drink alcohol.   Review of Systems CONSTITUTIONAL: No weight loss, fever, chills, weakness or fatigue.  HEENT: Eyes: No visual loss, blurred vision, double vision or yellow sclerae.No hearing loss, sneezing, congestion, runny nose or sore throat.  SKIN: No rash or itching.  CARDIOVASCULAR: per hpi RESPIRATORY: No shortness of breath, cough or sputum.  GASTROINTESTINAL: No anorexia, nausea, vomiting or diarrhea. No abdominal pain or blood.  GENITOURINARY: No burning on urination, no polyuria NEUROLOGICAL: No headache, dizziness, syncope, paralysis, ataxia, numbness or tingling in the extremities. No change in bowel or bladder control.  MUSCULOSKELETAL: No muscle, back pain, joint pain or stiffness.  LYMPHATICS: No enlarged nodes. No history of splenectomy.  PSYCHIATRIC: No history of depression or anxiety.  ENDOCRINOLOGIC: No reports of sweating, cold or heat intolerance. No polyuria or polydipsia.  Marland Kitchen   Physical Examination Vitals:   01/11/17 1144  BP: 116/78  Pulse: 71  SpO2: 96%   Filed Weights   01/11/17 1144  Weight: 281 lb (127.5 kg)    Gen: resting comfortably, no acute distress HEENT: no scleral icterus, pupils equal round and reactive, no palptable cervical adenopathy,  CV: RRR, no m/r/g, no jvd  Resp: Clear to auscultation bilaterally GI: abdomen is soft, non-tender, non-distended, normal bowel sounds, no hepatosplenomegaly MSK: extremities are warm, no edema.  Skin: warm, no rash Neuro:  no focal deficits Psych: appropriate affect   Diagnostic Studies 08/2016 echo Left ventricle: The cavity size was mildly dilated. Wall   thickness was normal. Systolic function was severely reduced. The   estimated ejection fraction was in the range of 20% to 25%.   Diffuse hypokinesis. Diastolic function is  abnormal,   indeterminant grade. - Aortic valve: Valve area (VTI): 1.98 cm^2. Valve area (Vmax):   2.01 cm^2. - Mitral valve: There was mild regurgitation. - Inferior vena cava: The vessel was dilated. The respirophasic   diameter changes were blunted (< 50%), consistent with elevated   central venous pressure. - Pericardium, extracardiac: There is a small circumferential   pericardial effusion. There is no evidence of tamponade   physiology by echo. Large left pleural effusion. - Technically difficult study.     Assessment and Plan   1. Chronic systolic HF - no recent symptoms - we will increase coreg to 25mg  bid   2. OSA screen - has appt with pulmonary coming up for evaluation   Nursing visit in 2 weeks for vitals check, f/u in clinic 1 month    Arnoldo Lenis, M.D

## 2017-01-11 NOTE — Patient Instructions (Signed)
Medication Instructions:  INCREASE COREG TO 25 MG - TWO TIMES DAILY   Labwork: NONE  Testing/Procedures: NONE  Follow-Up: Your physician recommends that you schedule a follow-up appointment in: 2 WEEKS FOR NURSE VISIT FOR BLOOD PRESSURE CHECK AND MEDICATION REVIEW Your physician recommends that you schedule a follow-up appointment in: 1 MONTH     Any Other Special Instructions Will Be Listed Below (If Applicable).     If you need a refill on your cardiac medications before your next appointment, please call your pharmacy.

## 2017-01-25 ENCOUNTER — Ambulatory Visit (INDEPENDENT_AMBULATORY_CARE_PROVIDER_SITE_OTHER): Payer: Medicare Other | Admitting: *Deleted

## 2017-01-25 VITALS — BP 130/84 | HR 95 | Wt 279.4 lb

## 2017-01-25 DIAGNOSIS — I10 Essential (primary) hypertension: Secondary | ICD-10-CM

## 2017-01-25 NOTE — Progress Notes (Signed)
Pt does not have any complaints at this time. He is in office with mother who is his caregiver. She states she has not seen any changes since medication change.

## 2017-01-26 NOTE — Progress Notes (Signed)
Numbers look good, please increase lisinopril to 5mg  daily   Zandra Abts MD

## 2017-01-31 ENCOUNTER — Telehealth: Payer: Self-pay | Admitting: *Deleted

## 2017-01-31 MED ORDER — LISINOPRIL 5 MG PO TABS: 5.0000 mg | ORAL_TABLET | Freq: Every day | ORAL | 3 refills | Status: DC

## 2017-01-31 NOTE — Telephone Encounter (Signed)
Pt sister called to ask what dose of Carvedilol pt should be on. When instructed that pt is to be on 25 mg Two times daily, sister stated that she misunderstood the dosage. She thought the pt is to be on 25 mg of Coreg Daily ( 12.5 mg in the AM and 12.5 mg in the PM). Informed sister that pt is to take 25 mg in the AM and 25 mg in the PM. When asked sister states that pt was not taking the correct dose at last BP check. Please advise.

## 2017-01-31 NOTE — Telephone Encounter (Signed)
[name] notified of med change

## 2017-01-31 NOTE — Telephone Encounter (Signed)
-----   Message from Arnoldo Lenis, MD sent at 01/26/2017 12:42 PM EDT -----   ----- Message ----- From: Levonne Hubert, LPN Sent: [id number]   3:04 PM To: Arnoldo Lenis, MD

## 2017-01-31 NOTE — Telephone Encounter (Signed)
Please give pt's a sister a call--she has a question about the medication change

## 2017-02-01 NOTE — Telephone Encounter (Signed)
I think based on his numbers he will tolerate taking coreg 25mg  bid and the increase in lisinopril we just made as well.  Zandra Abts MD

## 2017-02-01 NOTE — Telephone Encounter (Signed)
I spoke with both mother and patient's sister and clarified that Coreg was 25 mg twice a day

## 2017-02-12 ENCOUNTER — Ambulatory Visit (INDEPENDENT_AMBULATORY_CARE_PROVIDER_SITE_OTHER): Payer: Medicare Other | Admitting: Cardiology

## 2017-02-12 ENCOUNTER — Encounter: Payer: Self-pay | Admitting: Cardiology

## 2017-02-12 VITALS — BP 118/72 | HR 88 | Ht 75.0 in | Wt 284.0 lb

## 2017-02-12 DIAGNOSIS — I5022 Chronic systolic (congestive) heart failure: Secondary | ICD-10-CM | POA: Diagnosis not present

## 2017-02-12 NOTE — Patient Instructions (Signed)
Your physician recommends that you schedule a follow-up appointment in:  To be determined after Echo.    Your physician has requested that you have an echocardiogram. Echocardiography is a painless test that uses sound waves to create images of your heart. It provides your doctor with information about the size and shape of your heart and how well your heart's chambers and valves are working. This procedure takes approximately one hour. There are no restrictions for this procedure.     Your physician recommends that you continue on your current medications as directed. Please refer to the Current Medication list given to you today.   If you need a refill on your cardiac medications before your next appointment, please call your pharmacy.    No lab work ordered today.       Thank you for choosing [facility] !

## 2017-02-12 NOTE — Progress Notes (Addendum)
Clinical Summary [name] is a 46 y.o. male seen today for follow up of the following medical problems.   1. Chronic systolic HF - new diagnosis during 08/2016 admission with fluid overload - 08/2016 echo LVEF 20-25%. He was diuresed and started on medical therapy. Discharge weight listed at 276 lbs.  - medical therapy initially limited due to some renal dysfunction. Lasix was decreased last visit due to ongoing renal dysfunction. Baseline Cr around 1.27 - soft bp's have also limited medical therapy  - we did not pursue cath at time of diagnosis in absence of signs of acute ischemia, as well as poor renal function. Over time renal function has been improving.      - we have recently increased coreg to 25mg  bid and lisinopril to 5mg  daily.  - compliant with meds. No new side effects - no recent SOB/DOE/LE edema   2. OSA screen - has appt coming up with Dr Michael Ramos next week for evaluation  Past Medical History:  Diagnosis Date  . Allergy   . Autism   . CHF (congestive heart failure) ([place])   . Diabetes mellitus without complication ([place])      No Known Allergies   Current Outpatient Medications  Medication Sig Dispense Refill  . carvedilol (COREG) 25 MG tablet Take 1 tablet (25 mg total) by mouth 2 (two) times daily. 180 tablet 3  . Dextromethorphan-Menthol (DELSYM COUGH RELIEF MT) Use as directed in the mouth or throat.    . furosemide (LASIX) 40 MG tablet Take 40 mg by mouth daily.     Marland Kitchen lisinopril (PRINIVIL,ZESTRIL) 5 MG tablet Take 1 tablet (5 mg total) by mouth daily. 90 tablet 3  . loratadine (CLARITIN) 10 MG tablet Take 1 tablet (10 mg total) by mouth daily. 30 tablet 11  . metFORMIN (GLUCOPHAGE) 500 MG tablet TAKE ONE TABLET BY MOUTH TWICE DAILY 180 tablet 0   No current facility-administered medications for this visit.      No past surgical history on file.   No Known Allergies    Family History  Problem Relation Age of Onset  . Diabetes  Mother   . Cancer Father   . Diabetes Father      Social History [name] reports that  has never smoked. he has never used smokeless tobacco. [name] reports that he does not drink alcohol.   Review of Systems CONSTITUTIONAL: No weight loss, fever, chills, weakness or fatigue.  HEENT: Eyes: No visual loss, blurred vision, double vision or yellow sclerae.No hearing loss, sneezing, congestion, runny nose or sore throat.  SKIN: No rash or itching.  CARDIOVASCULAR: per hpi RESPIRATORY: No shortness of breath, cough or sputum.  GASTROINTESTINAL: No anorexia, nausea, vomiting or diarrhea. No abdominal pain or blood.  GENITOURINARY: No burning on urination, no polyuria NEUROLOGICAL: No headache, dizziness, syncope, paralysis, ataxia, numbness or tingling in the extremities. No change in bowel or bladder control.  MUSCULOSKELETAL: No muscle, back pain, joint pain or stiffness.  LYMPHATICS: No enlarged nodes. No history of splenectomy.  PSYCHIATRIC: No history of depression or anxiety.  ENDOCRINOLOGIC: No reports of sweating, cold or heat intolerance. No polyuria or polydipsia.  Marland Kitchen   Physical Examination Vitals:   02/12/17 1348  BP: 118/72  Pulse: 88  SpO2: 95%   Vitals:   02/12/17 1348  Weight: 284 lb (128.8 kg)  Height: 6\' 3"  (1.905 m)    Gen: resting comfortably, no acute distress HEENT: no scleral icterus, pupils equal round  and reactive, no palptable cervical adenopathy,  CV: RRR, no m/r/g, no jvd Resp: Clear to auscultation bilaterally GI: abdomen is soft, non-tender, non-distended, normal bowel sounds, no hepatosplenomegaly MSK: extremities are warm, no edema.  Skin: warm, no rash Neuro:  no focal deficits Psych: appropriate affect   Diagnostic Studies 08/2016 echo Left ventricle: The cavity size was mildly dilated. Wall thickness was normal. Systolic function was severely reduced. The estimated ejection fraction was in the range of 20% to 25%. Diffuse  hypokinesis. Diastolic function is abnormal, indeterminant grade. - Aortic valve: Valve area (VTI): 1.98 cm^2. Valve area (Vmax): 2.01 cm^2. - Mitral valve: There was mild regurgitation. - Inferior vena cava: The vessel was dilated. The respirophasic diameter changes were blunted (<50%), consistent with elevated central venous pressure. - Pericardium, extracardiac: There is a small circumferential pericardial effusion. There is no evidence of tamponade physiology by echo. Large left pleural effusion. - Technically difficult study.     Assessment and Plan  1. Chronic systolic HF - remains asymptomatic - we will repeat echo. If LVEF remains decreased will need futher medication changes, and would also pursue a heart catheterization. Consider changing ACE-I to entresto at that time.    2. OSA screen - has appt with pulmonary coming up for evaluation  3. Autism - mother has been present for all appointments, both she and patient understand treatment plan.   F/u pending echo results      02/27/17 addendum I have reviewed the risks, indications, and alternatives to cardiac catheterization, possible angioplasty, and stenting with the patient and his mother today. Risks include but are not limited to bleeding, infection, vascular injury, stroke, myocardial infection, arrhythmia, kidney injury, radiation-related injury in the case of prolonged fluoroscopy use, emergency cardiac surgery, and death. The patient understands the risks of serious complication is 1-2 in 3354 with diagnostic cardiac cath and 1-2% or less with angioplasty/stenting.    Michael Ramos, M.D.

## 2017-02-12 NOTE — H&P (View-Only) (Signed)
Clinical Summary [name] is a 46 y.o. male seen today for follow up of the following medical problems.   1. Chronic systolic HF - new diagnosis during 08/2016 admission with fluid overload - 08/2016 echo LVEF 20-25%. He was diuresed and started on medical therapy. Discharge weight listed at 276 lbs.  - medical therapy initially limited due to some renal dysfunction. Lasix was decreased last visit due to ongoing renal dysfunction. Baseline Cr around 1.27 - soft bp's have also limited medical therapy  - we did not pursue cath at time of diagnosis in absence of signs of acute ischemia, as well as poor renal function. Over time renal function has been improving.      - we have recently increased coreg to 25mg  bid and lisinopril to 5mg  daily.  - compliant with meds. No new side effects - no recent SOB/DOE/LE edema   2. OSA screen - has appt coming up with Dr Luan Pulling next week for evaluation  Past Medical History:  Diagnosis Date  . Allergy   . Autism   . CHF (congestive heart failure) ([place])   . Diabetes mellitus without complication ([place])      No Known Allergies   Current Outpatient Medications  Medication Sig Dispense Refill  . carvedilol (COREG) 25 MG tablet Take 1 tablet (25 mg total) by mouth 2 (two) times daily. 180 tablet 3  . Dextromethorphan-Menthol (DELSYM COUGH RELIEF MT) Use as directed in the mouth or throat.    . furosemide (LASIX) 40 MG tablet Take 40 mg by mouth daily.     Marland Kitchen lisinopril (PRINIVIL,ZESTRIL) 5 MG tablet Take 1 tablet (5 mg total) by mouth daily. 90 tablet 3  . loratadine (CLARITIN) 10 MG tablet Take 1 tablet (10 mg total) by mouth daily. 30 tablet 11  . metFORMIN (GLUCOPHAGE) 500 MG tablet TAKE ONE TABLET BY MOUTH TWICE DAILY 180 tablet 0   No current facility-administered medications for this visit.      No past surgical history on file.   No Known Allergies    Family History  Problem Relation Age of Onset  . Diabetes  Mother   . Cancer Father   . Diabetes Father      Social History Mr. Stan reports that  has never smoked. he has never used smokeless tobacco. Mr. Courtwright reports that he does not drink alcohol.   Review of Systems CONSTITUTIONAL: No weight loss, fever, chills, weakness or fatigue.  HEENT: Eyes: No visual loss, blurred vision, double vision or yellow sclerae.No hearing loss, sneezing, congestion, runny nose or sore throat.  SKIN: No rash or itching.  CARDIOVASCULAR: per hpi RESPIRATORY: No shortness of breath, cough or sputum.  GASTROINTESTINAL: No anorexia, nausea, vomiting or diarrhea. No abdominal pain or blood.  GENITOURINARY: No burning on urination, no polyuria NEUROLOGICAL: No headache, dizziness, syncope, paralysis, ataxia, numbness or tingling in the extremities. No change in bowel or bladder control.  MUSCULOSKELETAL: No muscle, back pain, joint pain or stiffness.  LYMPHATICS: No enlarged nodes. No history of splenectomy.  PSYCHIATRIC: No history of depression or anxiety.  ENDOCRINOLOGIC: No reports of sweating, cold or heat intolerance. No polyuria or polydipsia.  Marland Kitchen   Physical Examination Vitals:   02/12/17 1348  BP: 118/72  Pulse: 88  SpO2: 95%   Vitals:   02/12/17 1348  Weight: 284 lb (128.8 kg)  Height: 6\' 3"  (1.905 m)    Gen: resting comfortably, no acute distress HEENT: no scleral icterus, pupils equal round  and reactive, no palptable cervical adenopathy,  CV: RRR, no m/r/g, no jvd Resp: Clear to auscultation bilaterally GI: abdomen is soft, non-tender, non-distended, normal bowel sounds, no hepatosplenomegaly MSK: extremities are warm, no edema.  Skin: warm, no rash Neuro:  no focal deficits Psych: appropriate affect   Diagnostic Studies 08/2016 echo Left ventricle: The cavity size was mildly dilated. Wall thickness was normal. Systolic function was severely reduced. The estimated ejection fraction was in the range of 20% to 25%. Diffuse  hypokinesis. Diastolic function is abnormal, indeterminant grade. - Aortic valve: Valve area (VTI): 1.98 cm^2. Valve area (Vmax): 2.01 cm^2. - Mitral valve: There was mild regurgitation. - Inferior vena cava: The vessel was dilated. The respirophasic diameter changes were blunted (<50%), consistent with elevated central venous pressure. - Pericardium, extracardiac: There is a small circumferential pericardial effusion. There is no evidence of tamponade physiology by echo. Large left pleural effusion. - Technically difficult study.     Assessment and Plan  1. Chronic systolic HF - remains asymptomatic - we will repeat echo. If LVEF remains decreased will need futher medication changes, and would also pursue a heart catheterization. Consider changing ACE-I to entresto at that time.    2. OSA screen - has appt with pulmonary coming up for evaluation  3. Autism - mother has been present for all appointments, both she and patient understand treatment plan.   F/u pending echo results      02/27/17 addendum I have reviewed the risks, indications, and alternatives to cardiac catheterization, possible angioplasty, and stenting with the patient and his mother today. Risks include but are not limited to bleeding, infection, vascular injury, stroke, myocardial infection, arrhythmia, kidney injury, radiation-related injury in the case of prolonged fluoroscopy use, emergency cardiac surgery, and death. The patient understands the risks of serious complication is 1-2 in 6203 with diagnostic cardiac cath and 1-2% or less with angioplasty/stenting.    Arnoldo Lenis, M.D.

## 2017-02-13 ENCOUNTER — Encounter: Payer: Self-pay | Admitting: Cardiology

## 2017-02-14 ENCOUNTER — Other Ambulatory Visit: Payer: Self-pay | Admitting: Physician Assistant

## 2017-02-14 DIAGNOSIS — E119 Type 2 diabetes mellitus without complications: Secondary | ICD-10-CM

## 2017-02-19 ENCOUNTER — Ambulatory Visit (HOSPITAL_COMMUNITY)
Admission: RE | Admit: 2017-02-19 | Discharge: 2017-02-19 | Disposition: A | Discharge status: Home / Self Care | Payer: Medicare Other | Source: Ambulatory Visit | Attending: Cardiology | Admitting: Cardiology

## 2017-02-19 DIAGNOSIS — E119 Type 2 diabetes mellitus without complications: Secondary | ICD-10-CM | POA: Diagnosis not present

## 2017-02-19 DIAGNOSIS — I051 Rheumatic mitral insufficiency: Secondary | ICD-10-CM | POA: Diagnosis not present

## 2017-02-19 DIAGNOSIS — I10 Essential (primary) hypertension: Secondary | ICD-10-CM | POA: Diagnosis not present

## 2017-02-19 DIAGNOSIS — I5022 Chronic systolic (congestive) heart failure: Secondary | ICD-10-CM | POA: Diagnosis not present

## 2017-02-19 DIAGNOSIS — I420 Dilated cardiomyopathy: Secondary | ICD-10-CM | POA: Insufficient documentation

## 2017-02-19 DIAGNOSIS — R0683 Snoring: Secondary | ICD-10-CM | POA: Diagnosis not present

## 2017-02-19 LAB — ECHOCARDIOGRAM COMPLETE
AVLVOTPG: 3 mmHg
E decel time: 201 msec
E/e' ratio: 8.91
FS: 19 % — AB (ref 28–44)
IVS/LV PW RATIO, ED: 0.73
LA diam end sys: 41 mm
LADIAMINDEX: 1.55 cm/m2
LASIZE: 41 mm
LAVOLA4C: 42.1 mL
LV E/e' medial: 8.91
LV PW d: 10.9 mm — AB (ref 0.6–1.1)
LV TDI E'MEDIAL: 5.77
LV e' LATERAL: 8.27 cm/s
LV sys vol: 95 mL — AB
LVDIAVOL: 154 mL — AB (ref 62–150)
LVDIAVOLIN: 58 mL/m2
LVEEAVG: 8.91
LVOT SV: 72 mL
LVOT VTI: 17.4 cm
LVOT area: 4.15 cm2
LVOT peak vel: 87.7 cm/s
LVOTD: 23 mm
LVSYSVOLIN: 36 mL/m2
Lateral S' vel: 13.1 cm/s
MV Dec: 201
MV Peak grad: 2 mmHg
MVPKAVEL: 75.4 m/s
MVPKEVEL: 73.7 m/s
Simpson's disk: 38
Stroke v: 59 ml
TAPSE: 21.9 mm
TDI e' lateral: 8.27

## 2017-02-19 NOTE — Progress Notes (Signed)
*  PRELIMINARY RESULTS* Echocardiogram 2D Echocardiogram has been performed.  Samuel Germany 02/19/2017, 1:42 PM

## 2017-02-22 ENCOUNTER — Other Ambulatory Visit: Payer: Self-pay

## 2017-02-22 DIAGNOSIS — I5022 Chronic systolic (congestive) heart failure: Secondary | ICD-10-CM

## 2017-02-22 DIAGNOSIS — Z01818 Encounter for other preprocedural examination: Secondary | ICD-10-CM

## 2017-02-22 DIAGNOSIS — Z79899 Other long term (current) drug therapy: Secondary | ICD-10-CM

## 2017-02-26 ENCOUNTER — Other Ambulatory Visit: Payer: Self-pay | Admitting: Cardiology

## 2017-02-27 ENCOUNTER — Other Ambulatory Visit (HOSPITAL_COMMUNITY)
Admission: RE | Admit: 2017-02-27 | Discharge: 2017-02-27 | Disposition: A | Discharge status: Home / Self Care | Payer: Medicare Other | Source: Ambulatory Visit | Attending: Cardiology | Admitting: Cardiology

## 2017-02-27 ENCOUNTER — Other Ambulatory Visit: Payer: Self-pay | Admitting: Cardiology

## 2017-02-27 DIAGNOSIS — Z79899 Other long term (current) drug therapy: Secondary | ICD-10-CM | POA: Insufficient documentation

## 2017-02-27 DIAGNOSIS — I5022 Chronic systolic (congestive) heart failure: Secondary | ICD-10-CM | POA: Diagnosis not present

## 2017-02-27 DIAGNOSIS — Z01818 Encounter for other preprocedural examination: Secondary | ICD-10-CM | POA: Insufficient documentation

## 2017-02-27 LAB — CBC WITH DIFFERENTIAL/PLATELET
BASOS ABS: 0 10*3/uL (ref 0.0–0.1)
BASOS PCT: 0 %
EOS PCT: 2 %
Eosinophils Absolute: 0.2 10*3/uL (ref 0.0–0.7)
HCT: 44.5 % (ref 39.0–52.0)
Hemoglobin: 14.1 g/dL (ref 13.0–17.0)
Lymphocytes Relative: 18 %
Lymphs Abs: 1.6 10*3/uL (ref 0.7–4.0)
MCH: 29 pg (ref 26.0–34.0)
MCHC: 31.7 g/dL (ref 30.0–36.0)
MCV: 91.4 fL (ref 78.0–100.0)
MONO ABS: 0.8 10*3/uL (ref 0.1–1.0)
Monocytes Relative: 10 %
Neutro Abs: 5.9 10*3/uL (ref 1.7–7.7)
Neutrophils Relative %: 70 %
PLATELETS: 174 10*3/uL (ref 150–400)
RBC: 4.87 MIL/uL (ref 4.22–5.81)
RDW: 12.9 % (ref 11.5–15.5)
WBC: 8.5 10*3/uL (ref 4.0–10.5)

## 2017-02-27 LAB — BASIC METABOLIC PANEL
Anion gap: 8 (ref 5–15)
BUN: 17 mg/dL (ref 6–20)
CALCIUM: 8.9 mg/dL (ref 8.9–10.3)
CO2: 28 mmol/L (ref 22–32)
CREATININE: 1.35 mg/dL — AB (ref 0.61–1.24)
Chloride: 100 mmol/L — ABNORMAL LOW (ref 101–111)
Glucose, Bld: 160 mg/dL — ABNORMAL HIGH (ref 65–99)
Potassium: 4 mmol/L (ref 3.5–5.1)
SODIUM: 136 mmol/L (ref 135–145)

## 2017-02-27 LAB — PROTIME-INR
INR: 1
Prothrombin Time: 13.1 seconds (ref 11.4–15.2)

## 2017-02-28 ENCOUNTER — Telehealth: Payer: Self-pay | Admitting: Physician Assistant

## 2017-02-28 MED ORDER — CEFDINIR 300 MG PO CAPS: 300.0000 mg | ORAL_CAPSULE | Freq: Two times a day (BID) | ORAL | 0 refills | Status: DC

## 2017-02-28 NOTE — Telephone Encounter (Signed)
What symptoms do you have? Cough, sore throat, runny nose drainage  How long have you been sick? 1 day  Have you been seen for this problem? No, he is having a heart catheterization on Tuesday  If your provider decides to give you a prescription, which pharmacy would you like for it to be sent to? [facility]   Patient informed that this information will be sent to the clinical staff for review and that they should receive a follow up call.

## 2017-03-02 NOTE — Telephone Encounter (Signed)
Pt's wife and pt is taking it and feeling a little better.

## 2017-03-05 ENCOUNTER — Telehealth: Payer: Self-pay

## 2017-03-05 ENCOUNTER — Telehealth: Payer: Self-pay | Admitting: Cardiology

## 2017-03-05 NOTE — Telephone Encounter (Signed)
Patient mother contacted pre-catheterization at [facility] scheduled for:  03/06/2017 @ 1200 Verified arrival time and place:  [facility] @ 1000 Confirmed AM meds to be taken pre-cath with sip of water: Hold Metformin 11/26-48 hours post cath Hold lasix Take ASA Confirmed patient has responsible person to drive home post procedure and observe patient for 24 hours:  yes Addl concerns:  Pt is autistic

## 2017-03-05 NOTE — Telephone Encounter (Signed)
Per phone call from pt's sister-- pt started taking an antibiotic last week, they'd like to make sure it's ok since he's having a cath tomorrow. Also, would like to know when he needs to stop taking his Metformin. Please give sister [name] a call @ [phone]

## 2017-03-05 NOTE — Telephone Encounter (Signed)
I reaffirmed metformin instructions to sister, HOLD TODAY, Tomorrow, and for 2 days after cath.He has congestion and was put on antibiotic.No fever  I also reaffirmed that cath is at [facility] and not at [facility]

## 2017-03-06 ENCOUNTER — Ambulatory Visit (HOSPITAL_COMMUNITY)
Admission: RE | Admit: 2017-03-06 | Discharge: 2017-03-06 | Disposition: A | Discharge status: Home / Self Care | Payer: Medicare Other | Source: Ambulatory Visit | Attending: Cardiology | Admitting: Cardiology

## 2017-03-06 ENCOUNTER — Encounter (HOSPITAL_COMMUNITY)
Admission: RE | Disposition: A | Discharge status: Home / Self Care | Payer: Self-pay | Source: Ambulatory Visit | Attending: Cardiology

## 2017-03-06 DIAGNOSIS — E119 Type 2 diabetes mellitus without complications: Secondary | ICD-10-CM | POA: Diagnosis not present

## 2017-03-06 DIAGNOSIS — I428 Other cardiomyopathies: Secondary | ICD-10-CM | POA: Diagnosis not present

## 2017-03-06 DIAGNOSIS — F840 Autistic disorder: Secondary | ICD-10-CM | POA: Diagnosis not present

## 2017-03-06 DIAGNOSIS — I5022 Chronic systolic (congestive) heart failure: Secondary | ICD-10-CM

## 2017-03-06 DIAGNOSIS — Z7984 Long term (current) use of oral hypoglycemic drugs: Secondary | ICD-10-CM | POA: Insufficient documentation

## 2017-03-06 HISTORY — PX: RIGHT/LEFT HEART CATH AND CORONARY ANGIOGRAPHY: CATH118266

## 2017-03-06 LAB — POCT I-STAT 3, ART BLOOD GAS (G3+)
Acid-Base Excess: 1 mmol/L (ref 0.0–2.0)
Bicarbonate: 26.4 mmol/L (ref 20.0–28.0)
O2 SAT: 99 %
PCO2 ART: 45.1 mmHg (ref 32.0–48.0)
PH ART: 7.375 (ref 7.350–7.450)
PO2 ART: 124 mmHg — AB (ref 83.0–108.0)
TCO2: 28 mmol/L (ref 22–32)

## 2017-03-06 LAB — POCT I-STAT 3, VENOUS BLOOD GAS (G3P V)
ACID-BASE EXCESS: 2 mmol/L (ref 0.0–2.0)
BICARBONATE: 27.5 mmol/L (ref 20.0–28.0)
O2 Saturation: 67 %
PCO2 VEN: 45.4 mmHg (ref 44.0–60.0)
PH VEN: 7.389 (ref 7.250–7.430)
PO2 VEN: 36 mmHg (ref 32.0–45.0)
TCO2: 29 mmol/L (ref 22–32)

## 2017-03-06 LAB — GLUCOSE, CAPILLARY: GLUCOSE-CAPILLARY: 160 mg/dL — AB (ref 65–99)

## 2017-03-06 SURGERY — RIGHT/LEFT HEART CATH AND CORONARY ANGIOGRAPHY
Anesthesia: LOCAL

## 2017-03-06 MED ORDER — SODIUM CHLORIDE 0.9 % IV SOLN: 250.0000 mL | INTRAVENOUS | Status: DC | PRN

## 2017-03-06 MED ORDER — HEPARIN SODIUM (PORCINE) 1000 UNIT/ML IJ SOLN
INTRAMUSCULAR | Status: AC
  Filled 2017-03-06: qty 1

## 2017-03-06 MED ORDER — IOPAMIDOL (ISOVUE-370) INJECTION 76%
INTRAVENOUS | Status: AC
  Filled 2017-03-06: qty 100

## 2017-03-06 MED ORDER — MIDAZOLAM HCL 2 MG/2ML IJ SOLN
INTRAMUSCULAR | Status: AC
  Filled 2017-03-06: qty 2

## 2017-03-06 MED ORDER — FENTANYL CITRATE (PF) 100 MCG/2ML IJ SOLN
INTRAMUSCULAR | Status: AC
  Filled 2017-03-06: qty 2

## 2017-03-06 MED ORDER — SODIUM CHLORIDE 0.9% FLUSH: 3.0000 mL | Freq: Two times a day (BID) | INTRAVENOUS | Status: DC

## 2017-03-06 MED ORDER — SODIUM CHLORIDE 0.9% FLUSH: 3.0000 mL | INTRAVENOUS | Status: DC | PRN

## 2017-03-06 MED ORDER — SODIUM CHLORIDE 0.9 % IV SOLN: INTRAVENOUS | Status: AC

## 2017-03-06 MED ORDER — VERAPAMIL HCL 2.5 MG/ML IV SOLN
INTRAVENOUS | Status: DC | PRN
  Administered 2017-03-06: 10 mL via INTRA_ARTERIAL

## 2017-03-06 MED ORDER — IOPAMIDOL (ISOVUE-370) INJECTION 76%
INTRAVENOUS | Status: DC | PRN
  Administered 2017-03-06: 60 mL via INTRA_ARTERIAL

## 2017-03-06 MED ORDER — SODIUM CHLORIDE 0.9 % IV SOLN
INTRAVENOUS | Status: DC
  Administered 2017-03-06: 11:00:00 via INTRAVENOUS

## 2017-03-06 MED ORDER — MIDAZOLAM HCL 2 MG/2ML IJ SOLN
INTRAMUSCULAR | Status: DC | PRN
  Administered 2017-03-06 (×2): 1 mg via INTRAVENOUS

## 2017-03-06 MED ORDER — LIDOCAINE HCL (PF) 1 % IJ SOLN
INTRAMUSCULAR | Status: DC | PRN
  Administered 2017-03-06: 5 mL

## 2017-03-06 MED ORDER — HEPARIN (PORCINE) IN NACL 2-0.9 UNIT/ML-% IJ SOLN
INTRAMUSCULAR | Status: AC | PRN
  Administered 2017-03-06: 1000 mL

## 2017-03-06 MED ORDER — HEPARIN SODIUM (PORCINE) 1000 UNIT/ML IJ SOLN
INTRAMUSCULAR | Status: DC | PRN
  Administered 2017-03-06: 6000 [IU] via INTRAVENOUS

## 2017-03-06 MED ORDER — FENTANYL CITRATE (PF) 100 MCG/2ML IJ SOLN
INTRAMUSCULAR | Status: DC | PRN
  Administered 2017-03-06 (×2): 25 ug via INTRAVENOUS

## 2017-03-06 MED ORDER — HEPARIN (PORCINE) IN NACL 2-0.9 UNIT/ML-% IJ SOLN
INTRAMUSCULAR | Status: AC
  Filled 2017-03-06: qty 1000

## 2017-03-06 MED ORDER — LIDOCAINE HCL (PF) 1 % IJ SOLN
INTRAMUSCULAR | Status: AC
  Filled 2017-03-06: qty 30

## 2017-03-06 MED ORDER — VERAPAMIL HCL 2.5 MG/ML IV SOLN
INTRAVENOUS | Status: AC
  Filled 2017-03-06: qty 2

## 2017-03-06 MED ORDER — ASPIRIN 81 MG PO CHEW: 81.0000 mg | CHEWABLE_TABLET | ORAL | Status: DC

## 2017-03-06 SURGICAL SUPPLY — 12 items
CATH BALLN WEDGE 5F 110CM (CATHETERS) ×2 IMPLANT
CATH IMPULSE 5F ANG/FL3.5 (CATHETERS) ×2 IMPLANT
DEVICE RAD COMP TR BAND LRG (VASCULAR PRODUCTS) ×2 IMPLANT
GLIDESHEATH SLEND SS 6F .021 (SHEATH) ×2 IMPLANT
GUIDEWIRE .025 260CM (WIRE) ×2 IMPLANT
GUIDEWIRE INQWIRE 1.5J.035X260 (WIRE) ×1 IMPLANT
INQWIRE 1.5J .035X260CM (WIRE) ×2
KIT HEART LEFT (KITS) ×2 IMPLANT
PACK CARDIAC CATHETERIZATION (CUSTOM PROCEDURE TRAY) ×2 IMPLANT
SHEATH GLIDE SLENDER 4/5FR (SHEATH) ×2 IMPLANT
TRANSDUCER W/STOPCOCK (MISCELLANEOUS) ×2 IMPLANT
TUBING CIL FLEX 10 FLL-RA (TUBING) ×2 IMPLANT

## 2017-03-06 NOTE — Discharge Instructions (Signed)
Radial Site Care Refer to this sheet in the next few weeks. These instructions provide you with information about caring for yourself after your procedure. Your health care provider may also give you more specific instructions. Your treatment has been planned according to current medical practices, but problems sometimes occur. Call your health care provider if you have any problems or questions after your procedure. What can I expect after the procedure? After your procedure, it is typical to have the following:  Bruising at the radial site that usually fades within 1-2 weeks.  Blood collecting in the tissue (hematoma) that may be painful to the touch. It should usually decrease in size and tenderness within 1-2 weeks.  Follow these instructions at home:  Take medicines only as directed by your health care provider.  You may shower 24-48 hours after the procedure or as directed by your health care provider. Remove the bandage (dressing) and gently wash the site with plain soap and water. Pat the area dry with a clean towel. Do not rub the site, because this may cause bleeding.  Do not take baths, swim, or use a hot tub until your health care provider approves.  Check your insertion site every day for redness, swelling, or drainage.  Do not apply powder or lotion to the site.  Do not flex or bend the affected arm for 24 hours or as directed by your health care provider.  Do not push or pull heavy objects with the affected arm for 24 hours or as directed by your health care provider.  Do not lift over 10 lb (4.5 kg) for 5 days after your procedure or as directed by your health care provider.  Ask your health care provider when it is okay to: ? Return to work or school. ? Resume usual physical activities or sports. ? Resume sexual activity.  Do not drive home if you are discharged the same day as the procedure. Have someone else drive you.  You may drive 24 hours after the procedure  unless otherwise instructed by your health care provider.  Do not operate machinery or power tools for 24 hours after the procedure.  If your procedure was done as an outpatient procedure, which means that you went home the same day as your procedure, a responsible adult should be with you for the first 24 hours after you arrive home.  Keep all follow-up visits as directed by your health care provider. This is important. Contact a health care provider if:  You have a fever.  You have chills.  You have increased bleeding from the radial site. Hold pressure on the site. Get help right away if:  You have unusual pain at the radial site.  You have redness, warmth, or swelling at the radial site.  You have drainage (other than a small amount of blood on the dressing) from the radial site.  The radial site is bleeding, and the bleeding does not stop after 30 minutes of holding steady pressure on the site.  Your arm or hand becomes pale, cool, tingly, or numb. This information is not intended to replace advice given to you by your health care provider. Make sure you discuss any questions you have with your health care provider. Document Released: 04/29/2010 Document Revised: 09/02/2015 Document Reviewed: 10/13/2013 Elsevier Interactive Patient Education  2018 [place] Metformin for 2 days (48 hours) post cath.

## 2017-03-06 NOTE — Interval H&P Note (Signed)
History and Physical Interval Note:  03/06/2017 1:13 PM  Michael Ramos  has presented today for cardiac cath with the diagnosis of cardiomyopathy, CHF. The various methods of treatment have been discussed with the patient and family. After consideration of risks, benefits and other options for treatment, the patient has consented to  Procedure(s): RIGHT/LEFT HEART CATH AND CORONARY ANGIOGRAPHY (N/A) as a surgical intervention .  The patient's history has been reviewed, patient examined, no change in status, stable for surgery.  I have reviewed the patient's chart and labs.  Questions were answered to the patient's satisfaction.    Cath Lab Visit (complete for each Cath Lab visit)  Clinical Evaluation Leading to the Procedure:   ACS: No.  Non-ACS:    Anginal Classification: CCS I  Anti-ischemic medical therapy: Minimal Therapy (1 class of medications)  Non-Invasive Test Results: No non-invasive testing performed  Prior CABG: No previous CABG      Lauree Chandler

## 2017-03-07 ENCOUNTER — Encounter (HOSPITAL_COMMUNITY): Payer: Self-pay | Admitting: Cardiovascular Disease

## 2017-04-09 ENCOUNTER — Other Ambulatory Visit: Payer: Self-pay | Admitting: Physician Assistant

## 2017-04-09 DIAGNOSIS — J3089 Other allergic rhinitis: Secondary | ICD-10-CM

## 2017-04-25 NOTE — Progress Notes (Signed)
Cardiology Office Note    Date:  04/26/2017   ID:  Michael Ramos, DOB 1971-03-20, MRN [id number]  PCP:  Michael Sleeper, PA-C  Cardiologist: Dr. Harl Ramos   Chief Complaint  Patient presents with  . Follow-up    s/p cardiac catheterization    History of Present Illness:    Michael Ramos is a 47 y.o. male with past medical history of chronic systolic CHF (EF 47-09% by echo in 08/2016, repeat echo in 02/2017 showing EF 30-35%), HTN, Type 2 DM, and Autism who presents to the office today for follow-up of his recent cardiac catheterization.   He was last examined by Dr. Harl Ramos in 02/2017 and denied any recent chest pain or dyspnea on exertion at that time. A repeat echocardiogram was recommended with plans for a cardiac catheterization if EF remained reduced (cath not performed at the time of his initial diagnosis secondary to renal insufficiency and his asymptomatic state). Repeat echo showed a persistent reduced EF of 30-35%, therefore a catheterization was recommended. He presented to [facility]2017 for the procedure and this showed no angiographic evidence of CAD, consistent with nonischemic cardiomyopathy.   In talking with the patient and his mother today, he reports doing well since his recent cardiac catheterization. No complications regarding his radial cath site. He denies any recent chest pain, dyspnea on exertion, orthopnea, PND, or lower extremity edema. Weight has been stable between 280 - 282 lbs on his home scales.   They do not check his BP regularly but it is well-controlled at 112/70 during today's visit.    Past Medical History:  Diagnosis Date  . Allergy   . Autism   . CHF (congestive heart failure) ([place])    a. EF 20-25% by echo in 08/2016 b. repeat echo in 02/2017 showing EF 30-35% with cath showing normal cors.   . Diabetes mellitus without complication [facility])     Past Surgical History:  Procedure Laterality Date  . RIGHT/LEFT HEART CATH AND  CORONARY ANGIOGRAPHY N/A 03/06/2017   Procedure: RIGHT/LEFT HEART CATH AND CORONARY ANGIOGRAPHY;  Surgeon: Michael Blanks, MD;  Location: [facility] [facility];  Service: Cardiovascular;  Laterality: N/A;    Current Medications: Outpatient Medications Prior to Visit  Medication Sig Dispense Refill  . carvedilol (COREG) 25 MG tablet Take 1 tablet (25 mg total) by mouth 2 (two) times daily. 180 tablet 3  . furosemide (LASIX) 40 MG tablet Take 40 mg by mouth daily.     Marland Kitchen lisinopril (PRINIVIL,ZESTRIL) 5 MG tablet Take 1 tablet (5 mg total) by mouth daily. 90 tablet 3  . loratadine (CLARITIN) 10 MG tablet TAKE ONE (1) TABLET EACH DAY 90 tablet 0  . metFORMIN (GLUCOPHAGE) 500 MG tablet TAKE ONE TABLET BY MOUTH TWICE DAILY 180 tablet 0  . cefdinir (OMNICEF) 300 MG capsule Take 1 capsule (300 mg total) by mouth 2 (two) times daily. 1 po BID 20 capsule 0   No facility-administered medications prior to visit.      Allergies:   Patient has no known allergies.   Social History   Socioeconomic History  . Marital status: Single    Spouse name: None  . Number of children: None  . Years of education: None  . Highest education level: None  Social Needs  . Financial resource strain: None  . Food insecurity - worry: None  . Food insecurity - inability: None  . Transportation needs - medical: None  . Transportation needs -  non-medical: None  Occupational History  . None  Tobacco Use  . Smoking status: Never Smoker  . Smokeless tobacco: Never Used  Substance and Sexual Activity  . Alcohol use: No  . Drug use: No  . Sexual activity: Not Currently  Other Topics Concern  . None  Social History Narrative  . None     Family History:  The patient's family history includes Cancer in his father; Diabetes in his father and mother.   Review of Systems:   Please see the history of present illness.     General:  No chills, fever, night sweats or weight changes.  Cardiovascular:  No chest  pain, dyspnea on exertion, edema, orthopnea, palpitations, paroxysmal nocturnal dyspnea. Dermatological: No rash, lesions/masses Respiratory: No cough, dyspnea Urologic: No hematuria, dysuria Abdominal:   No nausea, vomiting, diarrhea, bright red blood per rectum, melena, or hematemesis Neurologic:  No visual changes, wkns, changes in mental status.  He denies any of the above symptoms. Patient's mother is unaware of any new symptoms as well.   All other systems reviewed and are otherwise negative except as noted above.   Physical Exam:    VS:  BP 112/70 (BP Location: Right Arm)   Pulse 93   Ht 6\' 3"  (1.905 m)   Wt 280 lb (127 kg)   SpO2 95%   BMI 35.00 kg/m    General: Well developed, well nourished Caucasian male appearing in no acute distress. Head: Normocephalic, atraumatic, sclera non-icteric, no xanthomas, nares are without discharge.  Neck: No carotid bruits. JVD not elevated.  Lungs: Respirations regular and unlabored, without wheezes or rales.  Heart: Regular rate and rhythm. No S3 or S4.  No murmur, no rubs, or gallops appreciated. Abdomen: Soft, non-tender, non-distended with normoactive bowel sounds. No hepatomegaly. No rebound/guarding. No obvious abdominal masses. Msk:  Strength and tone appear normal for age. No joint deformities or effusions. Extremities: No clubbing or cyanosis. No edema.  Distal pedal pulses are 2+ bilaterally. Radial site stable with no ecchymosis or evidence of a hematoma.  Neuro: Alert and oriented X 3. Moves all extremities spontaneously. No focal deficits noted. Psych:  Responds to questions appropriately with a normal affect. Skin: No rashes or lesions noted  Wt Readings from Last 3 Encounters:  04/26/17 280 lb (127 kg)  03/06/17 282 lb (127.9 kg)  02/12/17 284 lb (128.8 kg)     Studies/Labs Reviewed:   EKG:  EKG is not ordered today.    Recent Labs: 08/16/2016: B Natriuretic Peptide 204.0; TSH 1.671 08/25/2016: ALT 28 10/05/2016:  Magnesium 2.0 02/27/2017: BUN 17; Creatinine, Ser 1.35; Hemoglobin 14.1; Platelets 174; Potassium 4.0; Sodium 136   Lipid Panel    Component Value Date/Time   CHOL 126 03/14/2016 1130   TRIG 187 (H) 03/14/2016 1130   HDL 47 03/14/2016 1130   CHOLHDL 2.7 03/14/2016 1130   LDLCALC 42 03/14/2016 1130    Additional studies/ records that were reviewed today include:   Echocardiogram: 02/2017 Study Conclusions  - Left ventricle: The cavity size was mildly dilated. Wall   thickness was normal. Systolic function was moderately to   severely reduced. The estimated ejection fraction was in the   range of 30% to 35%. Diffuse hypokinesis. Diastolic dysfunction,   grade indeterminate. Indeterminate filling pressures. - Mitral valve: There was mild regurgitation.  Cardiac Catheterization: 03/06/2017  Hemodynamic findings consistent with mild pulmonary hypertension.   1. No angiographic evidence of CAD 2. Non-ischemic cardiomyopathy  Recommendations: medical management of cardiomyopathy  and CHF.   Assessment:    1. Chronic systolic heart failure ([place])   2. Nonischemic cardiomyopathy ([place])   3. Essential hypertension   4. Type 2 diabetes mellitus without complication, without long-term current use of insulin ([place])   5. Autism      Plan:   In order of problems listed above:  1. Chronic Systolic CHF/ Nonischemic Cardiomyopathy - EF reduced to 20-25% by echo in 08/2016, repeat echo in 02/2017 showing EF 30-35%. Underwent a cardiac catheterization in 02/2017 which showed no angiographic evidence of CAD, consistent with nonischemic cardiomyopathy.  - he denies any recent dyspnea on exertion, orthopnea, PND, or lower extremity edema. Weight has been stable on his home scales and he appears euvolemic by examination today.  - continue Lasix at current dosing along with Lisinopril 5mg  daily and Coreg 25mg  BID. Would not switch to Entresto at this time given his soft BP but could consider  in the future if BP allows.    2. HTN - BP is well-controlled at 112/70 during today's visit. - continue Lasix 40mg  daily, Lisinopril 5mg  daily, and Coreg 25mg  BID.   3. Type 2 DM - followed by PCP. Most recent Hgb A1c in 08/2016 was stable at 6.5. - remains on Metformin.   4. Autism - he is accompanied by his mother today who contributes to his history.    Medication Adjustments/Labs and Tests Ordered: Current medicines are reviewed at length with the patient today.  Concerns regarding medicines are outlined above.  Medication changes, Labs and Tests ordered today are listed in the Patient Instructions below. Patient Instructions  Medication Instructions:  Your physician recommends that you continue on your current medications as directed. Please refer to the Current Medication list given to you today.  Labwork: NONE   Testing/Procedures: NONE   Follow-Up: Your physician recommends that you schedule a follow-up appointment in: 3-4 Months with Dr. Harl Ramos   Any Other Special Instructions Will Be Listed Below (If Applicable).  May take an extra Lasix for weight gain of 3 lbs over night or 5 lbs in 1 one week.   If you need a refill on your cardiac medications before your next appointment, please call your pharmacy.  Thank you for choosing [facility]!    Signed, Erma Heritage, PA-C  04/26/2017 5:29 PM    [facility] [address] [place], [place] [address] Phone: [phone]

## 2017-04-26 ENCOUNTER — Ambulatory Visit (INDEPENDENT_AMBULATORY_CARE_PROVIDER_SITE_OTHER): Payer: Medicare Other | Admitting: Student

## 2017-04-26 ENCOUNTER — Encounter: Payer: Self-pay | Admitting: Student

## 2017-04-26 VITALS — BP 112/70 | HR 93 | Ht 75.0 in | Wt 280.0 lb

## 2017-04-26 DIAGNOSIS — E119 Type 2 diabetes mellitus without complications: Secondary | ICD-10-CM | POA: Diagnosis not present

## 2017-04-26 DIAGNOSIS — I428 Other cardiomyopathies: Secondary | ICD-10-CM | POA: Diagnosis not present

## 2017-04-26 DIAGNOSIS — I10 Essential (primary) hypertension: Secondary | ICD-10-CM | POA: Diagnosis not present

## 2017-04-26 DIAGNOSIS — I5022 Chronic systolic (congestive) heart failure: Secondary | ICD-10-CM | POA: Diagnosis not present

## 2017-04-26 DIAGNOSIS — F840 Autistic disorder: Secondary | ICD-10-CM | POA: Diagnosis not present

## 2017-04-26 NOTE — Patient Instructions (Addendum)
Medication Instructions:  Your physician recommends that you continue on your current medications as directed. Please refer to the Current Medication list given to you today.   Labwork: NONE   Testing/Procedures: NONE   Follow-Up: Your physician recommends that you schedule a follow-up appointment in: 3-4 Months with Dr. Harl Bowie    Any Other Special Instructions Will Be Listed Below (If Applicable).  May take an extra Lasix for weight gain of 3 lbs over night or 5 lbs in 1 one week.   If you need a refill on your cardiac medications before your next appointment, please call your pharmacy.  Thank you for choosing [facility]!

## 2017-05-30 ENCOUNTER — Other Ambulatory Visit: Payer: Self-pay | Admitting: Physician Assistant

## 2017-05-30 DIAGNOSIS — E119 Type 2 diabetes mellitus without complications: Secondary | ICD-10-CM

## 2017-07-02 ENCOUNTER — Other Ambulatory Visit: Payer: Self-pay | Admitting: Cardiology

## 2017-07-02 ENCOUNTER — Other Ambulatory Visit: Payer: Self-pay | Admitting: Physician Assistant

## 2017-07-02 DIAGNOSIS — J3089 Other allergic rhinitis: Secondary | ICD-10-CM

## 2017-07-05 ENCOUNTER — Ambulatory Visit (INDEPENDENT_AMBULATORY_CARE_PROVIDER_SITE_OTHER): Payer: Medicare Other | Admitting: Physician Assistant

## 2017-07-05 ENCOUNTER — Encounter: Payer: Self-pay | Admitting: Physician Assistant

## 2017-07-05 VITALS — BP 119/78 | HR 78 | Temp 97.1°F | Ht 75.0 in | Wt 288.0 lb

## 2017-07-05 DIAGNOSIS — I5021 Acute systolic (congestive) heart failure: Secondary | ICD-10-CM

## 2017-07-05 DIAGNOSIS — E119 Type 2 diabetes mellitus without complications: Secondary | ICD-10-CM | POA: Diagnosis not present

## 2017-07-05 LAB — BAYER DCA HB A1C WAIVED: HB A1C: 6.8 % (ref <7.0)

## 2017-07-05 MED ORDER — GLIPIZIDE 5 MG PO TABS: 5.0000 mg | ORAL_TABLET | Freq: Two times a day (BID) | ORAL | 3 refills | Status: DC

## 2017-07-05 MED ORDER — METFORMIN HCL 500 MG PO TABS: 500.0000 mg | ORAL_TABLET | Freq: Every day | ORAL | 3 refills | Status: DC

## 2017-07-05 NOTE — Patient Instructions (Signed)
In a few days you may receive a survey in the mail or online from [employer] regarding your visit with us today. Please take a moment to fill this out. Your feedback is very important to our whole office. It can help us better understand your needs as well as improve your experience and satisfaction. Thank you for taking your time to complete it. We care about you.  Kameren Baade, PA-C  

## 2017-07-06 LAB — CMP14+EGFR
A/G RATIO: 1.5 (ref 1.2–2.2)
ALT: 17 IU/L (ref 0–44)
AST: 17 IU/L (ref 0–40)
Albumin: 4.2 g/dL (ref 3.5–5.5)
Alkaline Phosphatase: 31 IU/L — ABNORMAL LOW (ref 39–117)
BUN/Creatinine Ratio: 13 (ref 9–20)
BUN: 18 mg/dL (ref 6–24)
Bilirubin Total: 0.4 mg/dL (ref 0.0–1.2)
CALCIUM: 9.2 mg/dL (ref 8.7–10.2)
CHLORIDE: 103 mmol/L (ref 96–106)
CO2: 18 mmol/L — ABNORMAL LOW (ref 20–29)
Creatinine, Ser: 1.37 mg/dL — ABNORMAL HIGH (ref 0.76–1.27)
GFR calc Af Amer: 71 mL/min/{1.73_m2} (ref >59)
GFR, EST NON AFRICAN AMERICAN: 62 mL/min/{1.73_m2} (ref >59)
Globulin, Total: 2.8 g/dL (ref 1.5–4.5)
Glucose: 169 mg/dL — ABNORMAL HIGH (ref 65–99)
POTASSIUM: 4.1 mmol/L (ref 3.5–5.2)
Sodium: 143 mmol/L (ref 134–144)
Total Protein: 7 g/dL (ref 6.0–8.5)

## 2017-07-06 LAB — LIPID PANEL
Chol/HDL Ratio: 3.1 ratio (ref 0.0–5.0)
Cholesterol, Total: 138 mg/dL (ref 100–199)
HDL: 45 mg/dL (ref >39)
LDL Calculated: 42 mg/dL (ref 0–99)
TRIGLYCERIDES: 257 mg/dL — AB (ref 0–149)
VLDL Cholesterol Cal: 51 mg/dL — ABNORMAL HIGH (ref 5–40)

## 2017-07-06 LAB — CBC WITH DIFFERENTIAL/PLATELET
BASOS ABS: 0.1 10*3/uL (ref 0.0–0.2)
BASOS: 1 %
EOS (ABSOLUTE): 0.2 10*3/uL (ref 0.0–0.4)
Eos: 3 %
Hematocrit: 42 % (ref 37.5–51.0)
Hemoglobin: 14.3 g/dL (ref 13.0–17.7)
IMMATURE GRANS (ABS): 0 10*3/uL (ref 0.0–0.1)
IMMATURE GRANULOCYTES: 0 %
LYMPHS: 18 %
Lymphocytes Absolute: 1.5 10*3/uL (ref 0.7–3.1)
MCH: 29.7 pg (ref 26.6–33.0)
MCHC: 34 g/dL (ref 31.5–35.7)
MCV: 87 fL (ref 79–97)
Monocytes Absolute: 0.7 10*3/uL (ref 0.1–0.9)
Monocytes: 8 %
NEUTROS PCT: 70 %
Neutrophils Absolute: 6.2 10*3/uL (ref 1.4–7.0)
PLATELETS: 227 10*3/uL (ref 150–379)
RBC: 4.82 x10E6/uL (ref 4.14–5.80)
RDW: 13.4 % (ref 12.3–15.4)
WBC: 8.7 10*3/uL (ref 3.4–10.8)

## 2017-07-08 NOTE — Progress Notes (Signed)
BP 119/78   Pulse 78   Temp (!) 97.1 F (36.2 C) (Oral)   Ht '6\' 3"'  (1.905 m)   Wt 288 lb (130.6 kg)   BMI 36.00 kg/m    Subjective:    Patient ID: Michael Ramos, male    DOB: 1971-02-05, 47 y.o.   MRN: [id number]  HPI: Michael Ramos is a 47 y.o. male presenting on 07/05/2017 for Diarrhea  This patient comes in for periodic recheck on medications and conditions including type 2 diabetes well controlled, chronic heart failure and hypertension.  He is doing fairly well overall he has seen his cardiologist.  He has slips up with his eating.  He has been on Tridil work better and reducing carbs and soda again.  We will recheck him in 3 months..   All medications are reviewed today. There are no reports of any problems with the medications. All of the medical conditions are reviewed and updated.  Lab work is reviewed and will be ordered as medically necessary. There are no new problems reported with today's visit.   Past Medical History:  Diagnosis Date  . Allergy   . Autism   . CHF (congestive heart failure) ([place])    a. EF 20-25% by echo in 08/2016 b. repeat echo in 02/2017 showing EF 30-35% with cath showing normal cors.   . Diabetes mellitus without complication ([place])    Relevant past medical, surgical, family and social history reviewed and updated as indicated. Interim medical history since our last visit reviewed. Allergies and medications reviewed and updated. DATA REVIEWED: CHART IN EPIC  Family History reviewed for pertinent findings.  Review of Systems  Constitutional: Negative.  Negative for appetite change and fatigue.  HENT: Negative.   Eyes: Negative.  Negative for pain and visual disturbance.  Respiratory: Negative.  Negative for cough, chest tightness, shortness of breath and wheezing.   Cardiovascular: Negative.  Negative for chest pain, palpitations and leg swelling.  Gastrointestinal: Negative.  Negative for abdominal pain, diarrhea, nausea and vomiting.    Endocrine: Negative.   Genitourinary: Negative.   Musculoskeletal: Negative.   Skin: Negative.  Negative for color change and rash.  Neurological: Negative.  Negative for weakness, numbness and headaches.  Psychiatric/Behavioral: Negative.     Allergies as of 07/05/2017   No Known Allergies     Medication List        Accurate as of 07/05/17 11:59 PM. Always use your most recent med list.          carvedilol 25 MG tablet Commonly known as:  COREG Take 1 tablet (25 mg total) by mouth 2 (two) times daily.   furosemide 40 MG tablet Commonly known as:  LASIX Take 40 mg by mouth daily.   furosemide 40 MG tablet Commonly known as:  LASIX TAKE ONE (1) TABLET EACH DAY   glipiZIDE 5 MG tablet Commonly known as:  GLUCOTROL Take 1 tablet (5 mg total) by mouth 2 (two) times daily before a meal.   lisinopril 5 MG tablet Commonly known as:  PRINIVIL,ZESTRIL Take 1 tablet (5 mg total) by mouth daily.   loratadine 10 MG tablet Commonly known as:  CLARITIN TAKE ONE (1) TABLET EACH DAY   metFORMIN 500 MG tablet Commonly known as:  GLUCOPHAGE Take 1 tablet (500 mg total) by mouth daily.          Objective:    BP 119/78   Pulse 78   Temp (!) 97.1 F (36.2 C) (Oral)  Ht '6\' 3"'  (1.905 m)   Wt 288 lb (130.6 kg)   BMI 36.00 kg/m   No Known Allergies  Wt Readings from Last 3 Encounters:  07/05/17 288 lb (130.6 kg)  04/26/17 280 lb (127 kg)  03/06/17 282 lb (127.9 kg)    Physical Exam  Constitutional: He appears well-developed and well-nourished. No distress.  HENT:  Head: Normocephalic and atraumatic.  Eyes: Pupils are equal, round, and reactive to light. Conjunctivae and EOM are normal.  Cardiovascular: Normal rate, regular rhythm and normal heart sounds.  Pulmonary/Chest: Effort normal and breath sounds normal. No respiratory distress.  Skin: Skin is warm and dry.  Psychiatric: He has a normal mood and affect. His behavior is normal.  Nursing note and vitals  reviewed.   Results for orders placed or performed in visit on 07/05/17  CMP14+EGFR  Result Value Ref Range   Glucose 169 (H) 65 - 99 mg/dL   BUN 18 6 - 24 mg/dL   Creatinine, Ser 1.37 (H) 0.76 - 1.27 mg/dL   GFR calc non Af Amer 62 >59 mL/min/1.73   GFR calc Af Amer 71 >59 mL/min/1.73   BUN/Creatinine Ratio 13 9 - 20   Sodium 143 134 - 144 mmol/L   Potassium 4.1 3.5 - 5.2 mmol/L   Chloride 103 96 - 106 mmol/L   CO2 18 (L) 20 - 29 mmol/L   Calcium 9.2 8.7 - 10.2 mg/dL   Total Protein 7.0 6.0 - 8.5 g/dL   Albumin 4.2 3.5 - 5.5 g/dL   Globulin, Total 2.8 1.5 - 4.5 g/dL   Albumin/Globulin Ratio 1.5 1.2 - 2.2   Bilirubin Total 0.4 0.0 - 1.2 mg/dL   Alkaline Phosphatase 31 (L) 39 - 117 IU/L   AST 17 0 - 40 IU/L   ALT 17 0 - 44 IU/L  CBC with Differential/Platelet  Result Value Ref Range   WBC 8.7 3.4 - 10.8 x10E3/uL   RBC 4.82 4.14 - 5.80 x10E6/uL   Hemoglobin 14.3 13.0 - 17.7 g/dL   Hematocrit 42.0 37.5 - 51.0 %   MCV 87 79 - 97 fL   MCH 29.7 26.6 - 33.0 pg   MCHC 34.0 31.5 - 35.7 g/dL   RDW 13.4 12.3 - 15.4 %   Platelets 227 150 - 379 x10E3/uL   Neutrophils 70 Not Estab. %   Lymphs 18 Not Estab. %   Monocytes 8 Not Estab. %   Eos 3 Not Estab. %   Basos 1 Not Estab. %   Neutrophils Absolute 6.2 1.4 - 7.0 x10E3/uL   Lymphocytes Absolute 1.5 0.7 - 3.1 x10E3/uL   Monocytes Absolute 0.7 0.1 - 0.9 x10E3/uL   EOS (ABSOLUTE) 0.2 0.0 - 0.4 x10E3/uL   Basophils Absolute 0.1 0.0 - 0.2 x10E3/uL   Immature Granulocytes 0 Not Estab. %   Immature Grans (Abs) 0.0 0.0 - 0.1 x10E3/uL  Bayer DCA Hb A1c Waived  Result Value Ref Range   Bayer DCA Hb A1c Waived 6.8 <7.0 %  Lipid panel  Result Value Ref Range   Cholesterol, Total 138 100 - 199 mg/dL   Triglycerides 257 (H) 0 - 149 mg/dL   HDL 45 >39 mg/dL   VLDL Cholesterol Cal 51 (H) 5 - 40 mg/dL   LDL Calculated 42 0 - 99 mg/dL   Chol/HDL Ratio 3.1 0.0 - 5.0 ratio      Assessment & Plan:   1. Type 2 diabetes mellitus without  complication, without long-term current use of insulin (HCC) -  CMP14+EGFR - CBC with Differential/Platelet - Bayer DCA Hb A1c Waived - Lipid panel  2. Acute systolic CHF (congestive heart failure) (HCC) - CMP14+EGFR - CBC with Differential/Platelet - Bayer DCA Hb A1c Waived - Lipid panel   Continue all other maintenance medications as listed above.  Follow up plan: Return in about 3 months (around 10/05/2017) for recheck.  Educational handout given for [place] PA-C [facility] [address]  [place], [place] [address] [phone]   07/08/2017, 9:45 PM

## 2017-07-11 ENCOUNTER — Telehealth: Payer: Self-pay | Admitting: Physician Assistant

## 2017-07-11 NOTE — Telephone Encounter (Signed)
No answer and no voice mail.

## 2017-07-12 NOTE — Telephone Encounter (Signed)
Spoke to pt's mother (on ROI) and reviewed all test results.

## 2017-08-01 ENCOUNTER — Encounter: Payer: Self-pay | Admitting: Cardiology

## 2017-08-01 ENCOUNTER — Ambulatory Visit (INDEPENDENT_AMBULATORY_CARE_PROVIDER_SITE_OTHER): Payer: Medicare Other | Admitting: Cardiology

## 2017-08-01 VITALS — BP 132/72 | HR 75 | Ht 75.0 in | Wt 292.0 lb

## 2017-08-01 DIAGNOSIS — G4730 Sleep apnea, unspecified: Secondary | ICD-10-CM | POA: Diagnosis not present

## 2017-08-01 DIAGNOSIS — I428 Other cardiomyopathies: Secondary | ICD-10-CM | POA: Diagnosis not present

## 2017-08-01 DIAGNOSIS — I5022 Chronic systolic (congestive) heart failure: Secondary | ICD-10-CM | POA: Diagnosis not present

## 2017-08-01 NOTE — Patient Instructions (Signed)
Medication Instructions:  Your physician recommends that you continue on your current medications as directed. Please refer to the Current Medication list given to you today.   Labwork: NONE  Testing/Procedures: Your physician has requested that you have an echocardiogram. Echocardiography is a painless test that uses sound waves to create images of your heart. It provides your doctor with information about the size and shape of your heart and how well your heart's chambers and valves are working. This procedure takes approximately one hour. There are no restrictions for this procedure.    Follow-Up: Your physician recommends that you schedule a follow-up appointment in: 3 MONTHS    Any Other Special Instructions Will Be Listed Below (If Applicable). You have been referred to DR. TRACI TURNER @ [facility] office to evaluate sleep apnea.      If you need a refill on your cardiac medications before your next appointment, please call your pharmacy.

## 2017-08-01 NOTE — Progress Notes (Signed)
Clinical Summary Mr. Bukhari is a 47 y.o.male seen today for follow up of the following medical problems.   1. Chronic systolic HF - new diagnosis during 08/2016 admission with fluid overload - 02/2017 cath no significant CAD.  - no recent symptoms, No SOB/DOE - compliant with meds. Home weights stable around 286 lbs.        2. OSA screen -has had difficultly arranging appt with Dr Luan Pulling     Past Medical History:  Diagnosis Date  . Allergy   . Autism   . CHF (congestive heart failure) ([place])    a. EF 20-25% by echo in 08/2016 b. repeat echo in 02/2017 showing EF 30-35% with cath showing normal cors.   . Diabetes mellitus without complication ([place])      No Known Allergies   Current Outpatient Medications  Medication Sig Dispense Refill  . carvedilol (COREG) 25 MG tablet Take 1 tablet (25 mg total) by mouth 2 (two) times daily. 180 tablet 3  . furosemide (LASIX) 40 MG tablet Take 40 mg by mouth daily.     . furosemide (LASIX) 40 MG tablet TAKE ONE (1) TABLET EACH DAY 90 tablet 3  . glipiZIDE (GLUCOTROL) 5 MG tablet Take 1 tablet (5 mg total) by mouth 2 (two) times daily before a meal. 60 tablet 3  . lisinopril (PRINIVIL,ZESTRIL) 5 MG tablet Take 1 tablet (5 mg total) by mouth daily. 90 tablet 3  . loratadine (CLARITIN) 10 MG tablet TAKE ONE (1) TABLET EACH DAY 90 tablet 0  . metFORMIN (GLUCOPHAGE) 500 MG tablet Take 1 tablet (500 mg total) by mouth daily. 90 tablet 3   No current facility-administered medications for this visit.      Past Surgical History:  Procedure Laterality Date  . RIGHT/LEFT HEART CATH AND CORONARY ANGIOGRAPHY N/A 03/06/2017   Procedure: RIGHT/LEFT HEART CATH AND CORONARY ANGIOGRAPHY;  Surgeon: Burnell Blanks, MD;  Location: [facility];  Service: Cardiovascular;  Laterality: N/A;     No Known Allergies    Family History  Problem Relation Age of Onset  . Diabetes Mother   . Cancer Father   . Diabetes Father        Social History Mr. Hoglund reports that he has never smoked. He has never used smokeless tobacco. Mr. Renaldo reports that he does not drink alcohol.   Review of Systems CONSTITUTIONAL: No weight loss, fever, chills, weakness or fatigue.  HEENT: Eyes: No visual loss, blurred vision, double vision or yellow sclerae.No hearing loss, sneezing, congestion, runny nose or sore throat.  SKIN: No rash or itching.  CARDIOVASCULAR: per hpi RESPIRATORY: No shortness of breath, cough or sputum.  GASTROINTESTINAL: No anorexia, nausea, vomiting or diarrhea. No abdominal pain or blood.  GENITOURINARY: No burning on urination, no polyuria NEUROLOGICAL: No headache, dizziness, syncope, paralysis, ataxia, numbness or tingling in the extremities. No change in bowel or bladder control.  MUSCULOSKELETAL: No muscle, back pain, joint pain or stiffness.  LYMPHATICS: No enlarged nodes. No history of splenectomy.  PSYCHIATRIC: No history of depression or anxiety.  ENDOCRINOLOGIC: No reports of sweating, cold or heat intolerance. No polyuria or polydipsia.  Marland Kitchen   Physical Examination Vitals:   08/01/17 1129  BP: 132/72  Pulse: 75  SpO2: 97%   Vitals:   08/01/17 1129  Weight: 292 lb (132.5 kg)  Height: 6\' 3"  (1.905 m)    Gen: resting comfortably, no acute distress HEENT: no scleral icterus, pupils equal round and reactive, no  palptable cervical adenopathy,  CV: RRR, no m/r/g, no jvd Resp: Clear to auscultation bilaterally GI: abdomen is soft, non-tender, non-distended, normal bowel sounds, no hepatosplenomegaly MSK: extremities are warm, no edema.  Skin: warm, no rash Neuro:  no focal deficits Psych: appropriate affect   Diagnostic Studies  02/2017 cath  Hemodynamic findings consistent with mild pulmonary hypertension.   1. No angiographic evidence of CAD 2. Non-ischemic cardiomyopathy  Recommendations: medical management of cardiomyopathy and CHF.    02/2017 echo Study  Conclusions  - Left ventricle: The cavity size was mildly dilated. Wall   thickness was normal. Systolic function was moderately to   severely reduced. The estimated ejection fraction was in the   range of 30% to 35%. Diffuse hypokinesis. Diastolic dysfunction,   grade indeterminate. Indeterminate filling pressures. - Mitral valve: There was mild regurgitation.   Assessment and Plan  1. Chronic systolic HF/ NICM - LVEF 27-78%, NYHA II. Doing well currently from symptoms standpoint - we will repeat echo, if persistent LVEF <35% refer for ICD consideration and also consider changing to entresto  2. OSA screen - signs and symptoms of OSA - will refer to Dr Radford Pax for evaluation      Arnoldo Lenis, M.D.

## 2017-08-06 ENCOUNTER — Ambulatory Visit (HOSPITAL_COMMUNITY)
Admission: RE | Admit: 2017-08-06 | Discharge: 2017-08-06 | Disposition: A | Discharge status: Home / Self Care | Payer: Medicare Other | Source: Ambulatory Visit | Attending: Cardiology | Admitting: Cardiology

## 2017-08-06 DIAGNOSIS — I5022 Chronic systolic (congestive) heart failure: Secondary | ICD-10-CM | POA: Diagnosis not present

## 2017-08-06 DIAGNOSIS — F840 Autistic disorder: Secondary | ICD-10-CM | POA: Insufficient documentation

## 2017-08-06 DIAGNOSIS — E119 Type 2 diabetes mellitus without complications: Secondary | ICD-10-CM | POA: Diagnosis not present

## 2017-08-06 DIAGNOSIS — I110 Hypertensive heart disease with heart failure: Secondary | ICD-10-CM | POA: Insufficient documentation

## 2017-08-06 DIAGNOSIS — I420 Dilated cardiomyopathy: Secondary | ICD-10-CM | POA: Diagnosis not present

## 2017-08-06 NOTE — Progress Notes (Signed)
*  PRELIMINARY RESULTS* Echocardiogram 2D Echocardiogram has been performed.  Leavy Cella 08/06/2017, 12:11 PM

## 2017-08-07 ENCOUNTER — Encounter: Payer: Self-pay | Admitting: Cardiology

## 2017-08-07 ENCOUNTER — Telehealth: Payer: Self-pay

## 2017-08-07 DIAGNOSIS — Z79899 Other long term (current) drug therapy: Secondary | ICD-10-CM

## 2017-08-07 MED ORDER — SACUBITRIL-VALSARTAN 24-26 MG PO TABS: 1.0000 | ORAL_TABLET | Freq: Two times a day (BID) | ORAL | 3 refills | Status: DC

## 2017-08-07 NOTE — Telephone Encounter (Signed)
Called pt's sister, made her aware of results. Made medication changes, put lab order in.

## 2017-08-07 NOTE — Telephone Encounter (Signed)
-----   Message from Arnoldo Lenis, MD sent at 08/07/2017  9:10 AM EDT ----- Echo shows LVEF has mildly imprved up to 35-40% (normal is 50-60%). He will not require an ICD. I would like to stop his lisinopril , WAIT 48 HOURS then started entresto 24/26mg  bid. BMET in 2 weeks  Zandra Abts MD

## 2017-08-28 ENCOUNTER — Other Ambulatory Visit (HOSPITAL_COMMUNITY)
Admission: RE | Admit: 2017-08-28 | Discharge: 2017-08-28 | Disposition: A | Discharge status: Home / Self Care | Payer: Medicare Other | Source: Ambulatory Visit | Attending: Cardiology | Admitting: Cardiology

## 2017-08-28 DIAGNOSIS — Z79899 Other long term (current) drug therapy: Secondary | ICD-10-CM | POA: Insufficient documentation

## 2017-08-28 LAB — BASIC METABOLIC PANEL
ANION GAP: 7 (ref 5–15)
BUN: 17 mg/dL (ref 6–20)
CHLORIDE: 101 mmol/L (ref 101–111)
CO2: 29 mmol/L (ref 22–32)
CREATININE: 1.4 mg/dL — AB (ref 0.61–1.24)
Calcium: 8.6 mg/dL — ABNORMAL LOW (ref 8.9–10.3)
GFR calc non Af Amer: 59 mL/min — ABNORMAL LOW (ref >60)
Glucose, Bld: 172 mg/dL — ABNORMAL HIGH (ref 65–99)
POTASSIUM: 3.3 mmol/L — AB (ref 3.5–5.1)
SODIUM: 137 mmol/L (ref 135–145)

## 2017-08-30 ENCOUNTER — Telehealth: Payer: Self-pay

## 2017-08-30 DIAGNOSIS — Z79899 Other long term (current) drug therapy: Secondary | ICD-10-CM

## 2017-08-30 MED ORDER — POTASSIUM CHLORIDE CRYS ER 20 MEQ PO TBCR: 40.0000 meq | EXTENDED_RELEASE_TABLET | Freq: Every day | ORAL | 3 refills | Status: DC

## 2017-08-30 NOTE — Telephone Encounter (Signed)
I spoke with sister, they will pick up potassium frm phamacy, I mailed lab slip for BMET

## 2017-08-30 NOTE — Telephone Encounter (Signed)
-----   Message from Charlie Pitter, [place] sent at 08/29/2017 12:06 PM EDT ----- Michael Ramos to add: correction - add KCl 47meq daily. Please also inform pt blood sugar is elevated. THank you!

## 2017-09-06 ENCOUNTER — Telehealth: Payer: Self-pay | Admitting: Cardiology

## 2017-09-06 NOTE — Telephone Encounter (Signed)
Received orders for labs in the mail and he just had labs drawn last week, does he need to have more drawn? Please call [name] @ [phone]

## 2017-09-06 NOTE — Telephone Encounter (Signed)
Returned call to [name], informed her that the new lab orders was sent due to the increase of his potassium. She voiced understanding.

## 2017-09-13 ENCOUNTER — Other Ambulatory Visit (HOSPITAL_COMMUNITY)
Admission: RE | Admit: 2017-09-13 | Discharge: 2017-09-13 | Disposition: A | Discharge status: Home / Self Care | Payer: Medicare Other | Source: Ambulatory Visit | Attending: Cardiology | Admitting: Cardiology

## 2017-09-13 DIAGNOSIS — Z79899 Other long term (current) drug therapy: Secondary | ICD-10-CM | POA: Insufficient documentation

## 2017-09-13 LAB — BASIC METABOLIC PANEL
Anion gap: 6 (ref 5–15)
BUN: 23 mg/dL — ABNORMAL HIGH (ref 6–20)
CALCIUM: 9.2 mg/dL (ref 8.9–10.3)
CO2: 29 mmol/L (ref 22–32)
Chloride: 106 mmol/L (ref 101–111)
Creatinine, Ser: 1.37 mg/dL — ABNORMAL HIGH (ref 0.61–1.24)
GFR calc Af Amer: >60 mL/min (ref >60)
GLUCOSE: 153 mg/dL — AB (ref 65–99)
POTASSIUM: 4.3 mmol/L (ref 3.5–5.1)
SODIUM: 141 mmol/L (ref 135–145)

## 2017-09-21 ENCOUNTER — Encounter: Payer: Self-pay | Admitting: Cardiology

## 2017-10-01 ENCOUNTER — Other Ambulatory Visit: Payer: Self-pay | Admitting: Physician Assistant

## 2017-10-01 DIAGNOSIS — J3089 Other allergic rhinitis: Secondary | ICD-10-CM

## 2017-10-23 ENCOUNTER — Ambulatory Visit (INDEPENDENT_AMBULATORY_CARE_PROVIDER_SITE_OTHER): Payer: Medicare Other | Admitting: Cardiology

## 2017-10-23 ENCOUNTER — Encounter: Payer: Self-pay | Admitting: Cardiology

## 2017-10-23 ENCOUNTER — Encounter (INDEPENDENT_AMBULATORY_CARE_PROVIDER_SITE_OTHER): Payer: Self-pay

## 2017-10-23 VITALS — BP 122/84 | HR 86 | Ht 75.0 in | Wt 297.0 lb

## 2017-10-23 DIAGNOSIS — G4709 Other insomnia: Secondary | ICD-10-CM | POA: Diagnosis not present

## 2017-10-23 DIAGNOSIS — E669 Obesity, unspecified: Secondary | ICD-10-CM

## 2017-10-23 DIAGNOSIS — I10 Essential (primary) hypertension: Secondary | ICD-10-CM

## 2017-10-23 HISTORY — DX: Obesity, unspecified: E66.9

## 2017-10-23 NOTE — Progress Notes (Signed)
Cardiology Office Note:    Date:  10/23/2017   ID:  Michael Ramos, DOB 31-Jan-1971, MRN [id number]  PCP:  Terald Sleeper, PA-C  Cardiologist:  Carlyle Dolly, MD    Referring MD: Terald Sleeper, PA-C   Chief Complaint  Patient presents with  . Sleep Apnea    History of Present Illness:    Michael Ramos is a 47 y.o. male with a hx of chronic systolic heart failure and diabetes who was referred here by Dr. Carlyle Dolly for evaluation of sleep apnea.  He had complained of symptoms of obstructive sleep apnea Dr. Harl Bowie and given his chronic systolic heart failure and nonischemic dilated cardiomyopathy was felt that he should have a further evaluation for obstructive sleep apnea with sleep study.  He is now here for evaluation.  He has significant autism so most of the history is obtained by his sister and mother who are in the room with him today.  They said he has very erratic sleep and will stay up late at night watching things on his iPod and then will sleep some during the day but not a lot.  His mom says she has not really heard him snore much.  He he states that he is not really that tired when he wakes up and really does not feel sleepy during the day.  His mom says once in a while for sleep during the day.  Past Medical History:  Diagnosis Date  . Allergy   . Autism   . CHF (congestive heart failure) ([place])    a. EF 20-25% by echo in 08/2016 b. repeat echo in 02/2017 showing EF 30-35% with cath showing normal cors.   . Diabetes mellitus without complication ([place])   . Obesity (BMI 30-39.9) 10/23/2017    Past Surgical History:  Procedure Laterality Date  . RIGHT/LEFT HEART CATH AND CORONARY ANGIOGRAPHY N/A 03/06/2017   Procedure: RIGHT/LEFT HEART CATH AND CORONARY ANGIOGRAPHY;  Surgeon: Burnell Blanks, MD;  Location: [facility];  Service: Cardiovascular;  Laterality: N/A;    Current Medications: Current Meds  Medication Sig  . carvedilol (COREG) 25 MG  tablet Take 1 tablet (25 mg total) by mouth 2 (two) times daily.  . furosemide (LASIX) 40 MG tablet Take 40 mg by mouth daily.   Marland Kitchen glipiZIDE (GLUCOTROL) 5 MG tablet Take 1 tablet (5 mg total) by mouth 2 (two) times daily before a meal.  . loratadine (CLARITIN) 10 MG tablet TAKE ONE (1) TABLET EACH DAY  . metFORMIN (GLUCOPHAGE) 500 MG tablet Take 1 tablet (500 mg total) by mouth daily.  . potassium chloride SA (KLOR-CON M20) 20 MEQ tablet Take 2 tablets (40 mEq total) by mouth daily.  . sacubitril-valsartan (ENTRESTO) 24-26 MG Take 1 tablet by mouth 2 (two) times daily.     Allergies:   Patient has no known allergies.   Social History   Socioeconomic History  . Marital status: Single    Spouse name: Not on file  . Number of children: Not on file  . Years of education: Not on file  . Highest education level: Not on file  Occupational History  . Not on file  Social Needs  . Financial resource strain: Not on file  . Food insecurity:    Worry: Not on file    Inability: Not on file  . Transportation needs:    Medical: Not on file    Non-medical: Not on file  Tobacco Use  .  Smoking status: Never Smoker  . Smokeless tobacco: Never Used  Substance and Sexual Activity  . Alcohol use: No  . Drug use: No  . Sexual activity: Not Currently  Lifestyle  . Physical activity:    Days per week: Not on file    Minutes per session: Not on file  . Stress: Not on file  Relationships  . Social connections:    Talks on phone: Not on file    Gets together: Not on file    Attends religious service: Not on file    Active member of club or organization: Not on file    Attends meetings of clubs or organizations: Not on file    Relationship status: Not on file  Other Topics Concern  . Not on file  Social History Narrative  . Not on file     Family History: The patient's family history includes Cancer in his father; Diabetes in his father and mother.  ROS:   Please see the history of  present illness.    ROS  All other systems reviewed and negative.   EKGs/Labs/Other Studies Reviewed:    The following studies were reviewed today: none  EKG:  EKG is not ordered today.  Recent Labs: 07/05/2017: ALT 17; Hemoglobin 14.3; Platelets 227 09/13/2017: BUN 23; Creatinine, Ser 1.37; Potassium 4.3; Sodium 141   Recent Lipid Panel    Component Value Date/Time   CHOL 138 07/05/2017 1228   TRIG 257 (H) 07/05/2017 1228   HDL 45 07/05/2017 1228   CHOLHDL 3.1 07/05/2017 1228   LDLCALC 42 07/05/2017 1228    Physical Exam:    VS:  BP 122/84 (BP Location: Left Arm, Patient Position: Sitting, Cuff Size: Large)   Pulse 86   Ht 6\' 3"  (1.905 m)   Wt 297 lb (134.7 kg)   SpO2 96%   BMI 37.12 kg/m     Wt Readings from Last 3 Encounters:  10/23/17 297 lb (134.7 kg)  08/01/17 292 lb (132.5 kg)  07/05/17 288 lb (130.6 kg)     GEN:  Well nourished, well developed in no acute distress HEENT: Normal NECK: No JVD; No carotid bruits LYMPHATICS: No lymphadenopathy CARDIAC: RRR, no murmurs, rubs, gallops RESPIRATORY:  Clear to auscultation without rales, wheezing or rhonchi  ABDOMEN: Soft, non-tender, non-distended MUSCULOSKELETAL:  No edema; No deformity  SKIN: Warm and dry NEUROLOGIC:  Alert and oriented x 3 PSYCHIATRIC:  Normal affect   ASSESSMENT:    1. Other insomnia   2. Obesity (BMI 30-39.9)   3. Essential hypertension    PLAN:    In order of problems listed above:  1.  Insomnia -I am not convinced a lot of his sleep issues are related to obstructive sleep apnea.  He denies any daytime sleepiness and feels rested when he gets up in the morning.  His mother says that he is not snoring at night.  He spends a lot of time staying up at night watching his iPod which may be part of the problem with him then getting to sleep and being able to sleep all night.  Talked about sleep hygiene and his mom taking his iPad away so that he can sleep during the night.  He does have  congestive heart failure and is obese and therefore at risk of obstructive sleep apnea.  Given his significant autism I am concerned that he may not tolerate an in lab sleep study even with his mom there.  His mother is very concerned that  he may not be able to keep the nasal cannula and other wires on for home sleep study as well.  Therefore I recommended that we start with an overnight pulse oximetry at home.  If he does not have any significant hypoxemia then would not recommend pursuing further sleep work-up since he has no excessive daytime sleepiness.  If he does have evidence of hypoxia at night then would recommend an in lab study with 1 of his family members present.  2.  Obesity - I have encouraged him to get into a routine exercise program and cut back on carbs and portions.   3.  HTN - he is well controlled on exam today.  He will continue on Entresto 24-26 mg twice daily, carvedilol 25 mg twice daily.   Medication Adjustments/Labs and Tests Ordered: Current medicines are reviewed at length with the patient today.  Concerns regarding medicines are outlined above.  No orders of the defined types were placed in this encounter.  No orders of the defined types were placed in this encounter.   Signed, Fransico Him, MD  10/23/2017 12:35 PM    [place]

## 2017-10-23 NOTE — Patient Instructions (Addendum)
Medication Instructions:  Your physician recommends that you continue on your current medications as directed. Please refer to the Current Medication list given to you today.  Labwork: NONE  Testing/Procedures: Your physician recommends that you have a overnight pulse ox check. [facility] will call you to set up.   Follow-Up: Your physician recommends that you follow as needed with Dr. Radford Pax.    If you need a refill on your cardiac medications before your next appointment, please call your pharmacy.

## 2017-10-25 ENCOUNTER — Telehealth: Payer: Self-pay | Admitting: *Deleted

## 2017-10-25 DIAGNOSIS — G4730 Sleep apnea, unspecified: Secondary | ICD-10-CM

## 2017-10-25 NOTE — Telephone Encounter (Signed)
Order sent to [facility] patient would like the [place] office.

## 2017-10-25 NOTE — Telephone Encounter (Signed)
-----   Message from Michaelyn Barter, RN sent at 10/23/2017 11:33 AM EDT ----- Needs overnight pulse ox decrease CHF

## 2017-10-29 ENCOUNTER — Other Ambulatory Visit: Payer: Self-pay | Admitting: Cardiology

## 2017-10-31 ENCOUNTER — Telehealth: Payer: Self-pay | Admitting: Cardiology

## 2017-10-31 NOTE — Telephone Encounter (Signed)
Michael Ramos -sister called about patient receiving a sleep apnea machine.  Please clal [phone]

## 2017-11-01 ENCOUNTER — Encounter: Payer: Self-pay | Admitting: Cardiology

## 2017-11-01 ENCOUNTER — Ambulatory Visit (INDEPENDENT_AMBULATORY_CARE_PROVIDER_SITE_OTHER): Payer: Medicare Other | Admitting: Cardiology

## 2017-11-01 ENCOUNTER — Other Ambulatory Visit: Payer: Self-pay

## 2017-11-01 VITALS — BP 110/76 | HR 85 | Ht 75.0 in | Wt 299.0 lb

## 2017-11-01 DIAGNOSIS — I5022 Chronic systolic (congestive) heart failure: Secondary | ICD-10-CM | POA: Diagnosis not present

## 2017-11-01 MED ORDER — SACUBITRIL-VALSARTAN 49-51 MG PO TABS: 1.0000 | ORAL_TABLET | Freq: Two times a day (BID) | ORAL | 3 refills | Status: DC

## 2017-11-01 NOTE — Telephone Encounter (Signed)
Patient's sister Olegario Shearer) per DPR was advised that [facility] would call them. Olegario Shearer states someone called but she needed the phone number to call them back. Phone number provided was [phone] for [place] or [phone] [facility]. Sister is aware and agreeable to treatment.

## 2017-11-01 NOTE — Patient Instructions (Signed)
Your physician recommends that you schedule a follow-up appointment in: [place]  Your physician has recommended you make the following change in your medication:   INCREASE ENTRESTO 49/51 MG TWICE DAILY  Your physician recommends that you return for lab work in: 2 WEEKS [place]  Thank you for choosing [facility]!!

## 2017-11-01 NOTE — Progress Notes (Signed)
Clinical Summary Mr. Mcmanamon is a 47 y.o.male  seen today for follow up of the following medical problems.   1. Chronic systolic HF - new diagnosis during 08/2016 admission with fluid overload - 02/2017 cath no significant CAD.   07/2017 echo LVEF 35-40%, grade II diastolic dysfunction - he deneis any SOB/DOE, no LE edema - compliant with meds      2. OSA screen - seen by Dr Radford Pax. Plans for overnight O2 sat monitor, if abnormal possible sleep study. Discussed his very poor sleep hygiene.       Past Medical History:  Diagnosis Date  . Allergy   . Autism   . CHF (congestive heart failure) ([place])    a. EF 20-25% by echo in 08/2016 b. repeat echo in 02/2017 showing EF 30-35% with cath showing normal cors.   . Diabetes mellitus without complication ([place])   . Obesity (BMI 30-39.9) 10/23/2017     No Known Allergies   Current Outpatient Medications  Medication Sig Dispense Refill  . carvedilol (COREG) 25 MG tablet TAKE ONE TABLET BY MOUTH TWICE DAILY 180 tablet 3  . furosemide (LASIX) 40 MG tablet Take 40 mg by mouth daily.     Marland Kitchen glipiZIDE (GLUCOTROL) 5 MG tablet Take 1 tablet (5 mg total) by mouth 2 (two) times daily before a meal. 60 tablet 3  . loratadine (CLARITIN) 10 MG tablet TAKE ONE (1) TABLET EACH DAY 90 tablet 1  . metFORMIN (GLUCOPHAGE) 500 MG tablet Take 1 tablet (500 mg total) by mouth daily. 90 tablet 3  . potassium chloride SA (KLOR-CON M20) 20 MEQ tablet Take 2 tablets (40 mEq total) by mouth daily. 180 tablet 3  . sacubitril-valsartan (ENTRESTO) 24-26 MG Take 1 tablet by mouth 2 (two) times daily. 60 tablet 3   No current facility-administered medications for this visit.      Past Surgical History:  Procedure Laterality Date  . RIGHT/LEFT HEART CATH AND CORONARY ANGIOGRAPHY N/A 03/06/2017   Procedure: RIGHT/LEFT HEART CATH AND CORONARY ANGIOGRAPHY;  Surgeon: Burnell Blanks, MD;  Location: [facility];  Service:  Cardiovascular;  Laterality: N/A;     No Known Allergies    Family History  Problem Relation Age of Onset  . Diabetes Mother   . Cancer Father   . Diabetes Father      Social History Mr. Cheatum reports that he has never smoked. He has never used smokeless tobacco. Mr. Shieh reports that he does not drink alcohol.   Review of Systems CONSTITUTIONAL: No weight loss, fever, chills, weakness or fatigue.  HEENT: Eyes: No visual loss, blurred vision, double vision or yellow sclerae.No hearing loss, sneezing, congestion, runny nose or sore throat.  SKIN: No rash or itching.  CARDIOVASCULAR: per hpi RESPIRATORY: No shortness of breath, cough or sputum.  GASTROINTESTINAL: No anorexia, nausea, vomiting or diarrhea. No abdominal pain or blood.  GENITOURINARY: No burning on urination, no polyuria NEUROLOGICAL: No headache, dizziness, syncope, paralysis, ataxia, numbness or tingling in the extremities. No change in bowel or bladder control.  MUSCULOSKELETAL: No muscle, back pain, joint pain or stiffness.  LYMPHATICS: No enlarged nodes. No history of splenectomy.  PSYCHIATRIC: No history of depression or anxiety.  ENDOCRINOLOGIC: No reports of sweating, cold or heat intolerance. No polyuria or polydipsia.  Marland Kitchen   Physical Examination Vitals:   11/01/17 1029  BP: 110/76  Pulse: 85  SpO2: 95%   Vitals:   11/01/17 1029  Weight: 299 lb (135.6 kg)  Height: 6\' 3"  (1.905 m)    Gen: resting comfortably, no acute distress HEENT: no scleral icterus, pupils equal round and reactive, no palptable cervical adenopathy,  CV: RRR, no m/r/g, no jvd Resp: Clear to auscultation bilaterally GI: abdomen is soft, non-tender, non-distended, normal bowel sounds, no hepatosplenomegaly MSK: extremities are warm, no edema.  Skin: warm, no rash Neuro:  no focal deficits Psych: appropriate affect   Diagnostic Studies 02/2017 cath  Hemodynamic findings consistent with mild pulmonary  hypertension.  1. No angiographic evidence of CAD 2. Non-ischemic cardiomyopathy  Recommendations: medical management of cardiomyopathy and CHF.    02/2017 echo Study Conclusions  - Left ventricle: The cavity size was mildly dilated. Wall thickness was normal. Systolic function was moderately to severely reduced. The estimated ejection fraction was in the range of 30% to 35%. Diffuse hypokinesis. Diastolic dysfunction, grade indeterminate. Indeterminate filling pressures. - Mitral valve: There was mild regurgitation.   07/2017 echo Study Conclusions  - Left ventricle: The cavity size was mildly dilated. Wall   thickness was normal. Systolic function was moderately reduced.   The estimated ejection fraction was in the range of 35% to 40%.   Diffuse hypokinesis. Features are consistent with a pseudonormal   left ventricular filling pattern, with concomitant abnormal   relaxation and increased filling pressure (grade 2 diastolic   dysfunction). - Mitral valve: There was mild regurgitation.  Assessment and Plan   1. Chronic systolic HF/ NICM - LVEF 17-51%, NYHA II. He will not require and ICD - no recent symptoms.  - increase entresto to 49/51mg  bid, check BMET in 2 weeks.   - nursing visit 2 weeks, if stable vitals and labs increase entresto.  f/u with me in 3 months      Arnoldo Lenis, M.D.

## 2017-11-02 ENCOUNTER — Ambulatory Visit: Payer: Medicare Other | Admitting: Cardiology

## 2017-11-12 ENCOUNTER — Encounter: Payer: Self-pay | Admitting: Cardiology

## 2017-11-15 ENCOUNTER — Ambulatory Visit (INDEPENDENT_AMBULATORY_CARE_PROVIDER_SITE_OTHER): Payer: Medicare Other

## 2017-11-15 VITALS — BP 116/77 | HR 76

## 2017-11-15 DIAGNOSIS — I10 Essential (primary) hypertension: Secondary | ICD-10-CM

## 2017-11-15 NOTE — Progress Notes (Signed)
BP looks fine, did he go and have his labs done?   Zandra Abts MD

## 2017-11-15 NOTE — Progress Notes (Signed)
Patient came into office today for BP check. BP 116/77. Patient has been compliant with medications and took medication this morning.

## 2017-11-15 NOTE — Progress Notes (Signed)
Patient had labs drawn today at [facility]. Will request them this afternoon

## 2017-11-16 ENCOUNTER — Ambulatory Visit (INDEPENDENT_AMBULATORY_CARE_PROVIDER_SITE_OTHER): Payer: Medicare Other | Admitting: Physician Assistant

## 2017-11-16 ENCOUNTER — Encounter: Payer: Self-pay | Admitting: Physician Assistant

## 2017-11-16 VITALS — BP 126/83 | HR 81 | Temp 96.8°F | Ht 75.0 in | Wt 295.0 lb

## 2017-11-16 DIAGNOSIS — L309 Dermatitis, unspecified: Secondary | ICD-10-CM

## 2017-11-16 MED ORDER — HYDROCORTISONE 2.5 % RE CREA: 1.0000 "application " | TOPICAL_CREAM | Freq: Two times a day (BID) | RECTAL | 0 refills | Status: DC

## 2017-11-16 MED ORDER — PREDNISONE 10 MG (21) PO TBPK: ORAL_TABLET | ORAL | 0 refills | Status: DC

## 2017-11-16 NOTE — Progress Notes (Signed)
Area on the back of left lower leg.  Has been there about a month. No drainage, pus, redness or itching.  He has not had any weeping on the sock. Family history of psoriasis.      BP 126/83   Pulse 81   Temp (!) 96.8 F (36 C) (Oral)   Ht 6\' 3"  (1.905 m)   Wt 295 lb (133.8 kg)   BMI 36.87 kg/m    Subjective:    Patient ID: Michael Ramos, male    DOB: 31-Jul-1970, 47 y.o.   MRN: [id number]  HPI: Michael Ramos is a 47 y.o. male presenting on 11/16/2017 for Sore on back of left calf    Past Medical History:  Diagnosis Date  . Allergy   . Autism   . CHF (congestive heart failure) ([place])    a. EF 20-25% by echo in 08/2016 b. repeat echo in 02/2017 showing EF 30-35% with cath showing normal cors.   . Diabetes mellitus without complication ([place])   . Obesity (BMI 30-39.9) 10/23/2017   Relevant past medical, surgical, family and social history reviewed and updated as indicated. Interim medical history since our last visit reviewed. Allergies and medications reviewed and updated. DATA REVIEWED: CHART IN EPIC  Family History reviewed for pertinent findings.  Review of Systems  Constitutional: Negative.  Negative for appetite change and fatigue.  Eyes: Negative for pain and visual disturbance.  Respiratory: Negative.  Negative for cough, chest tightness, shortness of breath and wheezing.   Cardiovascular: Negative.  Negative for chest pain, palpitations and leg swelling.  Gastrointestinal: Negative.  Negative for abdominal pain, diarrhea, nausea and vomiting.  Genitourinary: Negative.   Skin: Positive for color change and rash.  Neurological: Negative.  Negative for weakness, numbness and headaches.  Psychiatric/Behavioral: Negative.     Allergies as of 11/16/2017   No Known Allergies     Medication List        Accurate as of 11/16/17  9:31 AM. Always use your most recent med list.          carvedilol 25 MG tablet Commonly known as:  COREG TAKE ONE TABLET BY MOUTH TWICE  DAILY   furosemide 40 MG tablet Commonly known as:  LASIX Take 40 mg by mouth daily.   glipiZIDE 5 MG tablet Commonly known as:  GLUCOTROL Take 1 tablet (5 mg total) by mouth 2 (two) times daily before a meal.   hydrocortisone 2.5 % rectal cream Commonly known as:  ANUSOL-HC Place 1 application rectally 2 (two) times daily.   loratadine 10 MG tablet Commonly known as:  CLARITIN TAKE ONE (1) TABLET EACH DAY   metFORMIN 500 MG tablet Commonly known as:  GLUCOPHAGE Take 1 tablet (500 mg total) by mouth daily.   potassium chloride SA 20 MEQ tablet Commonly known as:  K-DUR,KLOR-CON Take 2 tablets (40 mEq total) by mouth daily.   predniSONE 10 MG (21) Tbpk tablet Commonly known as:  STERAPRED UNI-PAK 21 TAB As directed x 6 days   sacubitril-valsartan 49-51 MG Commonly known as:  ENTRESTO Take 1 tablet by mouth 2 (two) times daily.          Objective:    BP 126/83   Pulse 81   Temp (!) 96.8 F (36 C) (Oral)   Ht 6\' 3"  (1.905 m)   Wt 295 lb (133.8 kg)   BMI 36.87 kg/m   No Known Allergies  Wt Readings from Last 3 Encounters:  11/16/17 295 lb (133.8 kg)  11/01/17 299 lb (135.6 kg)  10/23/17 297 lb (134.7 kg)    Physical Exam  Constitutional: He appears well-developed and well-nourished. No distress.  HENT:  Head: Normocephalic and atraumatic.  Eyes: Pupils are equal, round, and reactive to light. Conjunctivae and EOM are normal.  Cardiovascular: Normal rate, regular rhythm and normal heart sounds.  Pulmonary/Chest: Effort normal and breath sounds normal. No respiratory distress.  Skin: Skin is warm and dry. Lesion and rash noted. Rash is papular.     White raised area on posterior leg  Psychiatric: He has a normal mood and affect. His behavior is normal.  Nursing note and vitals reviewed.   Results for orders placed or performed during the hospital encounter of 17/49/44  Basic metabolic panel  Result Value Ref Range   Sodium 141 135 - 145 mmol/L    Potassium 4.3 3.5 - 5.1 mmol/L   Chloride 106 101 - 111 mmol/L   CO2 29 22 - 32 mmol/L   Glucose, Bld 153 (H) 65 - 99 mg/dL   BUN 23 (H) 6 - 20 mg/dL   Creatinine, Ser 1.37 (H) 0.61 - 1.24 mg/dL   Calcium 9.2 8.9 - 10.3 mg/dL   GFR calc non Af Amer >60 >60 mL/min   GFR calc Af Amer >60 >60 mL/min   Anion gap 6 5 - 15      Assessment & Plan:   1. Eczema, unspecified type - predniSONE (STERAPRED UNI-PAK 21 TAB) 10 MG (21) TBPK tablet; As directed x 6 days  Dispense: 21 tablet; Refill: 0 - hydrocortisone (ANUSOL-HC) 2.5 % rectal cream; Place 1 application rectally 2 (two) times daily.  Dispense: 60 g; Refill: 0    Continue all other maintenance medications as listed above.  Follow up plan: Return in about 4 weeks (around 12/14/2017) for recheck.  Educational handout given for [place] PA-C [facility] [address]  [place], [place] [address] [phone]   11/16/2017, 9:31 AM

## 2017-11-20 ENCOUNTER — Telehealth: Payer: Self-pay | Admitting: *Deleted

## 2017-11-20 DIAGNOSIS — I10 Essential (primary) hypertension: Secondary | ICD-10-CM

## 2017-11-20 NOTE — Telephone Encounter (Signed)
Pt sister [name] [facility]) made aware - mailed lab orders as requested - routed to pcp

## 2017-11-20 NOTE — Telephone Encounter (Signed)
-----   Message from Arnoldo Lenis, MD sent at 11/19/2017  1:46 PM EDT ----- Labs look good, no changes at this time. Repeat BMET/Mg in 1 month  Zandra Abts MD

## 2017-12-06 ENCOUNTER — Encounter: Payer: Self-pay | Admitting: Nurse Practitioner

## 2017-12-06 ENCOUNTER — Ambulatory Visit (INDEPENDENT_AMBULATORY_CARE_PROVIDER_SITE_OTHER): Payer: Medicare Other | Admitting: Nurse Practitioner

## 2017-12-06 VITALS — BP 115/77 | HR 87 | Temp 96.9°F | Ht 75.0 in | Wt 297.0 lb

## 2017-12-06 DIAGNOSIS — M5442 Lumbago with sciatica, left side: Secondary | ICD-10-CM | POA: Diagnosis not present

## 2017-12-06 MED ORDER — METHYLPREDNISOLONE ACETATE 80 MG/ML IJ SUSP
80.0000 mg | Freq: Once | INTRAMUSCULAR | Status: AC
  Administered 2017-12-06: 80 mg via INTRAMUSCULAR

## 2017-12-06 MED ORDER — DICLOFENAC SODIUM 1 % TD GEL: 4.0000 g | Freq: Four times a day (QID) | TRANSDERMAL | 2 refills | Status: DC

## 2017-12-06 NOTE — Progress Notes (Addendum)
   Subjective:    Patient ID: Michael Ramos, male    DOB: [birth year], 47 y.o.   MRN: [id number]   Chief Complaint: left leg pain (Bent over and it popped) and Back Pain   HPI Patient is brought in today by mom and sister. He said he bent over to get some water out of refrigerator and he felt something pop in his back. This happened over a week ago. Now has pain radiating down to his knee. They have given him some ibuprofen which really has not helped. Pain is worse when moving and better when sits still. Patient is autistic so difficult to get all information needed. He has seen Dr. Nelva Bush in the past with similar pain.   Review of Systems  Constitutional: Negative.   Respiratory: Negative.   Cardiovascular: Negative.   Genitourinary: Negative.   Neurological: Negative.   Psychiatric/Behavioral: Negative.   All other systems reviewed and are negative.      Objective:   Physical Exam  Constitutional: He is oriented to person, place, and time. He appears well-developed and well-nourished.  Cardiovascular: Normal rate and regular rhythm.  Pulmonary/Chest: Effort normal.  Musculoskeletal:  FROM of lumbar spin with pain on full flexion and rotation Left knee pain on ful extension- no effusion- all ligaments intact  Neurological: He is alert and oriented to person, place, and time.  Skin: Skin is warm and dry.  Psychiatric: He has a normal mood and affect. His behavior is normal. Judgment and thought content normal.  Nursing note and vitals reviewed.   BP 115/77   Pulse 87   Temp (!) 96.9 F (36.1 C) (Oral)   Ht 6\' 3"  (1.905 m)   Wt 297 lb (134.7 kg)   BMI 37.12 kg/m        Assessment & Plan:  Michael Ramos in today with chief complaint of left leg pain (Bent over and it popped) and Back Pain   1. Acute left-sided low back pain with left-sided sciatica Moist heat Rest ' No heavy lifting Strict low carb diet- steroid may increase blood sugars for a couple of  days. - methylPREDNISolone acetate (DEPO-MEDROL) injection 80 mg - Ambulatory referral to Orthopedic Surgery - diclofenac sodium (VOLTAREN) 1 % GEL; Apply 4 g topically 4 (four) times daily.  Dispense: 5 Tube; Refill: [place], FNP

## 2017-12-06 NOTE — Patient Instructions (Signed)

## 2017-12-11 ENCOUNTER — Telehealth: Payer: Self-pay | Admitting: Physician Assistant

## 2017-12-11 NOTE — Telephone Encounter (Signed)
Spoke with referrals and referral is still being worked on - aware and verbalizes understanding.

## 2017-12-18 DIAGNOSIS — B078 Other viral warts: Secondary | ICD-10-CM | POA: Diagnosis not present

## 2017-12-22 DIAGNOSIS — M25552 Pain in left hip: Secondary | ICD-10-CM | POA: Diagnosis not present

## 2017-12-22 DIAGNOSIS — F840 Autistic disorder: Secondary | ICD-10-CM | POA: Diagnosis not present

## 2017-12-22 DIAGNOSIS — I509 Heart failure, unspecified: Secondary | ICD-10-CM | POA: Diagnosis not present

## 2017-12-22 DIAGNOSIS — M545 Low back pain, unspecified: Secondary | ICD-10-CM | POA: Insufficient documentation

## 2017-12-24 ENCOUNTER — Other Ambulatory Visit: Payer: Self-pay | Admitting: Orthopedic Surgery

## 2017-12-24 ENCOUNTER — Other Ambulatory Visit: Payer: Self-pay | Admitting: Physician Assistant

## 2017-12-24 DIAGNOSIS — M545 Low back pain, unspecified: Secondary | ICD-10-CM

## 2017-12-24 NOTE — Telephone Encounter (Signed)
Last A1C 07/05/17

## 2017-12-27 DIAGNOSIS — R600 Localized edema: Secondary | ICD-10-CM | POA: Diagnosis not present

## 2017-12-27 DIAGNOSIS — M5117 Intervertebral disc disorders with radiculopathy, lumbosacral region: Secondary | ICD-10-CM | POA: Diagnosis not present

## 2017-12-27 DIAGNOSIS — M25452 Effusion, left hip: Secondary | ICD-10-CM | POA: Diagnosis not present

## 2017-12-27 DIAGNOSIS — M5126 Other intervertebral disc displacement, lumbar region: Secondary | ICD-10-CM | POA: Diagnosis not present

## 2017-12-31 ENCOUNTER — Other Ambulatory Visit: Payer: Self-pay

## 2017-12-31 ENCOUNTER — Encounter (HOSPITAL_COMMUNITY): Payer: Self-pay | Admitting: Emergency Medicine

## 2017-12-31 ENCOUNTER — Inpatient Hospital Stay (HOSPITAL_COMMUNITY)
Admission: EM | Admit: 2017-12-31 | Discharge: 2018-01-01 | DRG: 543 | Disposition: A | Discharge status: Home / Self Care | Payer: Medicare Other | Attending: Family Medicine | Admitting: Family Medicine

## 2017-12-31 ENCOUNTER — Emergency Department (HOSPITAL_COMMUNITY): Payer: Medicare Other

## 2017-12-31 DIAGNOSIS — Z7984 Long term (current) use of oral hypoglycemic drugs: Secondary | ICD-10-CM | POA: Diagnosis not present

## 2017-12-31 DIAGNOSIS — I130 Hypertensive heart and chronic kidney disease with heart failure and stage 1 through stage 4 chronic kidney disease, or unspecified chronic kidney disease: Secondary | ICD-10-CM | POA: Diagnosis present

## 2017-12-31 DIAGNOSIS — M84452A Pathological fracture, left femur, initial encounter for fracture: Secondary | ICD-10-CM | POA: Diagnosis present

## 2017-12-31 DIAGNOSIS — E669 Obesity, unspecified: Secondary | ICD-10-CM | POA: Diagnosis present

## 2017-12-31 DIAGNOSIS — Z01810 Encounter for preprocedural cardiovascular examination: Secondary | ICD-10-CM | POA: Diagnosis not present

## 2017-12-31 DIAGNOSIS — I5022 Chronic systolic (congestive) heart failure: Secondary | ICD-10-CM

## 2017-12-31 DIAGNOSIS — Z833 Family history of diabetes mellitus: Secondary | ICD-10-CM

## 2017-12-31 DIAGNOSIS — E119 Type 2 diabetes mellitus without complications: Secondary | ICD-10-CM

## 2017-12-31 DIAGNOSIS — I420 Dilated cardiomyopathy: Secondary | ICD-10-CM | POA: Diagnosis present

## 2017-12-31 DIAGNOSIS — E1122 Type 2 diabetes mellitus with diabetic chronic kidney disease: Secondary | ICD-10-CM | POA: Diagnosis present

## 2017-12-31 DIAGNOSIS — J301 Allergic rhinitis due to pollen: Secondary | ICD-10-CM | POA: Diagnosis present

## 2017-12-31 DIAGNOSIS — Z6837 Body mass index (BMI) 37.0-37.9, adult: Secondary | ICD-10-CM

## 2017-12-31 DIAGNOSIS — S72002A Fracture of unspecified part of neck of left femur, initial encounter for closed fracture: Secondary | ICD-10-CM

## 2017-12-31 DIAGNOSIS — S72009A Fracture of unspecified part of neck of unspecified femur, initial encounter for closed fracture: Secondary | ICD-10-CM | POA: Diagnosis present

## 2017-12-31 DIAGNOSIS — N182 Chronic kidney disease, stage 2 (mild): Secondary | ICD-10-CM | POA: Diagnosis present

## 2017-12-31 DIAGNOSIS — F840 Autistic disorder: Secondary | ICD-10-CM | POA: Diagnosis present

## 2017-12-31 DIAGNOSIS — Z79899 Other long term (current) drug therapy: Secondary | ICD-10-CM

## 2017-12-31 DIAGNOSIS — I517 Cardiomegaly: Secondary | ICD-10-CM | POA: Diagnosis not present

## 2017-12-31 LAB — BASIC METABOLIC PANEL
Anion gap: 9 (ref 5–15)
BUN: 17 mg/dL (ref 6–20)
CALCIUM: 9.1 mg/dL (ref 8.9–10.3)
CO2: 25 mmol/L (ref 22–32)
Chloride: 107 mmol/L (ref 98–111)
Creatinine, Ser: 1.31 mg/dL — ABNORMAL HIGH (ref 0.61–1.24)
GFR calc Af Amer: >60 mL/min (ref >60)
GLUCOSE: 139 mg/dL — AB (ref 70–99)
Potassium: 4.8 mmol/L (ref 3.5–5.1)
Sodium: 141 mmol/L (ref 135–145)

## 2017-12-31 LAB — GLUCOSE, CAPILLARY
Glucose-Capillary: 118 mg/dL — ABNORMAL HIGH (ref 70–99)
Glucose-Capillary: 188 mg/dL — ABNORMAL HIGH (ref 70–99)

## 2017-12-31 LAB — MRSA PCR SCREENING: MRSA BY PCR: POSITIVE — AB

## 2017-12-31 LAB — CBC WITH DIFFERENTIAL/PLATELET
BASOS PCT: 1 %
Basophils Absolute: 0 10*3/uL (ref 0.0–0.1)
Eosinophils Absolute: 0.2 10*3/uL (ref 0.0–0.7)
Eosinophils Relative: 2 %
HEMATOCRIT: 41.8 % (ref 39.0–52.0)
Hemoglobin: 13.8 g/dL (ref 13.0–17.0)
LYMPHS ABS: 1.3 10*3/uL (ref 0.7–4.0)
Lymphocytes Relative: 15 %
MCH: 29.7 pg (ref 26.0–34.0)
MCHC: 33 g/dL (ref 30.0–36.0)
MCV: 90.1 fL (ref 78.0–100.0)
MONOS PCT: 8 %
Monocytes Absolute: 0.7 10*3/uL (ref 0.1–1.0)
NEUTROS ABS: 6.4 10*3/uL (ref 1.7–7.7)
Neutrophils Relative %: 74 %
Platelets: 198 10*3/uL (ref 150–400)
RBC: 4.64 MIL/uL (ref 4.22–5.81)
RDW: 13.3 % (ref 11.5–15.5)
WBC: 8.6 10*3/uL (ref 4.0–10.5)

## 2017-12-31 MED ORDER — CARVEDILOL 25 MG PO TABS
25.0000 mg | ORAL_TABLET | Freq: Two times a day (BID) | ORAL | Status: DC
  Administered 2017-12-31 – 2018-01-01 (×3): 25 mg via ORAL
  Filled 2017-12-31 (×3): qty 1

## 2017-12-31 MED ORDER — INSULIN ASPART 100 UNIT/ML ~~LOC~~ SOLN
0.0000 [IU] | Freq: Three times a day (TID) | SUBCUTANEOUS | Status: DC
  Administered 2018-01-01: 5 [IU] via SUBCUTANEOUS

## 2017-12-31 MED ORDER — CHLORHEXIDINE GLUCONATE CLOTH 2 % EX PADS
6.0000 | MEDICATED_PAD | Freq: Every day | CUTANEOUS | Status: DC
  Administered 2018-01-01: 6 via TOPICAL

## 2017-12-31 MED ORDER — MUPIROCIN 2 % EX OINT
1.0000 "application " | TOPICAL_OINTMENT | Freq: Two times a day (BID) | CUTANEOUS | Status: DC
  Administered 2017-12-31 – 2018-01-01 (×2): 1 via NASAL
  Filled 2017-12-31: qty 22

## 2017-12-31 NOTE — ED Notes (Signed)
ED Provider at bedside. 

## 2017-12-31 NOTE — ED Notes (Signed)
ED TO INPATIENT HANDOFF REPORT  Name/Age/Gender Michael Ramos 47 y.o. male  Code Status Code Status History    Date Active Date Inactive Code Status Order ID Comments User Context   03/06/2017 1406 03/06/2017 1932 Full Code [id number]  Burnell Blanks, MD Inpatient   08/15/2016 1817 08/19/2016 1436 Full Code [id number]  Eber Jones, MD Inpatient      Home/SNF/Other Home  Chief Complaint surgery   Level of Care/Admitting Diagnosis ED Disposition    ED Disposition Condition [facility] Area: [facility] [100102]  Level of Care: Telemetry [5]  Admit to tele based on following criteria: Monitor for Ischemic changes  Diagnosis: Hip fracture [facility]) [[id number]]  Admitting Physician: Caren Griffins [phone]  Attending Physician: Caren Griffins [phone]  Estimated length of stay: past midnight tomorrow  Certification:: I certify this patient will need inpatient services for at least 2 midnights  PT Class (Do Not Modify): Inpatient [101]  PT Acc Code (Do Not Modify): Private [1]       Medical History Past Medical History:  Diagnosis Date  . Allergy   . Autism   . CHF (congestive heart failure) ([place])    a. EF 20-25% by echo in 08/2016 b. repeat echo in 02/2017 showing EF 30-35% with cath showing normal cors.   . Diabetes mellitus without complication ([place])   . Obesity (BMI 30-39.9) 10/23/2017    Allergies No Known Allergies  IV Location/Drains/Wounds Patient Lines/Drains/Airways Status   Active Line/Drains/Airways    Name:   Placement date:   Placement time:   Site:   Days:   Peripheral IV 12/31/17 Left Antecubital   12/31/17    1136    Antecubital   less than 1          Labs/Imaging Results for orders placed or performed during the hospital encounter of 12/31/17 (from the past 48 hour(s))  CBC with Differential     Status: None   Collection Time: 12/31/17 11:37 AM  Result Value Ref Range   WBC 8.6 4.0 - 10.5 K/uL    RBC 4.64 4.22 - 5.81 MIL/uL   Hemoglobin 13.8 13.0 - 17.0 g/dL   HCT 41.8 39.0 - 52.0 %   MCV 90.1 78.0 - 100.0 fL   MCH 29.7 26.0 - 34.0 pg   MCHC 33.0 30.0 - 36.0 g/dL   RDW 13.3 11.5 - 15.5 %   Platelets 198 150 - 400 K/uL   Neutrophils Relative % 74 %   Neutro Abs 6.4 1.7 - 7.7 K/uL   Lymphocytes Relative 15 %   Lymphs Abs 1.3 0.7 - 4.0 K/uL   Monocytes Relative 8 %   Monocytes Absolute 0.7 0.1 - 1.0 K/uL   Eosinophils Relative 2 %   Eosinophils Absolute 0.2 0.0 - 0.7 K/uL   Basophils Relative 1 %   Basophils Absolute 0.0 0.0 - 0.1 K/uL    Comment: Performed at [facility], [place] [address], [place], [place] [address]  Basic metabolic panel     Status: Abnormal   Collection Time: 12/31/17 11:37 AM  Result Value Ref Range   Sodium 141 135 - 145 mmol/L   Potassium 4.8 3.5 - 5.1 mmol/L   Chloride 107 98 - 111 mmol/L   CO2 25 22 - 32 mmol/L   Glucose, Bld 139 (H) 70 - 99 mg/dL   BUN 17 6 - 20 mg/dL   Creatinine, Ser 1.31 (H) 0.61 - 1.24 mg/dL  Calcium 9.1 8.9 - 10.3 mg/dL   GFR calc non Af Amer >60 >60 mL/min   GFR calc Af Amer >60 >60 mL/min    Comment: (NOTE) The eGFR has been calculated using the CKD EPI equation. This calculation has not been validated in all clinical situations. eGFR's persistently <60 mL/min signify possible Chronic Kidney Disease.    Anion gap 9 5 - 15    Comment: Performed at [facility], [place] [address], [place], [place] [address]   Dg Chest 2 View  Result Date: 12/31/2017 CLINICAL DATA:  Preoperative evaluation for hip surgery EXAM: CHEST - 2 VIEW COMPARISON:  Aug 18, 2016 FINDINGS: No edema or consolidation. Heart is mildly enlarged with pulmonary vascularity normal. No adenopathy. No bone lesions. No pneumothorax. IMPRESSION: Mild cardiac enlargement.  No edema or consolidation. Electronically Signed   By: Lowella Grip III M.D.   On: 12/31/2017 12:12    Pending Labs [employer] (From  admission, onward)    Start     Ordered   Signed and Held  HIV antibody (Routine Testing)  Once,   R     Signed and Held   Signed and Held  Comprehensive metabolic panel  Tomorrow morning,   R     Signed and Held   Signed and Held  CBC  Tomorrow morning,   R     Signed and Held          Vitals/Pain Today's Vitals   12/31/17 1106 12/31/17 1230 12/31/17 1245 12/31/17 1300  BP:  125/84  (!) 116/94  Pulse:  82 81 85  Resp:  (!) 23 (!) 25 17  Temp: (!) 97.5 F (36.4 C)     TempSrc: Oral     SpO2:  97% 98% 99%  Weight:      Height:        Isolation Precautions No active isolations  Medications Medications - No data to display  Mobility walks

## 2017-12-31 NOTE — H&P (Signed)
History and Physical    Michael Ramos OZH:[id number] DOB: December 17, 1970 DOA: 12/31/2017  I have briefly reviewed the patient's prior medical records in [place]  PCP: Terald Sleeper, PA-C  Patient coming from: home  Chief Complaint: hip pain  HPI: Michael Ramos is a 47 y.o. male with medical history significant of autism, completely dependent, chronic systolic CHF, diabetes mellitus, who is being brought to the emergency room by family after being called by his orthopedic surgeon this morning.  He has been having left hip and knee pain for couple of weeks, his mother tells me that patient reported about 3 weeks ago that while walking heard a "pop" into his left hip.  He eventually had an MRI done which showed hip fracture and was directed by his orthopedic surgery to come to the ED for repair.  Patient does not really answer any my questions so history as per family who is at bedside.  They deny any recent issues with chest pain, palpitations, fluid overload.  He reports compliance with all his home medications.  No significant shortness of breath, he has some dyspnea on exertion which is chronic.  No fever or chills, no GI issues recently.  No falls  ED Course: The emergency room his vital signs are stable, he is afebrile, blood work remarkable for creatinine 1.3 which is at baseline otherwise labs are within normal limits.  Chest x-ray without edema or consolidation.  Orthopedic surgery consulted and we are asked to admit.  Review of Systems: As per HPI otherwise 10 point review of systems negative.   Past Medical History:  Diagnosis Date  . Allergy   . Autism   . CHF (congestive heart failure) ([place])    a. EF 20-25% by echo in 08/2016 b. repeat echo in 02/2017 showing EF 30-35% with cath showing normal cors.   . Diabetes mellitus without complication ([place])   . Obesity (BMI 30-39.9) 10/23/2017    Past Surgical History:  Procedure Laterality Date  . RIGHT/LEFT HEART CATH AND  CORONARY ANGIOGRAPHY N/A 03/06/2017   Procedure: RIGHT/LEFT HEART CATH AND CORONARY ANGIOGRAPHY;  Surgeon: Burnell Blanks, MD;  Location: [place] [facility];  Service: Cardiovascular;  Laterality: N/A;     reports that he has never smoked. He has never used smokeless tobacco. He reports that he does not drink alcohol or use drugs.  No Known Allergies  Family History  Problem Relation Age of Onset  . Diabetes Mother   . Cancer Father   . Diabetes Father     Prior to Admission medications   Medication Sig Start Date End Date Taking? Authorizing Provider  carvedilol (COREG) 25 MG tablet TAKE ONE TABLET BY MOUTH TWICE DAILY Patient taking differently: Take 25 mg by mouth 2 (two) times daily.  10/29/17  Yes BranchAlphonse Guild, MD  diclofenac sodium (VOLTAREN) 1 % GEL Apply 4 g topically 4 (four) times daily. 12/06/17  Yes Martin, Mary-Margaret, FNP  furosemide (LASIX) 40 MG tablet Take 40 mg by mouth daily.  01/04/17  Yes [provider]  glipiZIDE (GLUCOTROL) 5 MG tablet TAKE 1 TABLET TWICE DAILY BEFORE MEALS Patient taking differently: Take 5 mg by mouth 2 (two) times daily.  12/24/17  Yes Terald Sleeper, PA-C  hydrocortisone 2.5 % cream Apply 1 application topically 2 (two) times daily as needed (knee).   Yes [provider]  ibuprofen (ADVIL,MOTRIN) 800 MG tablet Take 800 mg by mouth every 8 (eight) hours as needed  for moderate pain.   Yes [provider]  loratadine (CLARITIN) 10 MG tablet TAKE ONE (1) TABLET EACH DAY Patient taking differently: Take 10 mg by mouth daily.  10/01/17  Yes Terald Sleeper, PA-C  metFORMIN (GLUCOPHAGE) 500 MG tablet Take 1 tablet (500 mg total) by mouth daily. Patient taking differently: Take 500 mg by mouth 2 (two) times daily.  07/05/17  Yes Terald Sleeper, PA-C  potassium chloride SA (KLOR-CON M20) 20 MEQ tablet Take 2 tablets (40 mEq total) by mouth daily. 08/30/17 12/31/17 Yes Branch, Alphonse Guild, MD  sacubitril-valsartan  (ENTRESTO) 49-51 MG Take 1 tablet by mouth 2 (two) times daily. 11/01/17  Yes Branch, Alphonse Guild, MD  hydrocortisone (ANUSOL-HC) 2.5 % rectal cream Place 1 application rectally 2 (two) times daily. Patient not taking: Reported on 12/31/2017 11/16/17   Terald Sleeper, PA-C    Physical Exam: Vitals:   12/31/17 1102 12/31/17 1105 12/31/17 1106  BP: (!) 136/94    Pulse: 81    Resp: 20    Temp:   (!) 97.5 F (36.4 C)  TempSrc:   Oral  SpO2: 94%    Weight:  134.7 kg   Height:  6\' 3"  (1.905 m)       Constitutional: NAD, calm, comfortable Eyes:  lids and conjunctivae normal ENMT: Mucous membranes are moist.  Neck: normal, supple, no masses, no thyromegaly Respiratory: clear to auscultation bilaterally, no wheezing, no crackles. Normal respiratory effort. No accessory muscle use.  Cardiovascular: Regular rate and rhythm, no murmurs / rubs / gallops. Trace lower extremity edema. 2+ pedal pulses.  Abdomen: no tenderness, no masses palpated. Bowel sounds positive.  Musculoskeletal: no clubbing / cyanosis. Normal muscle tone.  Skin: no rashes, lesions, ulcers. No induration Neurologic: CN 2-12 grossly intact. Strength 5/5 in all 4.    Labs on Admission: I have personally reviewed following labs and imaging studies  CBC: Recent Labs  Lab 12/31/17 1137  WBC 8.6  NEUTROABS 6.4  HGB 13.8  HCT 41.8  MCV 90.1  PLT 154   Basic Metabolic Panel: Recent Labs  Lab 12/31/17 1137  NA 141  K 4.8  CL 107  CO2 25  GLUCOSE 139*  BUN 17  CREATININE 1.31*  CALCIUM 9.1   GFR: Estimated Creatinine Clearance: 105.4 mL/min (A) (by C-G formula based on SCr of 1.31 mg/dL (H)). Liver Function Tests: No results for input(s): AST, ALT, ALKPHOS, BILITOT, PROT, ALBUMIN in the last 168 hours. No results for input(s): LIPASE, AMYLASE in the last 168 hours. No results for input(s): AMMONIA in the last 168 hours. Coagulation Profile: No results for input(s): INR, PROTIME in the last 168  hours. Cardiac Enzymes: No results for input(s): CKTOTAL, CKMB, CKMBINDEX, TROPONINI in the last 168 hours. BNP (last 3 results) No results for input(s): PROBNP in the last 8760 hours. HbA1C: No results for input(s): HGBA1C in the last 72 hours. CBG: No results for input(s): GLUCAP in the last 168 hours. Lipid Profile: No results for input(s): CHOL, HDL, LDLCALC, TRIG, CHOLHDL, LDLDIRECT in the last 72 hours. Thyroid Function Tests: No results for input(s): TSH, T4TOTAL, FREET4, T3FREE, THYROIDAB in the last 72 hours. Anemia Panel: No results for input(s): VITAMINB12, FOLATE, FERRITIN, TIBC, IRON, RETICCTPCT in the last 72 hours. Urine analysis: No results found for: COLORURINE, APPEARANCEUR, LABSPEC, PHURINE, GLUCOSEU, HGBUR, BILIRUBINUR, KETONESUR, PROTEINUR, UROBILINOGEN, NITRITE, LEUKOCYTESUR   Radiological Exams on Admission: Dg Chest 2 View  Result Date: 12/31/2017 CLINICAL DATA:  Preoperative evaluation for hip  surgery EXAM: CHEST - 2 VIEW COMPARISON:  Aug 18, 2016 FINDINGS: No edema or consolidation. Heart is mildly enlarged with pulmonary vascularity normal. No adenopathy. No bone lesions. No pneumothorax. IMPRESSION: Mild cardiac enlargement.  No edema or consolidation. Electronically Signed   By: Lowella Grip III M.D.   On: 12/31/2017 12:12    EKG: Independently reviewed. Sinus rhythm   Assessment/Plan Active Problems:   Hip fracture (HCC)   Left-sided hip fracture -Keep n.p.o., appreciatesurgery consultation, patient has known chronic CHF however this appears stable and compensated, no evidence of exacerbation or fluid overload, no chest pains at home or significant dyspnea on exertion.  Lyndel Safe perioperative risk is 4.7%  Chronic systolic CHF -Appears euvolemic, resume home medications except Entresto and Lasxix, will resume postoperatively if no hypotension -Admit to telemetry, DC telemetry within 24 hours if no events  Diabetes mellitus -Hold oral agents,  placed on sliding scale  Chronic kidney disease stage II -Creatinine appears at baseline, monitor postop  Autism -Stable, at baseline, family will be present at all times   DVT prophylaxis: SCDs  Code Status: Full code  Family Communication: multiple family members at bedside Disposition Plan: may need SNF on d/c Consults called: orthopedic surgery      Admission status: inpatient     At the time of admission, it appears that the appropriate admission status for this patient is INPATIENT. This is judged to be reasonable and necessary in order to provide the required high service intensity to ensure the patient's safety given the presenting symptoms, physical exam findings, and initial radiographic and laboratory data in the context of their chronic comorbidities. Current circumstances are hip fracture, and it is felt to place patient at high risk for further clinical deterioration threatening life, limb, or organ. Moreover, it is my clinical judgment that the patient will require inpatient hospital care spanning beyond 2 midnights from the point of admission and that early discharge would result in unnecessary risk of decompensation and readmission or threat to life, limb or bodily function.   Marzetta Board, MD [facility] Pager [phone]  If 7PM-7AM, please contact night-coverage [email or web address] Password [email or web address]  12/31/2017, 1:13 PM

## 2017-12-31 NOTE — Progress Notes (Signed)
Patient's family upset that MD has not been by to see patient or update on plan of care, was instructed when they were called to come to the hospital that patient would be having surgery this evening, Dr Alvan Dame was contacted and he called family on room phone to discuss plan of care, they are very unhappy about the miscommunication and lack of physician presence since patient was admitted, charge nurse made aware

## 2017-12-31 NOTE — ED Triage Notes (Signed)
Pt family states that pt had MRI done of Thursday last week due to bending over to get water out of refrigerator and had pop on left hip. Was called this morning to to come to [facility] for surgery due to hip fracture.

## 2017-12-31 NOTE — ED Provider Notes (Signed)
[facility] DEPT Provider Note   CSN: [id number] Arrival date & time: 12/31/17  1041     History   Chief Complaint Chief Complaint  Patient presents with  . Hip Injury  . needs surgery    HPI Michael Ramos is a 47 y.o. male.  The history is provided by the patient and medical records. No language interpreter was used.   Michael Ramos is a 47 y.o. male  with a PMH of autism, CHF, DM who presents to the Emergency Department after being called by his orthopedic surgeon this morning.  Patient has been struggling with left hip and knee pain for a couple of weeks now.  He was seen by Dr. Rolena Infante at [facility].  On Thursday, he had an MRI performed of his back, hip and knee per family at bedside.  They were called this morning by Dr. Rolena Infante who told them he fractured his left hip.  They were told to come to the [facility] long emergency department so that he could have surgery.  Patient does endorse pain to the left hip which is worse when he moves the leg.  Pain appears to radiate down to the knee.  Denies any numbness or tingling.   Level V caveat applies 2/2 autism.   Past Medical History:  Diagnosis Date  . Allergy   . Autism   . CHF (congestive heart failure) ([place])    a. EF 20-25% by echo in 08/2016 b. repeat echo in 02/2017 showing EF 30-35% with cath showing normal cors.   . Diabetes mellitus without complication ([place])   . Obesity (BMI 30-39.9) 10/23/2017    Patient Active Problem List   Diagnosis Date Noted  . Obesity (BMI 30-39.9) 10/23/2017  . Chronic systolic heart failure ([place])   . Non-ischemic cardiomyopathy ([place])   . Dilated cardiomyopathy ([place]) 08/28/2016  . Acute on chronic renal insufficiency 08/28/2016  . Essential hypertension 08/25/2016  . Acute systolic CHF (congestive heart failure) ([place]) 08/18/2016  . Shortness of breath 08/15/2016  . Type 2 diabetes mellitus without complication, without long-term current use of  insulin ([place]) 03/14/2016  . Chronic nonseasonal allergic rhinitis due to pollen 03/14/2016  . Autism 03/14/2016    Past Surgical History:  Procedure Laterality Date  . RIGHT/LEFT HEART CATH AND CORONARY ANGIOGRAPHY N/A 03/06/2017   Procedure: RIGHT/LEFT HEART CATH AND CORONARY ANGIOGRAPHY;  Surgeon: Burnell Blanks, MD;  Location: [facility];  Service: Cardiovascular;  Laterality: N/A;        Home Medications    Prior to Admission medications   Medication Sig Start Date End Date Taking? Authorizing Provider  carvedilol (COREG) 25 MG tablet TAKE ONE TABLET BY MOUTH TWICE DAILY Patient taking differently: Take 25 mg by mouth 2 (two) times daily.  10/29/17  Yes BranchAlphonse Guild, MD  diclofenac sodium (VOLTAREN) 1 % GEL Apply 4 g topically 4 (four) times daily. 12/06/17  Yes Martin, Mary-Margaret, FNP  furosemide (LASIX) 40 MG tablet Take 40 mg by mouth daily.  01/04/17  Yes [provider]  glipiZIDE (GLUCOTROL) 5 MG tablet TAKE 1 TABLET TWICE DAILY BEFORE MEALS Patient taking differently: Take 5 mg by mouth 2 (two) times daily.  12/24/17  Yes Terald Sleeper, PA-C  hydrocortisone 2.5 % cream Apply 1 application topically 2 (two) times daily as needed (knee).   Yes [provider]  ibuprofen (ADVIL,MOTRIN) 800 MG tablet Take 800 mg by mouth every 8 (eight) hours as needed  for moderate pain.   Yes [provider]  loratadine (CLARITIN) 10 MG tablet TAKE ONE (1) TABLET EACH DAY Patient taking differently: Take 10 mg by mouth daily.  10/01/17  Yes Terald Sleeper, PA-C  metFORMIN (GLUCOPHAGE) 500 MG tablet Take 1 tablet (500 mg total) by mouth daily. Patient taking differently: Take 500 mg by mouth 2 (two) times daily.  07/05/17  Yes Terald Sleeper, PA-C  potassium chloride SA (KLOR-CON M20) 20 MEQ tablet Take 2 tablets (40 mEq total) by mouth daily. 08/30/17 12/31/17 Yes Branch, Alphonse Guild, MD  sacubitril-valsartan (ENTRESTO) 49-51 MG Take 1 tablet by  mouth 2 (two) times daily. 11/01/17  Yes Branch, Alphonse Guild, MD  hydrocortisone (ANUSOL-HC) 2.5 % rectal cream Place 1 application rectally 2 (two) times daily. Patient not taking: Reported on 12/31/2017 11/16/17   Terald Sleeper, PA-C    Family History Family History  Problem Relation Age of Onset  . Diabetes Mother   . Cancer Father   . Diabetes Father     Social History Social History   Tobacco Use  . Smoking status: Never Smoker  . Smokeless tobacco: Never Used  Substance Use Topics  . Alcohol use: No  . Drug use: No     Allergies   Patient has no known allergies.   Review of Systems Review of Systems  Unable to perform ROS: Other (Autism)  Constitutional: Negative for fever.  Respiratory: Negative for shortness of breath.   Cardiovascular: Negative for chest pain.  Musculoskeletal: Positive for arthralgias, back pain and myalgias.  Neurological: Negative for numbness.     Physical Exam Updated Vital Signs BP (!) 136/94 (BP Location: Right Arm)   Pulse 81   Temp (!) 97.5 F (36.4 C) (Oral)   Resp 20   Ht 6\' 3"  (1.905 m)   Wt 134.7 kg   SpO2 94%   BMI 37.12 kg/m   Physical Exam  Constitutional: He is oriented to person, place, and time. He appears well-developed and well-nourished. No distress.  HENT:  Head: Normocephalic and atraumatic.  Cardiovascular: Normal rate, regular rhythm and normal heart sounds.  No murmur heard. Pulmonary/Chest: Effort normal and breath sounds normal. No respiratory distress.  Abdominal: Soft. He exhibits no distension. There is no tenderness.  Musculoskeletal:  Tenderness to palpation to left hip. Full ROM although painful. Bilateral lower extremities neurovascularly intact.  Neurological: He is alert and oriented to person, place, and time.  Skin: Skin is warm and dry.  Nursing note and vitals reviewed.    ED Treatments / Results  Labs (all labs ordered are listed, but only abnormal results are displayed) Labs  Reviewed  BASIC METABOLIC PANEL - Abnormal; Notable for the following components:      Result Value   Glucose, Bld 139 (*)    Creatinine, Ser 1.31 (*)    All other components within normal limits  CBC WITH DIFFERENTIAL/PLATELET    EKG EKG Interpretation  Date/Time:  Monday December 31 2017 11:57:32 EDT Ventricular Rate:  83 PR Interval:    QRS Duration: 72 QT Interval:  372 QTC Calculation: 438 R Axis:   10 Text Interpretation:  Sinus rhythm Low voltage, extremity and precordial leads No acute changes Confirmed by Varney Biles ([id number]) on 12/31/2017 12:01:50 PM   Radiology Dg Chest 2 View  Result Date: 12/31/2017 CLINICAL DATA:  Preoperative evaluation for hip surgery EXAM: CHEST - 2 VIEW COMPARISON:  Aug 18, 2016 FINDINGS: No edema or consolidation. Heart is mildly enlarged  with pulmonary vascularity normal. No adenopathy. No bone lesions. No pneumothorax. IMPRESSION: Mild cardiac enlargement.  No edema or consolidation. Electronically Signed   By: Lowella Grip III M.D.   On: 12/31/2017 12:12    Procedures Procedures (including critical care time)  Medications Ordered in ED Medications - No data to display   Initial Impression / Assessment and Plan / ED Course  I have reviewed the triage vital signs and the nursing notes.  Pertinent labs & imaging results that were available during my care of the patient were reviewed by me and considered in my medical decision making (see chart for details).    Michael Ramos is a 47 y.o. male who presents to ED for persistent left hip pain.  Seen by [facility] where MRI was performed on Thursday.  He received a call this morning stating that he had a subcapital femoral neck fracture on MRI and to come to emergency department for likely surgery.  11:43 AM - Spoke with orthopedics, Dr. Alvan Dame, who recommends keeping patient n.p.o. and admitting to medicine.  Hopefully, he will have surgery tonight.   Labs reviewed and  reassuring. EKG and CXR for pre-op obtained without acute findings. Hospitalist consulted who will admit.   Final Clinical Impressions(s) / ED Diagnoses   Final diagnoses:  Encounter for pre-operative cardiovascular clearance  Closed fracture of neck of left femur, initial encounter [facility])    ED Discharge Orders    None       Ward, Ozella Almond, PA-C 12/31/17 [place], MD 01/04/18 1526

## 2017-12-31 NOTE — ED Notes (Signed)
Patient transported to X-ray 

## 2018-01-01 ENCOUNTER — Encounter (HOSPITAL_COMMUNITY): Payer: Self-pay | Admitting: Anesthesiology

## 2018-01-01 ENCOUNTER — Encounter (HOSPITAL_COMMUNITY)
Admission: EM | Disposition: A | Discharge status: Home / Self Care | Payer: Self-pay | Source: Home / Self Care | Attending: Internal Medicine

## 2018-01-01 DIAGNOSIS — S72002A Fracture of unspecified part of neck of left femur, initial encounter for closed fracture: Secondary | ICD-10-CM

## 2018-01-01 DIAGNOSIS — N182 Chronic kidney disease, stage 2 (mild): Secondary | ICD-10-CM | POA: Diagnosis not present

## 2018-01-01 DIAGNOSIS — I5022 Chronic systolic (congestive) heart failure: Secondary | ICD-10-CM | POA: Diagnosis not present

## 2018-01-01 DIAGNOSIS — F840 Autistic disorder: Secondary | ICD-10-CM | POA: Diagnosis not present

## 2018-01-01 DIAGNOSIS — I130 Hypertensive heart and chronic kidney disease with heart failure and stage 1 through stage 4 chronic kidney disease, or unspecified chronic kidney disease: Secondary | ICD-10-CM | POA: Diagnosis not present

## 2018-01-01 DIAGNOSIS — M84452A Pathological fracture, left femur, initial encounter for fracture: Secondary | ICD-10-CM | POA: Diagnosis not present

## 2018-01-01 DIAGNOSIS — I420 Dilated cardiomyopathy: Secondary | ICD-10-CM | POA: Diagnosis not present

## 2018-01-01 LAB — COMPREHENSIVE METABOLIC PANEL
ALK PHOS: 29 U/L — AB (ref 38–126)
ALT: 21 U/L (ref 0–44)
ANION GAP: 9 (ref 5–15)
AST: 14 U/L — ABNORMAL LOW (ref 15–41)
Albumin: 3.3 g/dL — ABNORMAL LOW (ref 3.5–5.0)
BILIRUBIN TOTAL: 0.4 mg/dL (ref 0.3–1.2)
BUN: 20 mg/dL (ref 6–20)
CALCIUM: 9 mg/dL (ref 8.9–10.3)
CO2: 27 mmol/L (ref 22–32)
Chloride: 105 mmol/L (ref 98–111)
Creatinine, Ser: 1.46 mg/dL — ABNORMAL HIGH (ref 0.61–1.24)
GFR calc non Af Amer: 56 mL/min — ABNORMAL LOW (ref >60)
GLUCOSE: 131 mg/dL — AB (ref 70–99)
POTASSIUM: 4.5 mmol/L (ref 3.5–5.1)
Sodium: 141 mmol/L (ref 135–145)
TOTAL PROTEIN: 6.6 g/dL (ref 6.5–8.1)

## 2018-01-01 LAB — GLUCOSE, CAPILLARY
GLUCOSE-CAPILLARY: 118 mg/dL — AB (ref 70–99)
Glucose-Capillary: 262 mg/dL — ABNORMAL HIGH (ref 70–99)

## 2018-01-01 LAB — CBC
HEMATOCRIT: 42.1 % (ref 39.0–52.0)
HEMOGLOBIN: 13.7 g/dL (ref 13.0–17.0)
MCH: 29.5 pg (ref 26.0–34.0)
MCHC: 32.5 g/dL (ref 30.0–36.0)
MCV: 90.5 fL (ref 78.0–100.0)
Platelets: 200 10*3/uL (ref 150–400)
RBC: 4.65 MIL/uL (ref 4.22–5.81)
RDW: 13.3 % (ref 11.5–15.5)
WBC: 7.9 10*3/uL (ref 4.0–10.5)

## 2018-01-01 LAB — HIV ANTIBODY (ROUTINE TESTING W REFLEX): HIV SCREEN 4TH GENERATION: NONREACTIVE

## 2018-01-01 SURGERY — INSERTION, INTRAMEDULLARY ROD, FEMUR
Anesthesia: General | Laterality: Left

## 2018-01-01 MED ORDER — DICLOFENAC SODIUM 1 % TD GEL
4.0000 g | Freq: Four times a day (QID) | TRANSDERMAL | Status: DC
  Administered 2018-01-01: 4 g via TOPICAL
  Filled 2018-01-01: qty 100

## 2018-01-01 MED ORDER — ACETAMINOPHEN 325 MG PO TABS
650.0000 mg | ORAL_TABLET | Freq: Four times a day (QID) | ORAL | Status: DC | PRN
  Administered 2018-01-01: 650 mg via ORAL
  Filled 2018-01-01: qty 2

## 2018-01-01 NOTE — Progress Notes (Signed)
Patient ID: HAWKE VILLALPANDO, male   DOB: [birth year], 47 y.o.   MRN: [id number]  At initial evaluation patient sleeping and I had lengthy discussion with family and apologized for the lack of interaction and information they had received prior to my evaluation.  At follow up time patient was sitting on bedside getting ready to eat lunch (as they decided against surgery).  Seemed comfortable and WNL  Painful weight bearing NVI Pain with ROM left hip  Dx: Left subchondral insufficiency fracture with reactive femoral head and neck edema  Plan: Following my apologies we discussed the identified problem and treatment options.  Options included non surgical observation, limited weight bearing to surgical core decompression to attempt decreasing pain. Based on medical health and limited information on success at improving symptoms his family/caregivers feel they would prefer to not operate. Always can reassess at follow up depending on response PT eval with recs PWB LLE until further directed DME including wheelchair RTC in 2 weeks

## 2018-01-01 NOTE — Discharge Instructions (Signed)
[name],  You were in the hospital because of a hip fracture. The surgeon has recommended you to be partial weight bearing only. You will go home with a walker and wheelchair in addition to home health physical therapy. Please follow-up with him in two weeks.

## 2018-01-01 NOTE — Discharge Summary (Signed)
Physician Discharge Summary  DAVIAN Ramos BTD:[id number] DOB: December 17, 1970 DOA: 12/31/2017  PCP: Terald Sleeper, PA-C  Admit date: 12/31/2017 Discharge date: 01/01/2018  Admitted From: Home Disposition: Home  Recommendations for Outpatient Follow-up:  1. Follow up with Orthopedic surgery in 2 weeks 2. Partial weight bearing of left leg 3. Home with DME wheelchair, walker, 3 in 1 4. Please follow up on the following pending results: None  Home Health: PT Equipment/Devices: Wheelchair, walker, 3 in 1  Discharge Condition: Stable CODE STATUS: Full code Diet recommendation: Heart healthy   Brief/Interim Summary:  Admission HPI written by Caren Griffins, MD   Chief Complaint: hip pain  HPI: Michael Ramos is a 47 y.o. male with medical history significant of autism, completely dependent, chronic systolic CHF, diabetes mellitus, who is being brought to the emergency room by family after being called by his orthopedic surgeon this morning.  He has been having left hip and knee pain for couple of weeks, his mother tells me that patient reported about 3 weeks ago that while walking heard a "pop" into his left hip.  He eventually had an MRI done which showed hip fracture and was directed by his orthopedic surgery to come to the ED for repair.  Patient does not really answer any my questions so history as per family who is at bedside.  They deny any recent issues with chest pain, palpitations, fluid overload.  He reports compliance with all his home medications.  No significant shortness of breath, he has some dyspnea on exertion which is chronic.  No fever or chills, no GI issues recently.  No falls  ED Course: The emergency room his vital signs are stable, he is afebrile, blood work remarkable for creatinine 1.3 which is at baseline otherwise labs are within normal limits.  Chest x-ray without edema or consolidation.  Orthopedic surgery consulted and we are asked to  admit.   Hospital course:  Hip fracture Initial plan for surgery, however, surgery deemed too risky. Plan for conservative management per orthopedic surgery recommendations. Partial weight bearing. Home with equipment and physical therapy. Outpatient follow-up with orthopedic surgery.  Discharge Diagnoses:  Active Problems:   Hip fracture [facility])    Discharge Instructions  Discharge Instructions    Call MD for:  severe uncontrolled pain   Complete by:  As directed      Allergies as of 01/01/2018   No Known Allergies     Medication List    TAKE these medications   carvedilol 25 MG tablet Commonly known as:  COREG TAKE ONE TABLET BY MOUTH TWICE DAILY   diclofenac sodium 1 % Gel Commonly known as:  VOLTAREN Apply 4 g topically 4 (four) times daily.   furosemide 40 MG tablet Commonly known as:  LASIX Take 40 mg by mouth daily.   glipiZIDE 5 MG tablet Commonly known as:  GLUCOTROL TAKE 1 TABLET TWICE DAILY BEFORE MEALS What changed:  See the new instructions.   hydrocortisone 2.5 % cream Apply 1 application topically 2 (two) times daily as needed (knee).   hydrocortisone 2.5 % rectal cream Commonly known as:  ANUSOL-HC Place 1 application rectally 2 (two) times daily.   ibuprofen 800 MG tablet Commonly known as:  ADVIL,MOTRIN Take 800 mg by mouth every 8 (eight) hours as needed for moderate pain.   loratadine 10 MG tablet Commonly known as:  CLARITIN TAKE ONE (1) TABLET EACH DAY What changed:  See the new instructions.   metFORMIN  500 MG tablet Commonly known as:  GLUCOPHAGE Take 1 tablet (500 mg total) by mouth daily. What changed:  when to take this   potassium chloride SA 20 MEQ tablet Commonly known as:  K-DUR,KLOR-CON Take 2 tablets (40 mEq total) by mouth daily.   sacubitril-valsartan 49-51 MG Commonly known as:  ENTRESTO Take 1 tablet by mouth 2 (two) times daily.            Durable Medical Equipment  (From admission, onward)          Start     Ordered   01/01/18 1330  For home use only DME standard manual wheelchair with seat cushion  Once    Comments:  Patient suffers from left hip fracture which impairs their ability to perform daily activities like bathing, dressing, feeding, grooming and toileting in the home.  A cane, crutch or walker will not resolve  issue with performing activities of daily living. A wheelchair will allow patient to safely perform daily activities. Patient can safely propel the wheelchair in the home or has a caregiver who can provide assistance.  Accessories: elevating leg rests (ELRs), wheel locks, extensions and anti-tippers.   01/01/18 1330   01/01/18 1307  For home use only DME Walker rolling  Once    Question:  Patient needs a walker to treat with the following condition  Answer:  Hip fracture ([place])   01/01/18 1307   01/01/18 1307  For home use only DME 3 n 1  Once     01/01/18 1307         Follow-up Information    [facility].   Specialty:  Home Health Services Why:  For home health services  Contact information: [place] [address] [phone]        Paralee Cancel, MD. Schedule an appointment as soon as possible for a visit in 2 week(s).   Specialty:  Orthopedic Surgery Contact information: [address] [place] [address] [phone]          No Known Allergies  Consultations:  Orthopedic surgery   Procedures/Studies: Dg Chest 2 View  Result Date: 12/31/2017 CLINICAL DATA:  Preoperative evaluation for hip surgery EXAM: CHEST - 2 VIEW COMPARISON:  Aug 18, 2016 FINDINGS: No edema or consolidation. Heart is mildly enlarged with pulmonary vascularity normal. No adenopathy. No bone lesions. No pneumothorax. IMPRESSION: Mild cardiac enlargement.  No edema or consolidation. Electronically Signed   By: Lowella Grip III M.D.   On: 12/31/2017 12:12      Subjective: Mild leg pain.  Discharge  Exam: Vitals:   12/31/17 2104 01/01/18 0457  BP: (!) 149/97 (!) 134/93  Pulse: 82 75  Resp:  17  Temp: 98 F (36.7 C) 98 F (36.7 C)  SpO2: 96% 100%   Vitals:   12/31/17 1404 12/31/17 1603 12/31/17 2104 01/01/18 0457  BP: (!) 141/94 (!) 132/97 (!) 149/97 (!) 134/93  Pulse: 80 80 82 75  Resp: 17 18  17   Temp: (!) 97.5 F (36.4 C) 97.8 F (36.6 C) 98 F (36.7 C) 98 F (36.7 C)  TempSrc: Oral Oral Oral Oral  SpO2: 98% 96% 96% 100%  Weight: 134.1 kg     Height: 6\' 3"  (1.905 m)       General: Pt is alert, awake, not in acute distress Cardiovascular: RRR, S1/S2 +, no rubs, no gallops Respiratory: CTA bilaterally, no wheezing, no rhonchi Abdominal: Soft, NT, ND, bowel sounds +  Extremities: no edema, no cyanosis. Mild tenderness over left thigh    The results of significant diagnostics from this hospitalization (including imaging, microbiology, ancillary and laboratory) are listed below for reference.     Microbiology: Recent Results (from the past 240 hour(s))  MRSA PCR Screening     Status: Abnormal   Collection Time: 12/31/17  2:15 PM  Result Value Ref Range Status   MRSA by PCR POSITIVE (A) NEGATIVE Final    Comment:        The GeneXpert MRSA Assay (FDA approved for NASAL specimens only), is one component of a comprehensive MRSA colonization surveillance program. It is not intended to diagnose MRSA infection nor to guide or monitor treatment for MRSA infections. RESULT CALLED TO, READ BACK BY AND VERIFIED WITH: K.GURLEY,RN 12/31/17 @1930  BY V.WILKINS Performed at [facility], [place] [address], [place], [place] [address]      Labs: BNP (last 3 results) No results for input(s): BNP in the last 8760 hours. Basic Metabolic Panel: Recent Labs  Lab 12/31/17 1137 01/01/18 0348  NA 141 141  K 4.8 4.5  CL 107 105  CO2 25 27  GLUCOSE 139* 131*  BUN 17 20  CREATININE 1.31* 1.46*  CALCIUM 9.1 9.0   Liver Function Tests: Recent Labs  Lab  01/01/18 0348  AST 14*  ALT 21  ALKPHOS 29*  BILITOT 0.4  PROT 6.6  ALBUMIN 3.3*   No results for input(s): LIPASE, AMYLASE in the last 168 hours. No results for input(s): AMMONIA in the last 168 hours. CBC: Recent Labs  Lab 12/31/17 1137 01/01/18 0348  WBC 8.6 7.9  NEUTROABS 6.4  --   HGB 13.8 13.7  HCT 41.8 42.1  MCV 90.1 90.5  PLT 198 200   Cardiac Enzymes: No results for input(s): CKTOTAL, CKMB, CKMBINDEX, TROPONINI in the last 168 hours. BNP: Invalid input(s): POCBNP CBG: Recent Labs  Lab 12/31/17 1650 12/31/17 2107 01/01/18 0806 01/01/18 1306  GLUCAP 118* 188* 118* 262*   D-Dimer No results for input(s): DDIMER in the last 72 hours. Hgb A1c No results for input(s): HGBA1C in the last 72 hours. Lipid Profile No results for input(s): CHOL, HDL, LDLCALC, TRIG, CHOLHDL, LDLDIRECT in the last 72 hours. Thyroid function studies No results for input(s): TSH, T4TOTAL, T3FREE, THYROIDAB in the last 72 hours.  Invalid input(s): FREET3 Anemia work up No results for input(s): VITAMINB12, FOLATE, FERRITIN, TIBC, IRON, RETICCTPCT in the last 72 hours. Urinalysis No results found for: COLORURINE, APPEARANCEUR, [place], [place], [place], [place], [place], [place], PROTEINUR, UROBILINOGEN, NITRITE, LEUKOCYTESUR Sepsis Labs Invalid input(s): PROCALCITONIN,  WBC,  LACTICIDVEN Microbiology Recent Results (from the past 240 hour(s))  MRSA PCR Screening     Status: Abnormal   Collection Time: 12/31/17  2:15 PM  Result Value Ref Range Status   MRSA by PCR POSITIVE (A) NEGATIVE Final    Comment:        The GeneXpert MRSA Assay (FDA approved for NASAL specimens only), is one component of a comprehensive MRSA colonization surveillance program. It is not intended to diagnose MRSA infection nor to guide or monitor treatment for MRSA infections. RESULT CALLED TO, READ BACK BY AND VERIFIED WITH: K.GURLEY,RN 12/31/17 @1930  BY V.WILKINS Performed at [facility], [place] [address], [place], [place] [address]     SIGNED:   Cordelia Poche, MD [facility] 01/01/2018, 1:31 PM

## 2018-01-01 NOTE — Evaluation (Signed)
Physical Therapy Evaluation Patient Details Name: Michael Ramos MRN: [id number] DOB: March 06, 1971 Today's Date: 01/01/2018   History of Present Illness  Pt is a 47 YO male with subcapital femoral neck fracture sustained approximately 3 weeks ago. Pt bent down to reach something in the refrigerator and pt reports hearing a "pop". Pt non-op for fracture at this time. PMH includes autism, CHF, dilated cardiomyopathy, HTN, CKD II, R/L heart cath and coronary angiography.  Clinical Impression   At start presents with mild L hip pain not exacerbated by mobility, some difficulty ambulating and stair navigation, and decreased tolerance for ambulation. Pt ambulated hallway distances with RW with min guard assist, favoring LLE. Pt also performed stair training with adequate proficiency. Family informed of guarding techniques to use with stair navigation and sequencing. At time of eval, pt's WB status was not clear to PT or RN. Clarified WB status with Dr. Alvan Dame after the session, and pt is <50% WB on LLE. PT revisited pt and family to address WB status and reinforce WB status with mobility. PT demonstrated ambulation with <50% WB status. Family and pt feel comfortable with WB status at this time. Pt with no further PT needs, suspected to d/c home today when pt has all equipment.     Follow Up Recommendations Supervision for mobility/OOB;Home health PT    Equipment Recommendations  Rolling walker with 5" wheels;Cane    Recommendations for Other Services       Precautions / Restrictions Precautions Precautions: Fall Restrictions Weight Bearing Restrictions: Yes Other Position/Activity Restrictions: <50% WB on LLE, PT and RN unsure of WB restrictions during session because not in orders or notes. After session, PT went back upstairs to discuss WB status and demonstrate <50% WB.       Mobility  Bed Mobility               General bed mobility comments: Pt sitting EOB upon PT arrival to room    Transfers Overall transfer level: Needs assistance Equipment used: Rolling walker (2 wheeled) Transfers: Sit to/from Stand Sit to Stand: Min guard         General transfer comment: Verbal cuing for hand placement x2, min guard for safety.   Ambulation/Gait Ambulation/Gait assistance: Min guard Gait Distance (Feet): 200 Feet Assistive device: Rolling walker (2 wheeled) Gait Pattern/deviations: Step-to pattern;Decreased stride length;Decreased weight shift to left;Decreased stance time - left;Trunk flexed Gait velocity: decr   General Gait Details: Min guard for safety. Verbal cuing for sequencing. Pt favoring LLE during ambulation. Attempted straight cane for short distance, but PT told pt and family that RW more safe initially and to only use cane and one rail for steps. RW also more appropriate for newly issued WB status of <50%.    Stairs Stairs: Yes Stairs assistance: Min guard;Min assist Stair Management: One rail Left;With cane Number of Stairs: 6 General stair comments: Pt min assist initially for sequencing and steadying on stairs. Min guard for safety after first two steps. Pt relying on cane and railing for support. L rail going up stairs, L rail going down stairs because pt has handrail bilat.   Wheelchair Mobility    Modified Rankin (Stroke Patients Only)       Balance Overall balance assessment: Mild deficits observed, not formally tested  Pertinent Vitals/Pain Pain Assessment: 0-10 Pain Score: 2  Pain Location: L hip  Pain Descriptors / Indicators: Sore;Aching;Guarding Pain Intervention(s): Limited activity within patient's tolerance;Repositioned;Monitored during session    Home Living Family/patient expects to be discharged to:: Private residence Living Arrangements: Parent(sister, mother (sister's house)) Available Help at Discharge: Family;Available PRN/intermittently Type of Home:  House Home Access: Stairs to enter Entrance Stairs-Rails: Right;Left;Can reach both Entrance Stairs-Number of Steps: 5 Home Layout: One level Home Equipment: Walker - 2 wheels(family states that they think the walker is too short for him and he needs a new one) )      Prior Function Level of Independence: Independent         Comments: gets some help with cooking and cleaning, but is able to do it. Mother just does it for him.     Hand Dominance   Dominant Hand: Right    Extremity/Trunk Assessment   Upper Extremity Assessment Upper Extremity Assessment: Overall WFL for tasks assessed    Lower Extremity Assessment Lower Extremity Assessment: LLE deficits/detail;RLE deficits/detail RLE Deficits / Details: hip abduction/adduction, knee extension grossly 4/5.  LLE Deficits / Details: hip abduction/adduction, knee extension screened at least 3/5, limited by pain  LLE: Unable to fully assess due to pain LLE Sensation: WNL    Cervical / Trunk Assessment Cervical / Trunk Assessment: Normal  Communication   Communication: No difficulties  Cognition Arousal/Alertness: Awake/alert Behavior During Therapy: WFL for tasks assessed/performed Overall Cognitive Status: History of cognitive impairments - at baseline                                 General Comments: Pt with PMH of autism. Increased processing time for one and multi-step commands, increased time for initiation, repetition of verbal and tactile cuing.       General Comments      Exercises     Assessment/Plan    PT Assessment All further PT needs can be met in the next venue of care;Patent does not need any further PT services  PT Problem List         PT Treatment Interventions      PT Goals (Current goals can be found in the Care Plan section)  Acute Rehab PT Goals Patient Stated Goal: go home safely  PT Goal Formulation: With patient/family Time For Goal Achievement: 01/01/18 Potential to  Achieve Goals: Good    Frequency     Barriers to discharge        Co-evaluation               AM-PAC PT "6 Clicks" Daily Activity  Outcome Measure Difficulty turning over in bed (including adjusting bedclothes, sheets and blankets)?: A Little Difficulty moving from lying on back to sitting on the side of the bed? : A Little Difficulty sitting down on and standing up from a chair with arms (e.g., wheelchair, bedside commode, etc,.)?: Unable Help needed moving to and from a bed to chair (including a wheelchair)?: A Little Help needed walking in hospital room?: A Little Help needed climbing 3-5 steps with a railing? : A Little 6 Click Score: 16    End of Session Equipment Utilized During Treatment: Gait belt Activity Tolerance: Patient tolerated treatment well;No increased pain Patient left: in bed;with call bell/phone within reach;with family/visitor present Nurse Communication: Mobility status;Other (comment)(WB status of <50% on LLE ) PT Visit Diagnosis: Unsteadiness on feet (R26.81);Difficulty in walking, not elsewhere  classified (R26.2)    Time: 8159-4707 PT Time Calculation (min) (ACUTE ONLY): 37 min   Charges:   PT Evaluation $PT Eval Low Complexity: 1 Low PT Treatments $Gait Training: 8-22 mins      Julien Girt, PT Acute Rehabilitation Services Pager [phone]  Office [phone]   Makaria Poarch D Chi Garlow 01/01/2018, 1:32 PM

## 2018-01-01 NOTE — Progress Notes (Signed)
Went over patient discharge papers with his mother who is the legal guardian ,she verbalized understanding , knows which pharmacy to get patient's medication from. Discontinued iv access.

## 2018-01-01 NOTE — Care Management (Signed)
This CM was alerted by staff RN that pt had DME needs for home. This CM met with pt and family at bedside. Pt to DC home with home health PT. [facility] chosen when choice offered. Request also made for 3in1 and RW. [facility] rep alerted of home health referral and need for DME. Will need MD orders for DC. Marney Doctor RN,BSN [phone]

## 2018-01-02 ENCOUNTER — Telehealth: Payer: Self-pay | Admitting: Physician Assistant

## 2018-01-02 DIAGNOSIS — E669 Obesity, unspecified: Secondary | ICD-10-CM | POA: Diagnosis not present

## 2018-01-02 DIAGNOSIS — F840 Autistic disorder: Secondary | ICD-10-CM | POA: Diagnosis not present

## 2018-01-02 DIAGNOSIS — N182 Chronic kidney disease, stage 2 (mild): Secondary | ICD-10-CM | POA: Diagnosis not present

## 2018-01-02 DIAGNOSIS — Z7984 Long term (current) use of oral hypoglycemic drugs: Secondary | ICD-10-CM | POA: Diagnosis not present

## 2018-01-02 DIAGNOSIS — E1122 Type 2 diabetes mellitus with diabetic chronic kidney disease: Secondary | ICD-10-CM | POA: Diagnosis not present

## 2018-01-02 DIAGNOSIS — I5022 Chronic systolic (congestive) heart failure: Secondary | ICD-10-CM | POA: Diagnosis not present

## 2018-01-02 DIAGNOSIS — S72012D Unspecified intracapsular fracture of left femur, subsequent encounter for closed fracture with routine healing: Secondary | ICD-10-CM | POA: Diagnosis not present

## 2018-01-02 DIAGNOSIS — I110 Hypertensive heart disease with heart failure: Secondary | ICD-10-CM | POA: Diagnosis not present

## 2018-01-02 DIAGNOSIS — Z6836 Body mass index (BMI) 36.0-36.9, adult: Secondary | ICD-10-CM | POA: Diagnosis not present

## 2018-01-03 DIAGNOSIS — I5022 Chronic systolic (congestive) heart failure: Secondary | ICD-10-CM | POA: Diagnosis not present

## 2018-01-03 DIAGNOSIS — I110 Hypertensive heart disease with heart failure: Secondary | ICD-10-CM | POA: Diagnosis not present

## 2018-01-03 DIAGNOSIS — N182 Chronic kidney disease, stage 2 (mild): Secondary | ICD-10-CM | POA: Diagnosis not present

## 2018-01-03 DIAGNOSIS — E1122 Type 2 diabetes mellitus with diabetic chronic kidney disease: Secondary | ICD-10-CM | POA: Diagnosis not present

## 2018-01-03 DIAGNOSIS — S72012D Unspecified intracapsular fracture of left femur, subsequent encounter for closed fracture with routine healing: Secondary | ICD-10-CM | POA: Diagnosis not present

## 2018-01-03 DIAGNOSIS — F840 Autistic disorder: Secondary | ICD-10-CM | POA: Diagnosis not present

## 2018-01-07 DIAGNOSIS — S72012D Unspecified intracapsular fracture of left femur, subsequent encounter for closed fracture with routine healing: Secondary | ICD-10-CM | POA: Diagnosis not present

## 2018-01-07 DIAGNOSIS — E1122 Type 2 diabetes mellitus with diabetic chronic kidney disease: Secondary | ICD-10-CM | POA: Diagnosis not present

## 2018-01-07 DIAGNOSIS — I5022 Chronic systolic (congestive) heart failure: Secondary | ICD-10-CM | POA: Diagnosis not present

## 2018-01-07 DIAGNOSIS — F840 Autistic disorder: Secondary | ICD-10-CM | POA: Diagnosis not present

## 2018-01-07 DIAGNOSIS — N182 Chronic kidney disease, stage 2 (mild): Secondary | ICD-10-CM | POA: Diagnosis not present

## 2018-01-07 DIAGNOSIS — I110 Hypertensive heart disease with heart failure: Secondary | ICD-10-CM | POA: Diagnosis not present

## 2018-01-08 NOTE — Telephone Encounter (Signed)
Patients sister said that they went ahead and started him back on his previous medications.

## 2018-01-10 DIAGNOSIS — S72012D Unspecified intracapsular fracture of left femur, subsequent encounter for closed fracture with routine healing: Secondary | ICD-10-CM | POA: Diagnosis not present

## 2018-01-10 DIAGNOSIS — E1122 Type 2 diabetes mellitus with diabetic chronic kidney disease: Secondary | ICD-10-CM | POA: Diagnosis not present

## 2018-01-10 DIAGNOSIS — I5022 Chronic systolic (congestive) heart failure: Secondary | ICD-10-CM | POA: Diagnosis not present

## 2018-01-10 DIAGNOSIS — F840 Autistic disorder: Secondary | ICD-10-CM | POA: Diagnosis not present

## 2018-01-10 DIAGNOSIS — I110 Hypertensive heart disease with heart failure: Secondary | ICD-10-CM | POA: Diagnosis not present

## 2018-01-10 DIAGNOSIS — N182 Chronic kidney disease, stage 2 (mild): Secondary | ICD-10-CM | POA: Diagnosis not present

## 2018-01-16 DIAGNOSIS — M25552 Pain in left hip: Secondary | ICD-10-CM | POA: Diagnosis not present

## 2018-01-16 DIAGNOSIS — B078 Other viral warts: Secondary | ICD-10-CM | POA: Diagnosis not present

## 2018-01-16 DIAGNOSIS — L980 Pyogenic granuloma: Secondary | ICD-10-CM | POA: Diagnosis not present

## 2018-01-17 DIAGNOSIS — N182 Chronic kidney disease, stage 2 (mild): Secondary | ICD-10-CM | POA: Diagnosis not present

## 2018-01-17 DIAGNOSIS — E1122 Type 2 diabetes mellitus with diabetic chronic kidney disease: Secondary | ICD-10-CM | POA: Diagnosis not present

## 2018-01-17 DIAGNOSIS — S72012D Unspecified intracapsular fracture of left femur, subsequent encounter for closed fracture with routine healing: Secondary | ICD-10-CM | POA: Diagnosis not present

## 2018-01-17 DIAGNOSIS — F840 Autistic disorder: Secondary | ICD-10-CM | POA: Diagnosis not present

## 2018-01-17 DIAGNOSIS — I5022 Chronic systolic (congestive) heart failure: Secondary | ICD-10-CM | POA: Diagnosis not present

## 2018-01-17 DIAGNOSIS — I110 Hypertensive heart disease with heart failure: Secondary | ICD-10-CM | POA: Diagnosis not present

## 2018-01-31 DIAGNOSIS — Z23 Encounter for immunization: Secondary | ICD-10-CM | POA: Diagnosis not present

## 2018-02-01 ENCOUNTER — Other Ambulatory Visit: Payer: Self-pay | Admitting: Cardiology

## 2018-02-01 ENCOUNTER — Encounter: Payer: Self-pay | Admitting: Cardiology

## 2018-02-01 ENCOUNTER — Ambulatory Visit (INDEPENDENT_AMBULATORY_CARE_PROVIDER_SITE_OTHER): Payer: Medicare Other | Admitting: Cardiology

## 2018-02-01 VITALS — BP 118/72 | HR 87 | Ht 75.0 in | Wt 294.4 lb

## 2018-02-01 DIAGNOSIS — I5022 Chronic systolic (congestive) heart failure: Secondary | ICD-10-CM

## 2018-02-01 NOTE — Patient Instructions (Signed)

## 2018-02-01 NOTE — Progress Notes (Signed)
Clinical Summary Mr. Tall is a 47 y.o.male seen today for follow up of the following medical problems.   1. Chronic systolic HF - new diagnosis during 08/2016 admission with fluid overload - 02/2017 cath no significant CAD.  07/2017 echo LVEF 35-40%, grade II diastolic dysfunction - no SOB or DOE - compliant with meds   2. OSA screen - seen by Dr Radford Pax. Plans for overnight O2 sat monitor, if abnormal possible sleep study. Discussed his very poor sleep hygiene.   3. Hip fracture - conservative management per ortho    Past Medical History:  Diagnosis Date  . Allergy   . Autism   . CHF (congestive heart failure) ([place])    a. EF 20-25% by echo in 08/2016 b. repeat echo in 02/2017 showing EF 30-35% with cath showing normal cors.   . Diabetes mellitus without complication ([place])   . Obesity (BMI 30-39.9) 10/23/2017     No Known Allergies   Current Outpatient Medications  Medication Sig Dispense Refill  . carvedilol (COREG) 25 MG tablet TAKE ONE TABLET BY MOUTH TWICE DAILY (Patient taking differently: Take 25 mg by mouth 2 (two) times daily. ) 180 tablet 3  . diclofenac sodium (VOLTAREN) 1 % GEL Apply 4 g topically 4 (four) times daily. 5 Tube 2  . furosemide (LASIX) 40 MG tablet Take 40 mg by mouth daily.     Marland Kitchen glipiZIDE (GLUCOTROL) 5 MG tablet TAKE 1 TABLET TWICE DAILY BEFORE MEALS (Patient taking differently: Take 5 mg by mouth 2 (two) times daily. ) 180 tablet 1  . hydrocortisone (ANUSOL-HC) 2.5 % rectal cream Place 1 application rectally 2 (two) times daily. (Patient not taking: Reported on 12/31/2017) 60 g 0  . hydrocortisone 2.5 % cream Apply 1 application topically 2 (two) times daily as needed (knee).    Marland Kitchen ibuprofen (ADVIL,MOTRIN) 800 MG tablet Take 800 mg by mouth every 8 (eight) hours as needed for moderate pain.    Marland Kitchen loratadine (CLARITIN) 10 MG tablet TAKE ONE (1) TABLET EACH DAY (Patient taking differently: Take 10 mg by mouth daily. ) 90 tablet 1  .  metFORMIN (GLUCOPHAGE) 500 MG tablet Take 1 tablet (500 mg total) by mouth daily. (Patient taking differently: Take 500 mg by mouth 2 (two) times daily. ) 90 tablet 3  . potassium chloride SA (KLOR-CON M20) 20 MEQ tablet Take 2 tablets (40 mEq total) by mouth daily. 180 tablet 3  . sacubitril-valsartan (ENTRESTO) 49-51 MG Take 1 tablet by mouth 2 (two) times daily. 60 tablet 3   No current facility-administered medications for this visit.      Past Surgical History:  Procedure Laterality Date  . RIGHT/LEFT HEART CATH AND CORONARY ANGIOGRAPHY N/A 03/06/2017   Procedure: RIGHT/LEFT HEART CATH AND CORONARY ANGIOGRAPHY;  Surgeon: Burnell Blanks, MD;  Location: [facility];  Service: Cardiovascular;  Laterality: N/A;     No Known Allergies    Family History  Problem Relation Age of Onset  . Diabetes Mother   . Cancer Father   . Diabetes Father      Social History Mr. Labella reports that he has never smoked. He has never used smokeless tobacco. Mr. Creamer reports that he does not drink alcohol.   Review of Systems CONSTITUTIONAL: No weight loss, fever, chills, weakness or fatigue.  HEENT: Eyes: No visual loss, blurred vision, double vision or yellow sclerae.No hearing loss, sneezing, congestion, runny nose or sore throat.  SKIN: No rash or itching.  CARDIOVASCULAR: per hpi RESPIRATORY: No shortness of breath, cough or sputum.  GASTROINTESTINAL: No anorexia, nausea, vomiting or diarrhea. No abdominal pain or blood.  GENITOURINARY: No burning on urination, no polyuria NEUROLOGICAL: No headache, dizziness, syncope, paralysis, ataxia, numbness or tingling in the extremities. No change in bowel or bladder control.  MUSCULOSKELETAL: No muscle, back pain, joint pain or stiffness.  LYMPHATICS: No enlarged nodes. No history of splenectomy.  PSYCHIATRIC: No history of depression or anxiety.  ENDOCRINOLOGIC: No reports of sweating, cold or heat intolerance. No polyuria or  polydipsia.  Marland Kitchen   Physical Examination Vitals:   02/01/18 1120  BP: 118/72  Pulse: 87  SpO2: 97%   Vitals:   02/01/18 1120  Weight: 294 lb 6.4 oz (133.5 kg)  Height: 6\' 3"  (1.905 m)    Gen: resting comfortably, no acute distress HEENT: no scleral icterus, pupils equal round and reactive, no palptable cervical adenopathy,  CV: RRR, no m/r/g, no jvd Resp: Clear to auscultation bilaterally GI: abdomen is soft, non-tender, non-distended, normal bowel sounds, no hepatosplenomegaly MSK: extremities are warm, no edema.  Skin: warm, no rash Neuro:  no focal deficits Psych: appropriate affect   Diagnostic Studies 02/2017 cath  Hemodynamic findings consistent with mild pulmonary hypertension.  1. No angiographic evidence of CAD 2. Non-ischemic cardiomyopathy  Recommendations: medical management of cardiomyopathy and CHF.   02/2017 echo Study Conclusions  - Left ventricle: The cavity size was mildly dilated. Wall thickness was normal. Systolic function was moderately to severely reduced. The estimated ejection fraction was in the range of 30% to 35%. Diffuse hypokinesis. Diastolic dysfunction, grade indeterminate. Indeterminate filling pressures. - Mitral valve: There was mild regurgitation.   07/2017 echo Study Conclusions  - Left ventricle: The cavity size was mildly dilated. Wall thickness was normal. Systolic function was moderately reduced. The estimated ejection fraction was in the range of 35% to 40%. Diffuse hypokinesis. Features are consistent with a pseudonormal left ventricular filling pattern, with concomitant abnormal relaxation and increased filling pressure (grade 2 diastolic dysfunction). - Mitral valve: There was mild regurgitation.     Assessment and Plan  1. Chronic systolic HF/ NICM - LVEF 16-10%, NYHA II. He will not require and ICD - medical therapy limited by his renal function with mild uptredn in Cr  recently, would not be more aggressive with entresto dosing at this time, would not plan for aldactone at this time.  - no symptoms   Will touch base with Dr Radford Pax regarding overnight oximetry testing and whether a formal sleep study is indicated.    F/u 6 months   Arnoldo Lenis, M.D.,

## 2018-02-06 ENCOUNTER — Telehealth: Payer: Self-pay | Admitting: *Deleted

## 2018-02-06 NOTE — Telephone Encounter (Signed)
-----   Message from Sueanne Margarita, MD sent at 02/03/2018  3:19 PM EDT ----- Gae Bon  Please get results of overnight pulse ox  Traci ----- Message ----- From: Arnoldo Lenis, MD Sent: 02/01/2018  12:05 PM EDT To: Sueanne Margarita, MD  Eyvonne Mechanic, for this mutual patient did you ever get the results of the overnight oximetry. I don't see the results in epic and the family reports he did complete it, I think you were going to consider a sleep study if it came back abnormal   Zandra Abts MD

## 2018-02-06 NOTE — Telephone Encounter (Signed)
Reached out to [facility] and was informed that the patient did not wear the pulse Ox long enough so the test has been rescheduled as of today. [facility] will fax the test over when completed.

## 2018-02-13 DIAGNOSIS — R0902 Hypoxemia: Secondary | ICD-10-CM | POA: Diagnosis not present

## 2018-02-13 DIAGNOSIS — J449 Chronic obstructive pulmonary disease, unspecified: Secondary | ICD-10-CM | POA: Diagnosis not present

## 2018-02-28 ENCOUNTER — Telehealth: Payer: Self-pay | Admitting: Physician Assistant

## 2018-03-25 ENCOUNTER — Other Ambulatory Visit: Payer: Self-pay | Admitting: Cardiology

## 2018-03-29 ENCOUNTER — Other Ambulatory Visit: Payer: Self-pay

## 2018-04-01 ENCOUNTER — Telehealth: Payer: Self-pay

## 2018-04-01 NOTE — Telephone Encounter (Signed)
-----   Message from Elberta Fortis sent at 04/01/2018  9:41 AM EST -----  ----- Message ----- From: Sueanne Margarita, MD Sent: 03/29/2018   7:55 PM EST To: Elberta Fortis  Please let patient's mom know that patient has nocturnal hypoxemia and recommend in lab split night sleep study with one family member present  Fransico Him ----- Message ----- From: Elberta Fortis Sent: 03/29/2018   3:00 PM EST To: Sueanne Margarita, MD

## 2018-04-01 NOTE — Telephone Encounter (Signed)
Attempted to call, phone did not give voicemail option. Will try again later.

## 2018-04-02 ENCOUNTER — Telehealth: Payer: Self-pay | Admitting: *Deleted

## 2018-04-02 DIAGNOSIS — G4730 Sleep apnea, unspecified: Secondary | ICD-10-CM

## 2018-04-02 NOTE — Telephone Encounter (Signed)
-----   Message from Sueanne Margarita, MD sent at 03/29/2018  7:55 PM EST ----- Please let patient's mom know that patient's oxygen drops  low during sleep and recommend overnight split night sleep study to assess for sleep apnea with 1 family member present  Traci TUrner ----- Message ----- From: Elberta Fortis Sent: 03/29/2018   3:00 PM EST To: Sueanne Margarita, MD

## 2018-04-05 NOTE — Telephone Encounter (Signed)
Spoke with the patient's mother she stated the patient would not tolerate a mask and she does not believe the sleep study would work. Sending to Dr. Radford Pax for recommendations.

## 2018-04-05 NOTE — Telephone Encounter (Signed)
Please see if she thinks he would tolerate a home sleep study

## 2018-04-11 NOTE — Telephone Encounter (Signed)
Patients mom says she doubts her son would be able to do the split night. Her son is autistic and does not like change and he hates hospitals. They are willing to try however.

## 2018-04-11 NOTE — Telephone Encounter (Signed)
Please let patient's mom know that patient's oxygen drops  low during sleep and recommend overnight split night sleep study to assess for sleep apnea with 1 family member present  Patients mom says she doubts her son would be able to do the split night. Her son is autistic and does not like change and he hates hospitals. They are willing to try however.      Documentation     Turner, Eber Hong, MD  You 4 hours ago (1:03 PM)     Please let Dr. Harl Bowie know

## 2018-04-11 NOTE — Telephone Encounter (Signed)
Please let Dr. Harl Bowie know

## 2018-04-22 ENCOUNTER — Other Ambulatory Visit: Payer: Self-pay | Admitting: Physician Assistant

## 2018-04-22 ENCOUNTER — Telehealth: Payer: Self-pay | Admitting: Physician Assistant

## 2018-04-22 MED ORDER — BENZONATATE 200 MG PO CAPS: 200.0000 mg | ORAL_CAPSULE | Freq: Two times a day (BID) | ORAL | 0 refills | Status: DC | PRN

## 2018-04-22 NOTE — Telephone Encounter (Signed)
Pt aware.

## 2018-04-22 NOTE — Telephone Encounter (Signed)
Michael Ramos are sent to the pharmacy.

## 2018-04-22 NOTE — Telephone Encounter (Signed)
Last office visit 12-06-2017.  Please advise on request for medicine for cough.

## 2018-04-26 ENCOUNTER — Telehealth: Payer: Self-pay | Admitting: Physician Assistant

## 2018-04-26 ENCOUNTER — Other Ambulatory Visit: Payer: Self-pay | Admitting: Physician Assistant

## 2018-04-26 MED ORDER — HYDROCODONE-HOMATROPINE 5-1.5 MG/5ML PO SYRP: 5.0000 mL | ORAL_SOLUTION | Freq: Four times a day (QID) | ORAL | 0 refills | Status: DC | PRN

## 2018-04-26 NOTE — Telephone Encounter (Signed)
Cough syrup sent .

## 2018-04-26 NOTE — Telephone Encounter (Signed)
Mom aware.

## 2018-05-29 ENCOUNTER — Other Ambulatory Visit: Payer: Self-pay | Admitting: Physician Assistant

## 2018-05-29 DIAGNOSIS — J3089 Other allergic rhinitis: Secondary | ICD-10-CM

## 2018-06-06 ENCOUNTER — Other Ambulatory Visit: Payer: Self-pay | Admitting: Cardiology

## 2018-06-21 ENCOUNTER — Other Ambulatory Visit: Payer: Self-pay | Admitting: Physician Assistant

## 2018-06-21 NOTE — Telephone Encounter (Signed)
Last seen 12/06/17

## 2018-07-05 ENCOUNTER — Other Ambulatory Visit: Payer: Self-pay | Admitting: Physician Assistant

## 2018-07-15 ENCOUNTER — Encounter: Payer: Self-pay | Admitting: Physician Assistant

## 2018-07-15 ENCOUNTER — Other Ambulatory Visit: Payer: Self-pay

## 2018-07-15 ENCOUNTER — Ambulatory Visit (INDEPENDENT_AMBULATORY_CARE_PROVIDER_SITE_OTHER): Payer: Medicare Other | Admitting: Physician Assistant

## 2018-07-15 DIAGNOSIS — R197 Diarrhea, unspecified: Secondary | ICD-10-CM

## 2018-07-15 NOTE — Progress Notes (Signed)
Telephone visit  Subjective: CC: Diarrhea 3 days PCP: Terald Sleeper, PA-C GYF:[id number] L Michael Michael is a 48 y.o. male calls for telephone consult today. Patient provides verbal consent for consult held via phone.  Patient is identified with 2 separate identifiers.  At this time the entire area is on COVID-19 social distancing and stay home orders are in place.  Patient is of higher risk and therefore we are performing this by a virtual method.  Location of provider: Home Location of patient: Home Location of provider: [facility] Others present for call: Michael Michael  Michael Michael reports that he has had diarrhea for 3 days.  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  He had 5 episodes the first day, then for the next day and only 3 today.  She denies him saying there was any blood.  There is been no fever or chills.  He has not had any nausea or vomiting.  They have tried some Pepto without any relief.  It is of note that he does take metformin.  We have question the possibility of him in creasing Michael carb load and this could aggravate diarrhea more.  Have encouraged her to watch this diligently over the next few days.  Also we have discussed Imodium as a treatment for Michael diarrhea.  She will call if things do not improve.   ROS: Per HPI  No Known Allergies Past Medical History:  Diagnosis Date  . Allergy   . Autism   . CHF (congestive heart failure) ([place])    a. EF 20-25% by echo in 08/2016 b. repeat echo in 02/2017 showing EF 30-35% with cath showing normal cors.   . Diabetes mellitus without complication ([place])   . Obesity (BMI 30-39.9) 10/23/2017    Current Outpatient Medications:  .  benzonatate (TESSALON) 200 MG capsule, Take 1 capsule (200 mg total) by mouth 2 (two) times daily as needed for cough., Disp: 20 capsule, Rfl: 0 .  carvedilol (COREG) 25 MG tablet, TAKE ONE TABLET BY MOUTH TWICE DAILY (Patient taking differently: Take 25  mg by mouth 2 (two) times daily. ), Disp: 180 tablet, Rfl: 3 .  diclofenac sodium (VOLTAREN) 1 % GEL, Apply 4 g topically 4 (four) times daily., Disp: 5 Tube, Rfl: 2 .  ENTRESTO 49-51 MG, TAKE ONE TABLET BY MOUTH TWICE DAILY, Disp: 60 tablet, Rfl: 6 .  furosemide (LASIX) 40 MG tablet, TAKE ONE (1) TABLET EACH DAY, Disp: 90 tablet, Rfl: 1 .  glipiZIDE (GLUCOTROL) 5 MG tablet, TAKE 1 TABLET TWICE A DAY BEFORE A MEAL, Disp: 180 tablet, Rfl: 0 .  HYDROcodone-homatropine (HYCODAN) 5-1.5 MG/5ML syrup, Take 5 mLs by mouth every 6 (six) hours as needed for cough., Disp: 120 mL, Rfl: 0 .  hydrocortisone (ANUSOL-HC) 2.5 % rectal cream, Place 1 application rectally 2 (two) times daily., Disp: 60 g, Rfl: 0 .  hydrocortisone 2.5 % cream, Apply 1 application topically 2 (two) times daily as needed (knee)., Disp: , Rfl:  .  ibuprofen (ADVIL,MOTRIN) 800 MG tablet, Take 800 mg by mouth every 8 (eight) hours as needed for moderate pain., Disp: , Rfl:  .  loratadine (CLARITIN) 10 MG tablet, TAKE ONE (1) TABLET EACH DAY, Disp: 90 tablet, Rfl: 0 .  metFORMIN (GLUCOPHAGE) 500 MG tablet, Take 1 tablet (500 mg total) by mouth daily. (Patient taking differently: Take 500 mg by mouth 2 (two) times daily. ), Disp: 90 tablet, Rfl: 3 .  potassium chloride SA (K-DUR,KLOR-CON) 20 MEQ tablet, TAKE TWO TABLETS BY MOUTH DAILY, Disp: 180 tablet, Rfl: 2  Assessment/ Plan: 48 y.o. male   1. Diarrhea of presumed infectious origin Brat diet Avoid carbs in case diarrhea is being caused by metformin Imodium for the next day or 2 for the diarrhea   Start time: 2:44 PM End time: 2:52 PM  No orders of the defined types were placed in this encounter.   Particia Nearing PA-C [place] [phone]

## 2018-07-30 ENCOUNTER — Telehealth: Payer: Self-pay | Admitting: Cardiology

## 2018-07-30 NOTE — Telephone Encounter (Signed)
Virtual Visit Pre-Appointment Phone Call  "(Name), I am calling you today to discuss your upcoming appointment. We are currently trying to limit exposure to the virus that causes COVID-19 by seeing patients at home rather than in the office."  1. "What is the BEST phone number to call the day of the visit?" - include this in appointment notes  2. Do you have or have access to (through a family member/friend) a smartphone with video capability that we can use for your visit?" a. If yes - list this number in appt notes as cell (if different from BEST phone #) and list the appointment type as a VIDEO visit in appointment notes b. If no - list the appointment type as a PHONE visit in appointment notes  3. Confirm consent - "In the setting of the current Covid19 crisis, you are scheduled for a (phone or video) visit with your provider on (date) at (time).  Just as we do with many in-office visits, in order for you to participate in this visit, we must obtain consent.  If you'd like, I can send this to your mychart (if signed up) or email for you to review.  Otherwise, I can obtain your verbal consent now.  All virtual visits are billed to your insurance company just like a normal visit would be.  By agreeing to a virtual visit, we'd like you to understand that the technology does not allow for your provider to perform an examination, and thus may limit your provider's ability to fully assess your condition. If your provider identifies any concerns that need to be evaluated in person, we will make arrangements to do so.  Finally, though the technology is pretty good, we cannot assure that it will always work on either your or our end, and in the setting of a video visit, we may have to convert it to a phone-only visit.  In either situation, we cannot ensure that we have a secure connection.  Are you willing to proceed?" STAFF: Did the patient verbally acknowledge consent to telehealth visit? Document  YES/NO here: yes  4. Advise patient to be prepared - "Two hours prior to your appointment, go ahead and check your blood pressure, pulse, oxygen saturation, and your weight (if you have the equipment to check those) and write them all down. When your visit starts, your provider will ask you for this information. If you have an Apple Watch or Kardia device, please plan to have heart rate information ready on the day of your appointment. Please have a pen and paper handy nearby the day of the visit as well."  5. Give patient instructions for MyChart download to smartphone OR Doximity/Doxy.me as below if video visit (depending on what platform provider is using)  6. Inform patient they will receive a phone call 15 minutes prior to their appointment time (may be from unknown caller ID) so they should be prepared to answer    TELEPHONE CALL NOTE  ABDIMALIK MAYORQUIN has been deemed a candidate for a follow-up tele-health visit to limit community exposure during the Covid-19 pandemic. I spoke with the patient via phone to ensure availability of phone/video source, confirm preferred email & phone number, and discuss instructions and expectations.  I reminded Michael Ramos to be prepared with any vital sign and/or heart rhythm information that could potentially be obtained via home monitoring, at the time of his visit. I reminded Michael Ramos to expect a phone call prior to  his visit.  Weston Anna 07/30/2018 11:15 AM   INSTRUCTIONS FOR DOWNLOADING THE MYCHART APP TO SMARTPHONE  - The patient must first make sure to have activated MyChart and know their login information - If Apple, go to [employer] and type in MyChart in the search bar and download the app. If Android, ask patient to go to [employer] and type in [place] in the search bar and download the app. The app is free but as with any other app downloads, their phone may require them to verify saved payment information or  Apple/Android password.  - The patient will need to then log into the app with their MyChart username and password, and select [facility] as their healthcare provider to link the account. When it is time for your visit, go to the MyChart app, find appointments, and click Begin Video Visit. Be sure to Select Allow for your device to access the Microphone and Camera for your visit. You will then be connected, and your provider will be with you shortly.  **If they have any issues connecting, or need assistance please contact MyChart service desk [phone])**  **If using a computer, in order to ensure the best quality for their visit they will need to use either of the following Internet Browsers: [employer], or Google Chrome**  IF USING DOXIMITY or DOXY.ME - The patient will receive a link just prior to their visit by text.     FULL LENGTH CONSENT FOR TELE-HEALTH VISIT   I hereby voluntarily request, consent and authorize [facility] and its employed or contracted physicians, physician assistants, nurse practitioners or other licensed health care professionals (the Practitioner), to provide me with telemedicine health care services (the Services") as deemed necessary by the treating Practitioner. I acknowledge and consent to receive the Services by the Practitioner via telemedicine. I understand that the telemedicine visit will involve communicating with the Practitioner through live audiovisual communication technology and the disclosure of certain medical information by electronic transmission. I acknowledge that I have been given the opportunity to request an in-person assessment or other available alternative prior to the telemedicine visit and am voluntarily participating in the telemedicine visit.  I understand that I have the right to withhold or withdraw my consent to the use of telemedicine in the course of my care at any time, without affecting my right to future care  or treatment, and that the Practitioner or I may terminate the telemedicine visit at any time. I understand that I have the right to inspect all information obtained and/or recorded in the course of the telemedicine visit and may receive copies of available information for a reasonable fee.  I understand that some of the potential risks of receiving the Services via telemedicine include:   Delay or interruption in medical evaluation due to technological equipment failure or disruption;  Information transmitted may not be sufficient (e.g. poor resolution of images) to allow for appropriate medical decision making by the Practitioner; and/or   In rare instances, security protocols could fail, causing a breach of personal health information.  Furthermore, I acknowledge that it is my responsibility to provide information about my medical history, conditions and care that is complete and accurate to the best of my ability. I acknowledge that Practitioner's advice, recommendations, and/or decision may be based on factors not within their control, such as incomplete or inaccurate data provided by me or distortions of diagnostic images or specimens that may result from electronic transmissions. I  understand that the practice of medicine is not an exact science and that Practitioner makes no warranties or guarantees regarding treatment outcomes. I acknowledge that I will receive a copy of this consent concurrently upon execution via email to the email address I last provided but may also request a printed copy by calling the office of [facility].    I understand that my insurance will be billed for this visit.   I have read or had this consent read to me.  I understand the contents of this consent, which adequately explains the benefits and risks of the Services being provided via telemedicine.   I have been provided ample opportunity to ask questions regarding this consent and the Services and have had  my questions answered to my satisfaction.  I give my informed consent for the services to be provided through the use of telemedicine in my medical care  By participating in this telemedicine visit I agree to the above.

## 2018-08-02 ENCOUNTER — Encounter: Payer: Self-pay | Admitting: Cardiology

## 2018-08-02 ENCOUNTER — Telehealth (INDEPENDENT_AMBULATORY_CARE_PROVIDER_SITE_OTHER): Payer: Medicare Other | Admitting: Cardiology

## 2018-08-02 ENCOUNTER — Ambulatory Visit: Payer: Medicare Other | Admitting: Cardiology

## 2018-08-02 VITALS — HR 65 | Ht 75.0 in | Wt 290.0 lb

## 2018-08-02 DIAGNOSIS — I5022 Chronic systolic (congestive) heart failure: Secondary | ICD-10-CM

## 2018-08-02 NOTE — Progress Notes (Signed)
Virtual Visit via Telephone Note   This visit type was conducted due to national recommendations for restrictions regarding the COVID-19 Pandemic (e.g. social distancing) in an effort to limit this patient's exposure and mitigate transmission in our community.  Due to his co-morbid illnesses, this patient is at least at moderate risk for complications without adequate follow up.  This format is felt to be most appropriate for this patient at this time.  The patient did not have access to video technology/had technical difficulties with video requiring transitioning to audio format only (telephone).  All issues noted in this document were discussed and addressed.  No physical exam could be performed with this format.  Please refer to the patient's chart for his  consent to telehealth for [facility].   Evaluation Performed:  Follow-up visit  Date:  08/02/2018   ID:  Michael Ramos, DOB 12/11/1970, MRN [id number]  Patient Location: Home Provider Location: Home  PCP:  Terald Sleeper, PA-C  Cardiologist:  Carlyle Dolly, MD  Electrophysiologist:  None   Chief Complaint:  6 month f/u  History of Present Illness:    Michael Ramos is a 48 y.o. male seen today for follow up of the following medical problems.  History today is obtained from patient's mother due to patient's autism   1. Chronic systolic HF - new diagnosis during 08/2016 admission with fluid overload - 02/2017 cath no significant CAD.  07/2017 echo LVEF 35-40%, grade II diastolic dysfunction - no SOB or DOE - compliant with meds  - no recent SOB or DOE. No recent edema - compliant with meds   2. Nocturnal hypoxia/Possible sleep apnea - followed by Dr Radford Pax - drop in O2 sats noted during home oximetry. There were discussions about a formal sleep study however family does not feel patient could cooperate with study due to his auticsion   The patient does not have symptoms concerning for COVID-19  infection (fever, chills, cough, or new shortness of breath).    Past Medical History:  Diagnosis Date  . Allergy   . Autism   . CHF (congestive heart failure) ([place])    a. EF 20-25% by echo in 08/2016 b. repeat echo in 02/2017 showing EF 30-35% with cath showing normal cors.   . Diabetes mellitus without complication ([place])   . Obesity (BMI 30-39.9) 10/23/2017   Past Surgical History:  Procedure Laterality Date  . RIGHT/LEFT HEART CATH AND CORONARY ANGIOGRAPHY N/A 03/06/2017   Procedure: RIGHT/LEFT HEART CATH AND CORONARY ANGIOGRAPHY;  Surgeon: Burnell Blanks, MD;  Location: [facility];  Service: Cardiovascular;  Laterality: N/A;     Current Meds  Medication Sig  . benzonatate (TESSALON) 200 MG capsule Take 1 capsule (200 mg total) by mouth 2 (two) times daily as needed for cough.  . carvedilol (COREG) 25 MG tablet TAKE ONE TABLET BY MOUTH TWICE DAILY (Patient taking differently: Take 25 mg by mouth 2 (two) times daily. )  . diclofenac sodium (VOLTAREN) 1 % GEL Apply 4 g topically 4 (four) times daily.  Marland Kitchen ENTRESTO 49-51 MG TAKE ONE TABLET BY MOUTH TWICE DAILY  . furosemide (LASIX) 40 MG tablet TAKE ONE (1) TABLET EACH DAY  . glipiZIDE (GLUCOTROL) 5 MG tablet TAKE 1 TABLET TWICE A DAY BEFORE A MEAL  . HYDROcodone-homatropine (HYCODAN) 5-1.5 MG/5ML syrup Take 5 mLs by mouth every 6 (six) hours as needed for cough.  . hydrocortisone (ANUSOL-HC) 2.5 % rectal cream Place 1 application rectally 2 (two)  times daily.  . hydrocortisone 2.5 % cream Apply 1 application topically 2 (two) times daily as needed (knee).  Marland Kitchen ibuprofen (ADVIL,MOTRIN) 800 MG tablet Take 800 mg by mouth every 8 (eight) hours as needed for moderate pain.  Marland Kitchen loratadine (CLARITIN) 10 MG tablet TAKE ONE (1) TABLET EACH DAY  . metFORMIN (GLUCOPHAGE) 500 MG tablet Take 1 tablet (500 mg total) by mouth daily. (Patient taking differently: Take 500 mg by mouth 2 (two) times daily. )  . potassium chloride SA  (K-DUR,KLOR-CON) 20 MEQ tablet TAKE TWO TABLETS BY MOUTH DAILY     Allergies:   Patient has no known allergies.   Social History   Tobacco Use  . Smoking status: Never Smoker  . Smokeless tobacco: Never Used  Substance Use Topics  . Alcohol use: No  . Drug use: No     Family Hx: The patient's family history includes Cancer in his father; Diabetes in his father and mother.  ROS:   Please see the history of present illness.     All other systems reviewed and are negative.   Prior CV studies:   The following studies were reviewed today:  02/2017 cath  Hemodynamic findings consistent with mild pulmonary hypertension.  1. No angiographic evidence of CAD 2. Non-ischemic cardiomyopathy  Recommendations: medical management of cardiomyopathy and CHF.   02/2017 echo Study Conclusions  - Left ventricle: The cavity size was mildly dilated. Wall thickness was normal. Systolic function was moderately to severely reduced. The estimated ejection fraction was in the range of 30% to 35%. Diffuse hypokinesis. Diastolic dysfunction, grade indeterminate. Indeterminate filling pressures. - Mitral valve: There was mild regurgitation.   07/2017 echo Study Conclusions  - Left ventricle: The cavity size was mildly dilated. Wall thickness was normal. Systolic function was moderately reduced. The estimated ejection fraction was in the range of 35% to 40%. Diffuse hypokinesis. Features are consistent with a pseudonormal left ventricular filling pattern, with concomitant abnormal relaxation and increased filling pressure (grade 2 diastolic dysfunction). - Mitral valve: There was mild regurgitation.  Labs/Other Tests and Data Reviewed:    EKG:  na  Recent Labs: 01/01/2018: ALT 21; BUN 20; Creatinine, Ser 1.46; Hemoglobin 13.7; Platelets 200; Potassium 4.5; Sodium 141   Recent Lipid Panel Lab Results  Component Value Date/Time   CHOL 138 07/05/2017  12:28 PM   TRIG 257 (H) 07/05/2017 12:28 PM   HDL 45 07/05/2017 12:28 PM   CHOLHDL 3.1 07/05/2017 12:28 PM   LDLCALC 42 07/05/2017 12:28 PM    Wt Readings from Last 3 Encounters:  08/02/18 290 lb (131.5 kg)  02/01/18 294 lb 6.4 oz (133.5 kg)  12/31/17 295 lb 11.2 oz (134.1 kg)     Objective:    Vital Signs:  Pulse 65   Ht 6\' 3"  (1.905 m)   Wt 290 lb (131.5 kg)   BMI 36.25 kg/m    No exam. History is obtained through patient's mother due to his autisism  ASSESSMENT & PLAN:    1. Chronic systolic HF/ NICM - LVEF 02-63%, NYHA II.He will not require and ICD - medical therapy limited by his renal function with mild uptredn in Cr recently, would not be more aggressive with entresto dosing at this time, would not plan for aldactone at this time.   - doing well without symptoms, conitnue current meds. Will need repeat labs at next visit, possible med changes based on those lab results    COVID-19 Education: The signs and symptoms of COVID-19  were discussed with the patient and how to seek care for testing (follow up with PCP or arrange E-visit).  The importance of social distancing was discussed today.  Time:   Today, I have spent 16 minutes with the patient with telehealth technology discussing the above problems.     Medication Adjustments/Labs and Tests Ordered: Current medicines are reviewed at length with the patient today.  Concerns regarding medicines are outlined above.   Tests Ordered: No orders of the defined types were placed in this encounter.   Medication Changes: No orders of the defined types were placed in this encounter.   Disposition:  Follow up 3 months  Signed, Carlyle Dolly, MD  08/02/2018 10:33 AM    [place]

## 2018-08-02 NOTE — Progress Notes (Signed)
Medication Instructions:  Your physician recommends that you continue on your current medications as directed. Please refer to the Current Medication list given to you today.   Labwork: NONE  Testing/Procedures: NONE  Follow-Up: Your physician recommends that you schedule a follow-up appointment in: 3 MONTHS    Any Other Special Instructions Will Be Listed Below (If Applicable).     If you need a refill on your cardiac medications before your next appointment, please call your pharmacy.   

## 2018-08-15 ENCOUNTER — Telehealth: Payer: Self-pay | Admitting: Physician Assistant

## 2018-08-21 ENCOUNTER — Other Ambulatory Visit: Payer: Self-pay | Admitting: Cardiology

## 2018-08-21 ENCOUNTER — Other Ambulatory Visit: Payer: Self-pay | Admitting: Physician Assistant

## 2018-08-21 DIAGNOSIS — J3089 Other allergic rhinitis: Secondary | ICD-10-CM

## 2018-08-22 ENCOUNTER — Other Ambulatory Visit: Payer: Self-pay | Admitting: Cardiology

## 2018-09-09 ENCOUNTER — Encounter (HOSPITAL_COMMUNITY): Payer: Self-pay | Admitting: Emergency Medicine

## 2018-09-09 ENCOUNTER — Emergency Department (HOSPITAL_COMMUNITY)
Admission: EM | Admit: 2018-09-09 | Discharge: 2018-09-09 | Disposition: A | Discharge status: Home / Self Care | Payer: Medicare Other | Attending: Emergency Medicine | Admitting: Emergency Medicine

## 2018-09-09 ENCOUNTER — Other Ambulatory Visit: Payer: Self-pay

## 2018-09-09 ENCOUNTER — Telehealth: Payer: Self-pay | Admitting: Physician Assistant

## 2018-09-09 ENCOUNTER — Emergency Department (HOSPITAL_COMMUNITY): Payer: Medicare Other

## 2018-09-09 DIAGNOSIS — R1084 Generalized abdominal pain: Secondary | ICD-10-CM | POA: Diagnosis not present

## 2018-09-09 DIAGNOSIS — R0902 Hypoxemia: Secondary | ICD-10-CM | POA: Diagnosis not present

## 2018-09-09 DIAGNOSIS — Z03818 Encounter for observation for suspected exposure to other biological agents ruled out: Secondary | ICD-10-CM | POA: Diagnosis not present

## 2018-09-09 DIAGNOSIS — R05 Cough: Secondary | ICD-10-CM | POA: Insufficient documentation

## 2018-09-09 DIAGNOSIS — R52 Pain, unspecified: Secondary | ICD-10-CM | POA: Diagnosis not present

## 2018-09-09 DIAGNOSIS — I110 Hypertensive heart disease with heart failure: Secondary | ICD-10-CM | POA: Insufficient documentation

## 2018-09-09 DIAGNOSIS — R1010 Upper abdominal pain, unspecified: Secondary | ICD-10-CM

## 2018-09-09 DIAGNOSIS — R042 Hemoptysis: Secondary | ICD-10-CM

## 2018-09-09 DIAGNOSIS — R4182 Altered mental status, unspecified: Secondary | ICD-10-CM | POA: Diagnosis present

## 2018-09-09 DIAGNOSIS — N3001 Acute cystitis with hematuria: Secondary | ICD-10-CM | POA: Insufficient documentation

## 2018-09-09 DIAGNOSIS — E119 Type 2 diabetes mellitus without complications: Secondary | ICD-10-CM | POA: Insufficient documentation

## 2018-09-09 DIAGNOSIS — Z7984 Long term (current) use of oral hypoglycemic drugs: Secondary | ICD-10-CM | POA: Diagnosis not present

## 2018-09-09 DIAGNOSIS — R531 Weakness: Secondary | ICD-10-CM | POA: Insufficient documentation

## 2018-09-09 DIAGNOSIS — I491 Atrial premature depolarization: Secondary | ICD-10-CM | POA: Diagnosis not present

## 2018-09-09 DIAGNOSIS — I5022 Chronic systolic (congestive) heart failure: Secondary | ICD-10-CM | POA: Diagnosis not present

## 2018-09-09 DIAGNOSIS — R0602 Shortness of breath: Secondary | ICD-10-CM | POA: Diagnosis not present

## 2018-09-09 DIAGNOSIS — Z79899 Other long term (current) drug therapy: Secondary | ICD-10-CM | POA: Diagnosis not present

## 2018-09-09 DIAGNOSIS — Z20828 Contact with and (suspected) exposure to other viral communicable diseases: Secondary | ICD-10-CM | POA: Insufficient documentation

## 2018-09-09 DIAGNOSIS — R404 Transient alteration of awareness: Secondary | ICD-10-CM | POA: Diagnosis not present

## 2018-09-09 LAB — CBC
HCT: 43 % (ref 39.0–52.0)
Hemoglobin: 13.9 g/dL (ref 13.0–17.0)
MCH: 29 pg (ref 26.0–34.0)
MCHC: 32.3 g/dL (ref 30.0–36.0)
MCV: 89.6 fL (ref 80.0–100.0)
Platelets: 177 10*3/uL (ref 150–400)
RBC: 4.8 MIL/uL (ref 4.22–5.81)
RDW: 13 % (ref 11.5–15.5)
WBC: 10.1 10*3/uL (ref 4.0–10.5)
nRBC: 0 % (ref 0.0–0.2)

## 2018-09-09 LAB — DIFFERENTIAL
Abs Immature Granulocytes: 0.06 10*3/uL (ref 0.00–0.07)
Basophils Absolute: 0.1 10*3/uL (ref 0.0–0.1)
Basophils Relative: 1 %
Eosinophils Absolute: 0.3 10*3/uL (ref 0.0–0.5)
Eosinophils Relative: 3 %
Immature Granulocytes: 1 %
Lymphocytes Relative: 16 %
Lymphs Abs: 1.6 10*3/uL (ref 0.7–4.0)
Monocytes Absolute: 0.8 10*3/uL (ref 0.1–1.0)
Monocytes Relative: 8 %
Neutro Abs: 7.3 10*3/uL (ref 1.7–7.7)
Neutrophils Relative %: 71 %

## 2018-09-09 LAB — URINALYSIS, ROUTINE W REFLEX MICROSCOPIC
Bacteria, UA: NONE SEEN
Bilirubin Urine: NEGATIVE
Glucose, UA: 50 mg/dL — AB
Ketones, ur: NEGATIVE mg/dL
Nitrite: NEGATIVE
Protein, ur: 30 mg/dL — AB
Specific Gravity, Urine: 1.018 (ref 1.005–1.030)
pH: 5 (ref 5.0–8.0)

## 2018-09-09 LAB — COMPREHENSIVE METABOLIC PANEL
ALT: 27 U/L (ref 0–44)
AST: 22 U/L (ref 15–41)
Albumin: 3.7 g/dL (ref 3.5–5.0)
Alkaline Phosphatase: 35 U/L — ABNORMAL LOW (ref 38–126)
Anion gap: 11 (ref 5–15)
BUN: 17 mg/dL (ref 6–20)
CO2: 26 mmol/L (ref 22–32)
Calcium: 8.9 mg/dL (ref 8.9–10.3)
Chloride: 101 mmol/L (ref 98–111)
Creatinine, Ser: 1.39 mg/dL — ABNORMAL HIGH (ref 0.61–1.24)
GFR calc Af Amer: >60 mL/min (ref >60)
GFR calc non Af Amer: >60 mL/min (ref >60)
Glucose, Bld: 216 mg/dL — ABNORMAL HIGH (ref 70–99)
Potassium: 5.2 mmol/L — ABNORMAL HIGH (ref 3.5–5.1)
Sodium: 138 mmol/L (ref 135–145)
Total Bilirubin: 0.7 mg/dL (ref 0.3–1.2)
Total Protein: 7.4 g/dL (ref 6.5–8.1)

## 2018-09-09 LAB — LACTIC ACID, PLASMA: Lactic Acid, Venous: 1.7 mmol/L (ref 0.5–1.9)

## 2018-09-09 LAB — CBG MONITORING, ED: Glucose-Capillary: 179 mg/dL — ABNORMAL HIGH (ref 70–99)

## 2018-09-09 LAB — SARS CORONAVIRUS 2 BY RT PCR (HOSPITAL ORDER, PERFORMED IN ~~LOC~~ HOSPITAL LAB): SARS Coronavirus 2: NEGATIVE

## 2018-09-09 MED ORDER — SODIUM CHLORIDE 0.9 % IV BOLUS
500.0000 mL | Freq: Once | INTRAVENOUS | Status: AC
  Administered 2018-09-09: 500 mL via INTRAVENOUS

## 2018-09-09 MED ORDER — PREDNISONE 20 MG PO TABS: ORAL_TABLET | ORAL | 0 refills | Status: DC

## 2018-09-09 MED ORDER — SULFAMETHOXAZOLE-TRIMETHOPRIM 800-160 MG PO TABS: 1.0000 | ORAL_TABLET | Freq: Two times a day (BID) | ORAL | 0 refills | Status: AC

## 2018-09-09 MED ORDER — SULFAMETHOXAZOLE-TRIMETHOPRIM 800-160 MG PO TABS
1.0000 | ORAL_TABLET | Freq: Once | ORAL | Status: AC
  Administered 2018-09-09: 1 via ORAL
  Filled 2018-09-09: qty 1

## 2018-09-09 NOTE — Telephone Encounter (Signed)
Yes, full abdominal US, and go ahead and schedule GI urgent. May have to be [place].

## 2018-09-09 NOTE — ED Provider Notes (Signed)
[facility] EMERGENCY DEPARTMENT Provider Note   CSN: [id number] Arrival date & time: 09/09/18  0406    History   Chief Complaint Chief Complaint  Patient presents with  . Altered Mental Status    HPI Michael Ramos is a 48 y.o. male.     The patient has a history of autism.  His mother states that he seems a little bit weak and confused and then coughed up a little blood.  The history is provided by a relative. No language interpreter was used.  Altered Mental Status  Presenting symptoms: no combativeness   Severity:  Mild Most recent episode:  Today Episode history:  Single Timing:  Constant Progression:  Waxing and waning Chronicity:  Recurrent Context: not alcohol use   Associated symptoms: no abdominal pain, no hallucinations, no headaches, no rash and no seizures     Past Medical History:  Diagnosis Date  . Allergy   . Autism   . CHF (congestive heart failure) ([place])    a. EF 20-25% by echo in 08/2016 b. repeat echo in 02/2017 showing EF 30-35% with cath showing normal cors.   . Diabetes mellitus without complication ([place])   . Obesity (BMI 30-39.9) 10/23/2017    Patient Active Problem List   Diagnosis Date Noted  . Hip fracture ([place]) 12/31/2017  . Obesity (BMI 30-39.9) 10/23/2017  . Chronic systolic heart failure ([place])   . Non-ischemic cardiomyopathy ([place])   . Dilated cardiomyopathy ([place]) 08/28/2016  . Acute on chronic renal insufficiency 08/28/2016  . Essential hypertension 08/25/2016  . Acute systolic CHF (congestive heart failure) ([place]) 08/18/2016  . Shortness of breath 08/15/2016  . Type 2 diabetes mellitus without complication, without long-term current use of insulin ([place]) 03/14/2016  . Chronic nonseasonal allergic rhinitis due to pollen 03/14/2016  . Autism 03/14/2016    Past Surgical History:  Procedure Laterality Date  . RIGHT/LEFT HEART CATH AND CORONARY ANGIOGRAPHY N/A 03/06/2017   Procedure: RIGHT/LEFT HEART CATH AND CORONARY  ANGIOGRAPHY;  Surgeon: Burnell Blanks, MD;  Location: [facility];  Service: Cardiovascular;  Laterality: N/A;        Home Medications    Prior to Admission medications   Medication Sig Start Date End Date Taking? Authorizing Provider  carvedilol (COREG) 25 MG tablet TAKE ONE TABLET BY MOUTH TWICE DAILY 08/21/18  Yes Branch, Alphonse Guild, MD  ENTRESTO 49-51 MG TAKE ONE TABLET BY MOUTH TWICE DAILY 08/22/18  Yes Arnoldo Lenis, MD  furosemide (LASIX) 40 MG tablet TAKE ONE (1) TABLET EACH DAY 03/25/18  Yes Branch, Alphonse Guild, MD  glipiZIDE (GLUCOTROL) 5 MG tablet TAKE 1 TABLET TWICE A DAY BEFORE A MEAL 06/21/18  Yes Terald Sleeper, PA-C  ibuprofen (ADVIL,MOTRIN) 800 MG tablet Take 800 mg by mouth every 8 (eight) hours as needed for moderate pain.   Yes [provider]  loratadine (CLARITIN) 10 MG tablet TAKE ONE (1) TABLET EACH DAY 08/21/18  Yes Terald Sleeper, PA-C  metFORMIN (GLUCOPHAGE) 500 MG tablet Take 1 tablet (500 mg total) by mouth daily. Patient taking differently: Take 500 mg by mouth 2 (two) times daily.  07/05/17  Yes Terald Sleeper, PA-C  potassium chloride SA (K-DUR,KLOR-CON) 20 MEQ tablet TAKE TWO TABLETS BY MOUTH DAILY 06/06/18  Yes Branch, Alphonse Guild, MD  benzonatate (TESSALON) 200 MG capsule Take 1 capsule (200 mg total) by mouth 2 (two) times daily as needed for cough. 04/22/18   Terald Sleeper, PA-C  diclofenac sodium (VOLTAREN)  1 % GEL Apply 4 g topically 4 (four) times daily. 12/06/17   Hassell Done, Mary-Margaret, FNP  HYDROcodone-homatropine (HYCODAN) 5-1.5 MG/5ML syrup Take 5 mLs by mouth every 6 (six) hours as needed for cough. 04/26/18   Terald Sleeper, PA-C  hydrocortisone (ANUSOL-HC) 2.5 % rectal cream Place 1 application rectally 2 (two) times daily. 11/16/17   Terald Sleeper, PA-C  hydrocortisone 2.5 % cream Apply 1 application topically 2 (two) times daily as needed (knee).    [provider]  sulfamethoxazole-trimethoprim (BACTRIM DS) 800-160  MG tablet Take 1 tablet by mouth 2 (two) times daily for 7 days. 09/09/18 09/16/18  Milton Ferguson, MD    Family History Family History  Problem Relation Age of Onset  . Diabetes Mother   . Cancer Father   . Diabetes Father     Social History Social History   Tobacco Use  . Smoking status: Never Smoker  . Smokeless tobacco: Never Used  Substance Use Topics  . Alcohol use: No  . Drug use: No     Allergies   Patient has no known allergies.   Review of Systems Review of Systems  Constitutional: Negative for appetite change and fatigue.  HENT: Negative for congestion, ear discharge and sinus pressure.   Eyes: Negative for discharge.  Respiratory: Positive for cough.   Cardiovascular: Negative for chest pain.  Gastrointestinal: Negative for abdominal pain and diarrhea.  Genitourinary: Negative for frequency and hematuria.  Musculoskeletal: Negative for back pain.  Skin: Negative for rash.  Neurological: Negative for seizures and headaches.  Psychiatric/Behavioral: Negative for hallucinations.  All other systems reviewed and are negative.    Physical Exam Updated Vital Signs BP 128/85   Pulse 76   Temp (!) 97.5 F (36.4 C) (Oral)   Resp 19   Ht 6\' 3"  (1.905 m)   Wt 131.5 kg   SpO2 99%   BMI 36.25 kg/m   Physical Exam Vitals signs and nursing note reviewed.  Constitutional:      Appearance: He is well-developed.  HENT:     Head: Normocephalic.     Nose: Nose normal.  Eyes:     General: No scleral icterus.    Conjunctiva/sclera: Conjunctivae normal.  Neck:     Musculoskeletal: Neck supple.     Thyroid: No thyromegaly.  Cardiovascular:     Rate and Rhythm: Normal rate and regular rhythm.     Heart sounds: No murmur. No friction rub. No gallop.   Pulmonary:     Breath sounds: No stridor. No wheezing or rales.  Chest:     Chest wall: No tenderness.  Abdominal:     General: There is no distension.     Tenderness: There is no abdominal tenderness. There  is no rebound.  Musculoskeletal: Normal range of motion.  Lymphadenopathy:     Cervical: No cervical adenopathy.  Skin:    Findings: No erythema or rash.  Neurological:     Mental Status: He is oriented to person, place, and time.     Motor: No abnormal muscle tone.     Coordination: Coordination normal.     Comments: Mildly lethargic  Psychiatric:        Behavior: Behavior normal.      ED Treatments / Results  Labs (all labs ordered are listed, but only abnormal results are displayed) Labs Reviewed  COMPREHENSIVE METABOLIC PANEL - Abnormal; Notable for the following components:      Result Value   Potassium 5.2 (*)  Glucose, Bld 216 (*)    Creatinine, Ser 1.39 (*)    Alkaline Phosphatase 35 (*)    All other components within normal limits  URINALYSIS, ROUTINE W REFLEX MICROSCOPIC - Abnormal; Notable for the following components:   Glucose, UA 50 (*)    Hgb urine dipstick LARGE (*)    Protein, ur 30 (*)    Leukocytes,Ua TRACE (*)    All other components within normal limits  CBG MONITORING, ED - Abnormal; Notable for the following components:   Glucose-Capillary 179 (*)    All other components within normal limits  SARS CORONAVIRUS 2 (HOSPITAL ORDER, [facility])  URINE CULTURE  CBC  DIFFERENTIAL  LACTIC ACID, PLASMA    EKG None  Radiology Dg Chest Portable 1 View  Result Date: 09/09/2018 CLINICAL DATA:  48 year old male with shortness of breath, cough with blood tinged sputum. COVID-19 status pending. EXAM: PORTABLE CHEST 1 VIEW COMPARISON:  Chest radiographs 12/31/2017 and earlier. FINDINGS: Portable AP semi upright view at 0440 hours. 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 Visualized tracheal air column is within normal limits. Stable lung volumes. Allowing for portable technique the lungs are clear. No pneumothorax or pleural effusion. No osseous abnormality identified. IMPRESSION: No acute  cardiopulmonary abnormality. Electronically Signed   By: Genevie Ann M.D.   On: 09/09/2018 04:52    Procedures Procedures (including critical care time)  Medications Ordered in ED Medications  sulfamethoxazole-trimethoprim (BACTRIM DS) 800-160 MG per tablet 1 tablet (has no administration in time range)  sodium chloride 0.9 % bolus 500 mL (0 mLs Intravenous Stopped 09/09/18 0545)     Initial Impression / Assessment and Plan / ED Course  I have reviewed the triage vital signs and the nursing notes.  Pertinent labs & imaging results that were available during my care of the patient were reviewed by me and considered in my medical decision making (see chart for details).        Lab work and chest x-ray unremarkable except for possible urinary tract infection.  Patient seems to be back to his normal.  We will culture his urine and place him on Bactrim to cover possible urinary tract infection and respiratory infection.  He will follow-up with his doctor in 2 to 3 days Final Clinical Impressions(s) / ED Diagnoses   Final diagnoses:  Acute cystitis with hematuria    ED Discharge Orders         Ordered    predniSONE (DELTASONE) 20 MG tablet  Status:  Discontinued     09/09/18 0543    sulfamethoxazole-trimethoprim (BACTRIM DS) 800-160 MG tablet  2 times daily     09/09/18 0630           Milton Ferguson, MD 09/09/18 [phone]

## 2018-09-09 NOTE — Telephone Encounter (Signed)
Sister states that he had a syncopal episode this morning. He also spit up blood 3 times this morning. He went to ER and they state he has UTI. He is complaining with abdominal pain. Sister wants to know if he can have ultra sound of abdomen or further testing for the spitting up blood and abdominal pain.

## 2018-09-09 NOTE — Discharge Instructions (Addendum)
Follow-up with your doctor in 2 -3 days for recheck

## 2018-09-09 NOTE — ED Triage Notes (Signed)
Pt brought in by EMS for AMS. Per pt's mother, pt was acting like he was "in a fog" and that pt was cold and sweaty and weak and that pt had blood-tinged sputum after he coughed.

## 2018-09-10 ENCOUNTER — Ambulatory Visit (HOSPITAL_COMMUNITY)
Admission: RE | Admit: 2018-09-10 | Discharge: 2018-09-10 | Disposition: A | Discharge status: Home / Self Care | Payer: Medicare Other | Source: Ambulatory Visit | Attending: Physician Assistant | Admitting: Physician Assistant

## 2018-09-10 DIAGNOSIS — R1010 Upper abdominal pain, unspecified: Secondary | ICD-10-CM | POA: Diagnosis not present

## 2018-09-10 DIAGNOSIS — R042 Hemoptysis: Secondary | ICD-10-CM | POA: Diagnosis not present

## 2018-09-10 DIAGNOSIS — R109 Unspecified abdominal pain: Secondary | ICD-10-CM | POA: Diagnosis not present

## 2018-09-10 LAB — URINE CULTURE: Culture: NO GROWTH

## 2018-09-10 NOTE — Telephone Encounter (Signed)
Patient sister aware and referrals placed

## 2018-09-11 ENCOUNTER — Other Ambulatory Visit: Payer: Self-pay

## 2018-09-11 ENCOUNTER — Encounter (INDEPENDENT_AMBULATORY_CARE_PROVIDER_SITE_OTHER): Payer: Self-pay | Admitting: Internal Medicine

## 2018-09-11 ENCOUNTER — Telehealth: Payer: Self-pay | Admitting: Physician Assistant

## 2018-09-11 ENCOUNTER — Ambulatory Visit (INDEPENDENT_AMBULATORY_CARE_PROVIDER_SITE_OTHER): Payer: Medicare Other | Admitting: Internal Medicine

## 2018-09-11 ENCOUNTER — Other Ambulatory Visit: Payer: Self-pay | Admitting: *Deleted

## 2018-09-11 VITALS — BP 118/72 | HR 90 | Temp 97.6°F | Ht 75.0 in | Wt 300.7 lb

## 2018-09-11 DIAGNOSIS — K529 Noninfective gastroenteritis and colitis, unspecified: Secondary | ICD-10-CM

## 2018-09-11 DIAGNOSIS — R932 Abnormal findings on diagnostic imaging of liver and biliary tract: Secondary | ICD-10-CM

## 2018-09-11 NOTE — Telephone Encounter (Signed)
Pt referred to [facility]

## 2018-09-11 NOTE — Patient Instructions (Addendum)
GI pathogen  Imodium one in am and one at night

## 2018-09-11 NOTE — Progress Notes (Signed)
   Subjective:    Patient ID: Michael Ramos, male    DOB: March 19, 1971, 48 y.o.   MRN: [id number]  HPI Referred by Particia Nearing PA-C for diarrhea.  He does not have any solid stools.  Has 4-5 stools a day. No blood in his stools.  No recent antibiotics. On Bactrim presently for a UTI.  His appetite is good. No weight loss. No family hx of Michael cancer.  Has been on Metformin for a bout a year. Diarrhea "for a while" Takes Imodium as needed for his diarrhea.     Review of Systems Past Medical History:  Diagnosis Date  . Allergy   . Autism   . CHF (congestive heart failure) ([place])    a. EF 20-25% by echo in 08/2016 b. repeat echo in 02/2017 showing EF 30-35% with cath showing normal cors.   . Diabetes mellitus without complication ([place])   . Obesity (BMI 30-39.9) 10/23/2017    Past Surgical History:  Procedure Laterality Date  . RIGHT/LEFT HEART CATH AND CORONARY ANGIOGRAPHY N/A 03/06/2017   Procedure: RIGHT/LEFT HEART CATH AND CORONARY ANGIOGRAPHY;  Surgeon: Burnell Blanks, MD;  Location: [facility];  Service: Cardiovascular;  Laterality: N/A;    No Known Allergies  Current Outpatient Medications on File Prior to Visit  Medication Sig Dispense Refill  . carvedilol (COREG) 25 MG tablet TAKE ONE TABLET BY MOUTH TWICE DAILY 180 tablet 3  . ENTRESTO 49-51 MG TAKE ONE TABLET BY MOUTH TWICE DAILY 60 tablet 6  . furosemide (LASIX) 40 MG tablet TAKE ONE (1) TABLET EACH DAY 90 tablet 1  . glipiZIDE (GLUCOTROL) 5 MG tablet TAKE 1 TABLET TWICE A DAY BEFORE A MEAL 180 tablet 0  . loratadine (CLARITIN) 10 MG tablet TAKE ONE (1) TABLET EACH DAY 90 tablet 0  . metFORMIN (GLUCOPHAGE) 500 MG tablet Take 1 tablet (500 mg total) by mouth daily. (Patient taking differently: Take 500 mg by mouth. 1/2 tab twice a day) 90 tablet 3  . potassium chloride SA (K-DUR,KLOR-CON) 20 MEQ tablet TAKE TWO TABLETS BY MOUTH DAILY 180 tablet 2  . sulfamethoxazole-trimethoprim (BACTRIM DS) 800-160 MG  tablet Take 1 tablet by mouth 2 (two) times daily for 7 days. 14 tablet 0  . sulfamethoxazole-trimethoprim (BACTRIM DS) 800-160 MG tablet Take 1 tablet by mouth 2 (two) times daily.     No current facility-administered medications on file prior to visit.         Objective:   Physical Exam Blood pressure 118/72, pulse 90, temperature 97.6 F (36.4 C), height 6\' 3"  (1.905 m), weight (!) 300 lb 11.2 oz (136.4 kg). Alert and oriented. Skin warm and dry. Oral mucosa is moist.   . Sclera anicteric, conjunctivae is pink. Thyroid not enlarged. No cervical lymphadenopathy. Lungs clear. Heart regular rate and rhythm.  Abdomen is soft. Bowel sounds are positive. No hepatomegaly. No abdominal masses felt. No tenderness.  No edema to lower extremities.         Assessment & Plan:  Chronic diarrhea. Am going to get a GI pathogen. Further recommendations to follow.  Imodium BID as needed

## 2018-09-12 DIAGNOSIS — K529 Noninfective gastroenteritis and colitis, unspecified: Secondary | ICD-10-CM | POA: Diagnosis not present

## 2018-09-13 ENCOUNTER — Other Ambulatory Visit: Payer: Self-pay | Admitting: Cardiology

## 2018-09-13 ENCOUNTER — Other Ambulatory Visit: Payer: Self-pay | Admitting: Physician Assistant

## 2018-09-13 NOTE — Telephone Encounter (Signed)
Last A1C I see is from 06/2017. Ok to refill?

## 2018-09-16 DIAGNOSIS — K8020 Calculus of gallbladder without cholecystitis without obstruction: Secondary | ICD-10-CM | POA: Diagnosis not present

## 2018-09-17 ENCOUNTER — Other Ambulatory Visit: Payer: Self-pay | Admitting: Physician Assistant

## 2018-09-17 ENCOUNTER — Telehealth: Payer: Self-pay | Admitting: Physician Assistant

## 2018-09-17 MED ORDER — PANTOPRAZOLE SODIUM 40 MG PO TBEC: 40.0000 mg | DELAYED_RELEASE_TABLET | Freq: Every day | ORAL | 5 refills | Status: DC

## 2018-09-17 NOTE — Telephone Encounter (Signed)
Prescription of pantoprazole has been sent to the pharmacy.  It is 1 daily.

## 2018-09-17 NOTE — Telephone Encounter (Signed)
Left detailed message on wife's voicemail

## 2018-09-17 NOTE — Telephone Encounter (Signed)
Please review and advise.

## 2018-09-20 LAB — GASTROINTESTINAL PATHOGEN PANEL PCR
C. difficile Tox A/B, PCR: NOT DETECTED
Campylobacter, PCR: NOT DETECTED
Cryptosporidium, PCR: NOT DETECTED
E coli (ETEC) LT/ST PCR: NOT DETECTED
E coli (STEC) stx1/stx2, PCR: NOT DETECTED
E coli 0157, PCR: NOT DETECTED
Giardia lamblia, PCR: NOT DETECTED
Norovirus, PCR: NOT DETECTED
Rotavirus A, PCR: NOT DETECTED
Salmonella, PCR: NOT DETECTED
Shigella, PCR: NOT DETECTED

## 2018-10-01 ENCOUNTER — Ambulatory Visit: Payer: Medicare Other

## 2018-10-09 ENCOUNTER — Ambulatory Visit: Payer: Medicare Other

## 2018-10-16 DIAGNOSIS — K8020 Calculus of gallbladder without cholecystitis without obstruction: Secondary | ICD-10-CM | POA: Diagnosis not present

## 2018-10-18 ENCOUNTER — Telehealth: Payer: Self-pay | Admitting: Gastroenterology

## 2018-10-18 NOTE — Telephone Encounter (Signed)
Appointment scheduled for 11-19-2018

## 2018-10-18 NOTE — Telephone Encounter (Signed)
Dr. Tarri Glenn, this pt has Asperger Syndrome, and legal guardian [name] (sister) requested you as his GI doctor.  Pt was recently seen 09/2018 at [place].  She requested to transfer care because she said previous GI MD was not patient and empathetic with pt's disability.    Sister reported that pt has frequent BMs, up to 8 times a day, abd p and vomiting.  Will you accept this pt?

## 2018-10-18 NOTE — Telephone Encounter (Signed)
I reviewed the notes from [place]. You may schedule an appointment with me - virtual or inpatient. Thank you.

## 2018-10-25 ENCOUNTER — Ambulatory Visit (INDEPENDENT_AMBULATORY_CARE_PROVIDER_SITE_OTHER): Payer: Medicare Other | Admitting: *Deleted

## 2018-10-25 VITALS — Ht 75.0 in | Wt 300.7 lb

## 2018-10-25 DIAGNOSIS — Z0000 Encounter for general adult medical examination without abnormal findings: Secondary | ICD-10-CM

## 2018-10-25 NOTE — Progress Notes (Signed)
MEDICARE ANNUAL WELLNESS VISIT  10/25/2018  Telephone Visit Disclaimer This Medicare AWV was conducted by telephone due to national recommendations for restrictions regarding the COVID-19 Pandemic (e.g. social distancing).  I verified, using two identifiers, that I am speaking with Michael Ramos or their authorized healthcare agent. I discussed the limitations, risks, security, and privacy concerns of performing an evaluation and management service by telephone and the potential availability of an in-person appointment in the future. The patient expressed understanding and agreed to proceed.   Subjective:  Michael Ramos is a 48 y.o. male patient of Terald Sleeper, PA-C who had a Medicare Annual Wellness Visit today via telephone. Michael Ramos is Legally disabled and lives with their family. he has 0 children. he reports that he is socially active and does interact with friends/family regularly. he is not physically active and enjoys sports and music.  Patient Care Team: Theodoro Clock as PCP - General (Physician Assistant) Arnoldo Lenis, MD as PCP - Cardiology (Cardiology)  Advanced Directives 10/25/2018 12/31/2017 03/06/2017 08/15/2016 08/15/2016 08/15/2016  Does Patient Have a Medical Advance Directive? Yes No No Yes Yes Yes  Type of Advance Directive Healthcare Power of [place]  Does patient want to make changes to medical advance directive? No - Patient declined - - No - Patient declined - -  Copy of [place] in Chart? No - copy requested - - No - copy requested No - copy requested No - copy requested  Would patient like information on creating a medical advance directive? - No - Patient declined No - Patient declined No - Patient declined No - Patient declined No - Patient declined    Hospital Utilization Over the Past 12 Months: # of hospitalizations or ER visits: 1  # of surgeries: 0  Review of Systems    Patient reports that his overall health is unchanged compared to last year.  Patient Reported Readings (BP, Pulse, CBG, Weight, etc) CBG 140  Review of Systems: History obtained from mother General ROS: negative  All other systems negative.  Pain Assessment Pain : No/denies pain     Current Medications & Allergies (verified) Allergies as of 10/25/2018   No Known Allergies     Medication List       Accurate as of October 25, 2018 10:47 AM. If you have any questions, ask your nurse or doctor.        STOP taking these medications   sulfamethoxazole-trimethoprim 800-160 MG tablet Commonly known as: BACTRIM DS     TAKE these medications   carvedilol 25 MG tablet Commonly known as: COREG TAKE ONE TABLET BY MOUTH TWICE DAILY   Entresto 49-51 MG Generic drug: sacubitril-valsartan TAKE ONE TABLET BY MOUTH TWICE DAILY   furosemide 40 MG tablet Commonly known as: LASIX TAKE ONE (1) TABLET EACH DAY   glipiZIDE 5 MG tablet Commonly known as: GLUCOTROL TAKE 1 TABLET TWICE A DAY BEFORE A MEAL   loratadine 10 MG tablet Commonly known as: CLARITIN TAKE ONE (1) TABLET EACH DAY   metFORMIN 500 MG tablet Commonly known as: GLUCOPHAGE Take 1 tablet (500 mg total) by mouth daily. What changed:   when to take this  additional instructions   pantoprazole 40 MG tablet Commonly known as: PROTONIX Take 1 tablet (40 mg total) by mouth daily.   potassium chloride SA 20 MEQ tablet Commonly known as: K-DUR  TAKE TWO TABLETS BY MOUTH DAILY       History (reviewed): Past Medical History:  Diagnosis Date  . Allergy   . Autism   . CHF (congestive heart failure) ([place])    a. EF 20-25% by echo in 08/2016 b. repeat echo in 02/2017 showing EF 30-35% with cath showing normal cors.   . Diabetes mellitus without complication ([place])   . Obesity (BMI 30-39.9) 10/23/2017   Past Surgical History:  Procedure Laterality Date  . RIGHT/LEFT  HEART CATH AND CORONARY ANGIOGRAPHY N/A 03/06/2017   Procedure: RIGHT/LEFT HEART CATH AND CORONARY ANGIOGRAPHY;  Surgeon: Burnell Blanks, MD;  Location: [facility];  Service: Cardiovascular;  Laterality: N/A;   Family History  Problem Relation Age of Onset  . Diabetes Mother   . Cancer Father   . Diabetes Father    Social History   Socioeconomic History  . Marital status: Single    Spouse name: Not on file  . Number of children: Not on file  . Years of education: Not on file  . Highest education level: 12th grade  Occupational History  . Occupation: Disabled  Social Needs  . Financial resource strain: Not hard at all  . Food insecurity    Worry: Never true    Inability: Never true  . Transportation needs    Medical: No    Non-medical: No  Tobacco Use  . Smoking status: Never Smoker  . Smokeless tobacco: Never Used  Substance and Sexual Activity  . Alcohol use: No  . Drug use: No  . Sexual activity: Not Currently  Lifestyle  . Physical activity    Days per week: 0 days    Minutes per session: 0 min  . Stress: Not at all  Relationships  . Social connections    Talks on phone: More than three times a week    Gets together: More than three times a week    Attends religious service: Never    Active member of club or organization: No    Attends meetings of clubs or organizations: Never    Relationship status: Never married  Other Topics Concern  . Not on file  Social History Narrative   Autistic   Lives with Mother    Activities of Daily Living In your present state of health, do you have any difficulty performing the following activities: 10/25/2018 12/31/2017  Hearing? N -  Vision? N -  Difficulty concentrating or making decisions? Y -  Walking or climbing stairs? N -  Dressing or bathing? N -  Doing errands, shopping? Tempie Donning  Preparing Food and eating ? Y -  Using the Toilet? N -  In the past six months, have you accidently leaked urine? N -   Do you have problems with loss of bowel control? N -  Managing your Medications? Y -  Managing your Finances? Y -  Housekeeping or managing your Housekeeping? Y -  Some recent data might be hidden    Patient Education/ Literacy How often do you need to have someone help you when you read instructions, pamphlets, or other written materials from your doctor or pharmacy?: 1 - Never What is the last grade level you completed in school?: 12th Grade  Exercise Current Exercise Habits: The patient does not participate in regular exercise at present  Diet Patient reports consuming 3 meals a day and 1 snack(s) a day Patient reports that his primary diet is: Diabetic Patient reports that she does have  regular access to food.   Depression Screen PHQ 2/9 Scores 10/25/2018 12/06/2017 11/16/2017 07/05/2017 08/25/2016 03/14/2016  PHQ - 2 Score 0 0 0 0 1 -  PHQ- 9 Score - - - - 3 -  Exception Documentation - - - - - Medical reason  Not completed - - - - - Autism      Fall Risk Fall Risk  10/25/2018 12/06/2017 07/05/2017  Falls in the past year? 0 No No  Number falls in past yr: 0 - -  Injury with Fall? 0 - -     Objective:  Michael Ramos seemed alert and oriented and he participated appropriately during our telephone visit.  Blood Pressure Weight BMI  BP Readings from Last 3 Encounters:  09/11/18 118/72  09/09/18 (!) 100/58  02/01/18 118/72   Wt Readings from Last 3 Encounters:  10/25/18 (!) 300 lb 11.2 oz (136.4 kg)  09/11/18 (!) 300 lb 11.2 oz (136.4 kg)  09/09/18 290 lb (131.5 kg)   BMI Readings from Last 1 Encounters:  10/25/18 37.58 kg/m    *Unable to obtain current vital signs, weight, and BMI due to telephone visit type  Hearing/Vision  . Machael did not seem to have difficulty with hearing/understanding during the telephone conversation . Reports that he has not had a formal eye exam by an eye care professional within the past year . Reports that he has not had a formal  hearing evaluation within the past year *Unable to fully assess hearing and vision during telephone visit type  Cognitive Function: 6CIT Screen 10/25/2018  What Year? (No Data)   (Normal:0-7, Significant for Dysfunction: >8)  Normal Cognitive Function Screening: No: Patient autistic   Immunization & Health Maintenance Record Immunization History  Administered Date(s) Administered  . Influenza,inj,Quad PF,6+ Mos 01/18/2016, 01/10/2017, 01/31/2018  . Pneumococcal Polysaccharide-23 01/10/2017    Health Maintenance  Topic Date Due  . FOOT EXAM  01/11/1982  . OPHTHALMOLOGY EXAM  01/11/1982  . URINE MICROALBUMIN  01/11/1982  . TETANUS/TDAP  01/12/1991  . HEMOGLOBIN A1C  01/05/2018  . INFLUENZA VACCINE  11/09/2018  . PNEUMOCOCCAL POLYSACCHARIDE VACCINE AGE 50-64 HIGH RISK  Completed  . HIV Screening  Completed       Assessment  This is a routine wellness examination for Michael Ramos.  Health Maintenance: Due or Overdue Health Maintenance Due  Topic Date Due  . FOOT EXAM  01/11/1982  . OPHTHALMOLOGY EXAM  01/11/1982  . URINE MICROALBUMIN  01/11/1982  . TETANUS/TDAP  01/12/1991  . HEMOGLOBIN A1C  01/05/2018    Michael Ramos does not need a referral for Community Assistance: Care Management:   no Social Work:    no Prescription Assistance:  no Nutrition/Diabetes Education:  no   Plan:  Personalized Goals Goals Addressed            This Visit's Progress   . Exercise 3x per week (30 min per time)       Try to exercise for at least 30 minutes, 3 times weekly       Personalized Health Maintenance & Screening Recommendations  Td vaccine Diabetes screening Glaucoma screening  Lung Cancer Screening Recommended: no (Low Dose CT Chest recommended if Age 61-80 years, 30 pack-year currently smoking OR have quit w/in past 15 years) Hepatitis C Screening recommended: no HIV Screening recommended: no  Advanced Directives: Written information was not prepared  per patient's request.  Referrals & Orders No orders of the defined types were placed in this encounter.  Follow-up Plan . Follow-up with Terald Sleeper, PA-C as planned    I have personally reviewed and noted the following in the patient's chart:   . Medical and social history . Use of alcohol, tobacco or illicit drugs  . Current medications and supplements . Functional ability and status . Nutritional status . Physical activity . Advanced directives . List of other physicians . Hospitalizations, surgeries, and ER visits in previous 12 months . Vitals . Screenings to include cognitive, depression, and falls . Referrals and appointments  In addition, I have reviewed and discussed with Michael Ramos certain preventive protocols, quality metrics, and best practice recommendations. A written personalized care plan for preventive services as well as general preventive health recommendations is available and can be mailed to the patient at his request.      Wardell Heath, LPN      [id number]

## 2018-10-25 NOTE — Patient Instructions (Signed)
Michael Ramos , Thank you for taking time to come for your Medicare Wellness Visit. I appreciate your ongoing commitment to your health goals. Please review the following plan we discussed and let me know if I can assist you in the future.   These are the goals we discussed: Goals    . Exercise 3x per week (30 min per time)     Try to exercise for at least 30 minutes, 3 times weekly        This is a list of the screening recommended for you and due dates:  Health Maintenance  Topic Date Due  . Complete foot exam   01/11/1982  . Eye exam for diabetics  01/11/1982  . Urine Protein Check  01/11/1982  . Tetanus Vaccine  01/12/1991  . Hemoglobin A1C  01/05/2018  . Flu Shot  11/09/2018  . Pneumococcal vaccine  Completed  . HIV Screening  Completed     BMI for Adults  Body mass index (BMI) is a number that is calculated from a person's weight and height. BMI may help to estimate how much of a person's weight is composed of fat. BMI can help identify those who may be at higher risk for certain medical problems. How is BMI used with adults? BMI is used as a screening tool to identify possible weight problems. It is used to check whether a person is obese, overweight, healthy weight, or underweight. How is BMI calculated? BMI measures your weight and compares it to your height. This can be done either in [place] ([place] or metric measurements. Note that charts are available to help you find your BMI quickly and easily without having to do these calculations yourself. To calculate your BMI in English (U.S.) measurements, your health care provider will: 1. Measure your weight in pounds (lb). 2. Multiply the number of pounds by 703. ? For example, for a person who weighs 180 lb, multiply that number by 703, which equals 126,540. 3. Measure your height in inches (in). Then multiply that number by itself to get a measurement called "inches squared." ? For example, for a person who is 70 in tall,  the "inches squared" measurement is 70 in x 70 in, which equals 4900 inches squared. 4. Divide the total from Step 2 (number of lb x 703) by the total from Step 3 (inches squared): 126,540  4900 = 25.8. This is your BMI. To calculate your BMI in metric measurements, your health care provider will: 1. Measure your weight in kilograms (kg). 2. Measure your height in meters (m). Then multiply that number by itself to get a measurement called "meters squared." ? For example, for a person who is 1.75 m tall, the "meters squared" measurement is 1.75 m x 1.75 m, which is equal to 3.1 meters squared. 3. Divide the number of kilograms (your weight) by the meters squared number. In this example: 70  3.1 = 22.6. This is your BMI. How is BMI interpreted? To interpret your results, your health care provider will use BMI charts to identify whether you are underweight, normal weight, overweight, or obese. The following guidelines will be used:  Underweight: BMI less than 18.5.  Normal weight: BMI between 18.5 and 24.9.  Overweight: BMI between 25 and 29.9.  Obese: BMI of 30 and above. Please note:  Weight includes both fat and muscle, so someone with a muscular build, such as an athlete, may have a BMI that is higher than 24.9. In cases like these, BMI  is not an accurate measure of body fat.  To determine if excess body fat is the cause of a BMI of 25 or higher, further assessments may need to be done by a health care provider.  BMI is usually interpreted in the same way for men and women. Why is BMI a useful tool? BMI is useful in two ways:  Identifying a weight problem that may be related to a medical condition, or that may increase the risk for medical problems.  Promoting lifestyle and diet changes in order to reach a healthy weight. Summary  Body mass index (BMI) is a number that is calculated from a person's weight and height.  BMI may help to estimate how much of a person's weight is  composed of fat. BMI can help identify those who may be at higher risk for certain medical problems.  BMI can be measured using English measurements or metric measurements.  To interpret your results, your health care provider will use BMI charts to identify whether you are underweight, normal weight, overweight, or obese. This information is not intended to replace advice given to you by your health care provider. Make sure you discuss any questions you have with your health care provider. Document Released: 12/07/2003 Document Revised: 03/09/2017 Document Reviewed: 02/07/2017 Elsevier Patient Education  2020 [employer].

## 2018-11-04 ENCOUNTER — Other Ambulatory Visit: Payer: Self-pay | Admitting: Physician Assistant

## 2018-11-05 ENCOUNTER — Other Ambulatory Visit: Payer: Self-pay | Admitting: Physician Assistant

## 2018-11-06 ENCOUNTER — Telehealth: Payer: Self-pay | Admitting: Physician Assistant

## 2018-11-06 MED ORDER — METFORMIN HCL 500 MG PO TABS: 250.0000 mg | ORAL_TABLET | Freq: Two times a day (BID) | ORAL | 0 refills | Status: DC

## 2018-11-06 NOTE — Telephone Encounter (Signed)
What is the name of the medication? metformin  Have you contacted your pharmacy to request a refill? Yes but pt ntbs schedule next available for 11/12/2018 remote with [facility]. Pt will have medication for about 3 more days and is it safe to wait till apt w/out med. If no can something be called in.  Which pharmacy would you like this sent [facility]   Patient notified that their request is being sent to the clinical staff for review and that they should receive a call once it is complete. If they do not receive a call within 24 hours they can check with their pharmacy or our office.

## 2018-11-06 NOTE — Telephone Encounter (Signed)
Detailed message left that rx sent to [facility] [place].

## 2018-11-07 ENCOUNTER — Other Ambulatory Visit: Payer: Self-pay

## 2018-11-07 ENCOUNTER — Ambulatory Visit (INDEPENDENT_AMBULATORY_CARE_PROVIDER_SITE_OTHER): Payer: Medicare Other | Admitting: Cardiology

## 2018-11-07 ENCOUNTER — Encounter: Payer: Self-pay | Admitting: Cardiology

## 2018-11-07 VITALS — BP 95/63 | HR 90 | Temp 97.7°F | Ht 75.0 in | Wt 301.0 lb

## 2018-11-07 DIAGNOSIS — I5022 Chronic systolic (congestive) heart failure: Secondary | ICD-10-CM | POA: Diagnosis not present

## 2018-11-07 DIAGNOSIS — I428 Other cardiomyopathies: Secondary | ICD-10-CM | POA: Diagnosis not present

## 2018-11-07 MED ORDER — FUROSEMIDE 20 MG PO TABS: 20.0000 mg | ORAL_TABLET | Freq: Every day | ORAL | 3 refills | Status: DC

## 2018-11-07 NOTE — Patient Instructions (Signed)
Medication Instructions:  Your physician has recommended you make the following change in your medication:  Decrease Lasix to 20 mg Daily    If you need a refill on your cardiac medications before your next appointment, please call your pharmacy.   Lab work: NONE  If you have labs (blood work) drawn today and your tests are completely normal, you will receive your results only by: Marland Kitchen MyChart Message (if you have MyChart) OR . A paper copy in the mail If you have any lab test that is abnormal or we need to change your treatment, we will call you to review the results.  Testing/Procedures: NONE   Follow-Up: At [facility], you and your health needs are our priority.  As part of our continuing mission to provide you with exceptional heart care, we have created designated Provider Care Teams.  These Care Teams include your primary Cardiologist (physician) and Advanced Practice Providers (APPs -  Physician Assistants and Nurse Practitioners) who all work together to provide you with the care you need, when you need it. You will need a follow up appointment in 4 months.  Please call our office 2 months in advance to schedule this appointment.  You may see Carlyle Dolly, MD or one of the following Advanced Practice Providers on your designated Care Team:   Bernerd Pho, PA-C [facility]) . Ermalinda Barrios, PA-C ([place])  Any Other Special Instructions Will Be Listed Below (If Applicable). Thank you for choosing [facility]!

## 2018-11-07 NOTE — Progress Notes (Signed)
Clinical Summary Mr. Careaga is a 48 y.o.male seen today for follow up of the following medical problems.  History today is obtained from patient and his sister due to patient's history of autism.    1. Chronic systolic HF - new diagnosis during 08/2016 admission with fluid overload - 02/2017 cath no significant CAD.  07/2017 echo LVEF 35-40%, grade II diastolic dysfunction   - no recent SOB/DOE. No recent edema - compliant with meds   2. Nocturnal hypoxia/Possible sleep apnea - followed by Dr Radford Pax - drop in O2 sats noted during home oximetry. There were discussions about a formal sleep study however family does not feel patient could cooperate with study due to his auticsion   3. Diarrhea Appt with GI for diarrhea and loose stools coming up - no significant lightheadedness or dizziness. Did have what sounds like a vagal episode while straining heavily.      Has 7 brothers and sisters.  Past Medical History:  Diagnosis Date  . Allergy   . Autism   . CHF (congestive heart failure) ([place])    a. EF 20-25% by echo in 08/2016 b. repeat echo in 02/2017 showing EF 30-35% with cath showing normal cors.   . Diabetes mellitus without complication ([place])   . Obesity (BMI 30-39.9) 10/23/2017     No Known Allergies   Current Outpatient Medications  Medication Sig Dispense Refill  . carvedilol (COREG) 25 MG tablet TAKE ONE TABLET BY MOUTH TWICE DAILY 180 tablet 3  . ENTRESTO 49-51 MG TAKE ONE TABLET BY MOUTH TWICE DAILY 60 tablet 6  . furosemide (LASIX) 40 MG tablet TAKE ONE (1) TABLET EACH DAY 90 tablet 1  . glipiZIDE (GLUCOTROL) 5 MG tablet TAKE 1 TABLET TWICE A DAY BEFORE A MEAL 180 tablet 0  . loratadine (CLARITIN) 10 MG tablet TAKE ONE (1) TABLET EACH DAY 90 tablet 0  . metFORMIN (GLUCOPHAGE) 500 MG tablet Take 0.5 tablets (250 mg total) by mouth 2 (two) times daily with a meal. 30 tablet 0  . pantoprazole (PROTONIX) 40 MG tablet Take 1 tablet (40 mg total) by  mouth daily. 30 tablet 5  . potassium chloride SA (K-DUR,KLOR-CON) 20 MEQ tablet TAKE TWO TABLETS BY MOUTH DAILY 180 tablet 2   No current facility-administered medications for this visit.      Past Surgical History:  Procedure Laterality Date  . RIGHT/LEFT HEART CATH AND CORONARY ANGIOGRAPHY N/A 03/06/2017   Procedure: RIGHT/LEFT HEART CATH AND CORONARY ANGIOGRAPHY;  Surgeon: Burnell Blanks, MD;  Location: [facility];  Service: Cardiovascular;  Laterality: N/A;     No Known Allergies    Family History  Problem Relation Age of Onset  . Diabetes Mother   . Cancer Father   . Diabetes Father      Social History Mr. Millspaugh reports that he has never smoked. He has never used smokeless tobacco. Mr. Hinderman reports no history of alcohol use.   Review of Systems CONSTITUTIONAL: No weight loss, fever, chills, weakness or fatigue.  HEENT: Eyes: No visual loss, blurred vision, double vision or yellow sclerae.No hearing loss, sneezing, congestion, runny nose or sore throat.  SKIN: No rash or itching.  CARDIOVASCULAR: per hpi RESPIRATORY: No shortness of breath, cough or sputum.  GASTROINTESTINAL: No anorexia, nausea, vomiting or diarrhea. No abdominal pain or blood.  GENITOURINARY: No burning on urination, no polyuria NEUROLOGICAL: No headache, dizziness, syncope, paralysis, ataxia, numbness or tingling in the extremities. No change in bowel or  bladder control.  MUSCULOSKELETAL: No muscle, back pain, joint pain or stiffness.  LYMPHATICS: No enlarged nodes. No history of splenectomy.  PSYCHIATRIC: No history of depression or anxiety.  ENDOCRINOLOGIC: No reports of sweating, cold or heat intolerance. No polyuria or polydipsia.  Marland Kitchen   Physical Examination Today's Vitals   11/07/18 1317  BP: 95/63  Pulse: 90  Temp: 97.7 F (36.5 C)  Weight: (!) 301 lb (136.5 kg)  Height: 6\' 3"  (1.905 m)   Body mass index is 37.62 kg/m.  Gen: resting comfortably, no acute  distress HEENT: no scleral icterus, pupils equal round and reactive, no palptable cervical adenopathy,  CV: RRR, no m/r/g, no jvd Resp: Clear to auscultation bilaterally GI: abdomen is soft, non-tender, non-distended, normal bowel sounds, no hepatosplenomegaly MSK: extremities are warm, no edema.  Skin: warm, no rash Neuro:  no focal deficits Psych: appropriate affect   Diagnostic Studies  02/2017 cath  Hemodynamic findings consistent with mild pulmonary hypertension.  1. No angiographic evidence of CAD 2. Non-ischemic cardiomyopathy  Recommendations: medical management of cardiomyopathy and CHF.   02/2017 echo Study Conclusions  - Left ventricle: The cavity size was mildly dilated. Wall thickness was normal. Systolic function was moderately to severely reduced. The estimated ejection fraction was in the range of 30% to 35%. Diffuse hypokinesis. Diastolic dysfunction, grade indeterminate. Indeterminate filling pressures. - Mitral valve: There was mild regurgitation.   07/2017 echo Study Conclusions  - Left ventricle: The cavity size was mildly dilated. Wall thickness was normal. Systolic function was moderately reduced. The estimated ejection fraction was in the range of 35% to 40%. Diffuse hypokinesis. Features are consistent with a pseudonormal left ventricular filling pattern, with concomitant abnormal relaxation and increased filling pressure (grade 2 diastolic dysfunction). - Mitral valve: There was mild regurgitation.   Assessment and Plan   1. Chronic systolic HF/ NICM - LVEF 51-02%, NYHA II.He will not require and ICD -medical therapy limited by his renal function with mild uptredn in Cr previously, also soft bp's today - continue current regimen. Due to diarrhea and soft bp's lower lasix to 20mg  daily.       Arnoldo Lenis, M.D.,

## 2018-11-12 ENCOUNTER — Encounter: Payer: Self-pay | Admitting: Physician Assistant

## 2018-11-12 ENCOUNTER — Ambulatory Visit (INDEPENDENT_AMBULATORY_CARE_PROVIDER_SITE_OTHER): Payer: Medicare Other | Admitting: Physician Assistant

## 2018-11-12 DIAGNOSIS — I5021 Acute systolic (congestive) heart failure: Secondary | ICD-10-CM

## 2018-11-12 DIAGNOSIS — J3089 Other allergic rhinitis: Secondary | ICD-10-CM

## 2018-11-12 DIAGNOSIS — E119 Type 2 diabetes mellitus without complications: Secondary | ICD-10-CM | POA: Diagnosis not present

## 2018-11-12 DIAGNOSIS — I10 Essential (primary) hypertension: Secondary | ICD-10-CM

## 2018-11-12 MED ORDER — GLIPIZIDE 5 MG PO TABS: ORAL_TABLET | ORAL | 3 refills | Status: DC

## 2018-11-12 MED ORDER — LORATADINE 10 MG PO TABS: ORAL_TABLET | ORAL | 3 refills | Status: DC

## 2018-11-12 NOTE — Progress Notes (Signed)
Telephone visit  Subjective: CC: Recheck on chronic conditions PCP: Terald Sleeper, PA-C GPQ:DIYMEBR Michael Ramos is a 48 y.o. male calls for telephone consult today. Patient provides verbal consent for consult held via phone.  Patient is identified with 2 separate identifiers.  At this time the entire area is on COVID-19 social distancing and stay home orders are in place.  Patient is of higher risk and therefore we are performing this by a virtual method.  Location of patient: Home Location of provider: HOME Others present for call: Mother, Michael Ramos  This is a periodic recheck on the patient's chronic medical conditions which do include hypertension, heart failure, allergic rhinitis, type 2 diabetes.  A lab order has been prepared.  Unable, in the next few weeks to have labs performed.  He however he is having some diarrhea eat even on 1/2 tablet of metformin.  Therefore we are going to stop it and see if this will eventually fade away.  If it does not they will let us know.  However if it is better they can let us know and we can plan to add a different medication to hopefully not cause diarrhea again.  He is continuing appointments with his specialist.  Those are coming up in the near future.  Otherwise he has been doing quite well.  There are no complaints at this time.   ROS: Per HPI  No Known Allergies Past Medical History:  Diagnosis Date  . Allergy   . Autism   . CHF (congestive heart failure) ([place])    a. EF 20-25% by echo in 08/2016 b. repeat echo in 02/2017 showing EF 30-35% with cath showing normal cors.   . Diabetes mellitus without complication ([place])   . Obesity (BMI 30-39.9) 10/23/2017    Current Outpatient Medications:  .  carvedilol (COREG) 25 MG tablet, TAKE ONE TABLET BY MOUTH TWICE DAILY, Disp: 180 tablet, Rfl: 3 .  ENTRESTO 49-51 MG, TAKE ONE TABLET BY MOUTH TWICE DAILY, Disp: 60 tablet, Rfl: 6 .  furosemide (LASIX) 20 MG tablet, Take 1 tablet (20 mg  total) by mouth daily., Disp: 90 tablet, Rfl: 3 .  glipiZIDE (GLUCOTROL) 5 MG tablet, TAKE 1 TABLET TWICE A DAY BEFORE A MEAL, Disp: 180 tablet, Rfl: 3 .  loratadine (CLARITIN) 10 MG tablet, TAKE ONE (1) TABLET EACH DAY, Disp: 90 tablet, Rfl: 3 .  metFORMIN (GLUCOPHAGE) 500 MG tablet, Take 0.5 tablets (250 mg total) by mouth 2 (two) times daily with a meal., Disp: 30 tablet, Rfl: 0 .  pantoprazole (PROTONIX) 40 MG tablet, Take 1 tablet (40 mg total) by mouth daily., Disp: 30 tablet, Rfl: 5 .  potassium chloride SA (K-DUR,KLOR-CON) 20 MEQ tablet, TAKE TWO TABLETS BY MOUTH DAILY, Disp: 180 tablet, Rfl: 2  Assessment/ Plan: 48 y.o. male   1. Acute systolic CHF (congestive heart failure) (HCC) - CBC with Differential/Platelet; Future - CMP14+EGFR; Future - Lipid panel; Future - TSH; Future - Bayer DCA Hb A1c Waived; Future  2. Type 2 diabetes mellitus without complication, without long-term current use of insulin (HCC) - glipiZIDE (GLUCOTROL) 5 MG tablet; TAKE 1 TABLET TWICE A DAY BEFORE A MEAL  Dispense: 180 tablet; Refill: 3 - Lipid panel; Future - Bayer DCA Hb A1c Waived; Future  3. Essential hypertension  - CBC with Differential/Platelet; Future - Lipid panel; Future - Bayer DCA Hb A1c Waived; Future  4. Chronic nonseasonal allergic rhinitis due to pollen - loratadine (CLARITIN) 10 MG tablet; TAKE  ONE (1) TABLET EACH DAY  Dispense: 90 tablet; Refill: 3   No follow-ups on file.  Continue all other maintenance medications as listed above.  Start time: 10:28 AM End time: 10:38 AM  Meds ordered this encounter  Medications  . loratadine (CLARITIN) 10 MG tablet    Sig: TAKE ONE (1) TABLET EACH DAY    Dispense:  90 tablet    Refill:  3    Order Specific Question:   Supervising Provider    Answer:   Janora Norlander [[id number]]  . glipiZIDE (GLUCOTROL) 5 MG tablet    Sig: TAKE 1 TABLET TWICE A DAY BEFORE A MEAL    Dispense:  180 tablet    Refill:  3    Order Specific  Question:   Supervising Provider    Answer:   Janora Norlander [[id number]]    Michael Ramos [phone]

## 2018-11-15 ENCOUNTER — Other Ambulatory Visit: Payer: Medicare Other

## 2018-11-15 ENCOUNTER — Other Ambulatory Visit: Payer: Self-pay

## 2018-11-15 DIAGNOSIS — I5021 Acute systolic (congestive) heart failure: Secondary | ICD-10-CM

## 2018-11-15 DIAGNOSIS — E119 Type 2 diabetes mellitus without complications: Secondary | ICD-10-CM | POA: Diagnosis not present

## 2018-11-15 DIAGNOSIS — I10 Essential (primary) hypertension: Secondary | ICD-10-CM

## 2018-11-15 LAB — BAYER DCA HB A1C WAIVED: HB A1C (BAYER DCA - WAIVED): 8 % — ABNORMAL HIGH (ref <7.0)

## 2018-11-15 LAB — LIPID PANEL
Chol/HDL Ratio: 3.3 ratio (ref 0.0–5.0)
Cholesterol, Total: 126 mg/dL (ref 100–199)
HDL: 38 mg/dL — ABNORMAL LOW (ref >39)
LDL Calculated: 30 mg/dL (ref 0–99)
Triglycerides: 292 mg/dL — ABNORMAL HIGH (ref 0–149)
VLDL Cholesterol Cal: 58 mg/dL — ABNORMAL HIGH (ref 5–40)

## 2018-11-16 LAB — CMP14+EGFR
ALT: 30 IU/L (ref 0–44)
AST: 23 IU/L (ref 0–40)
Albumin/Globulin Ratio: 1.5 (ref 1.2–2.2)
Albumin: 3.9 g/dL — ABNORMAL LOW (ref 4.0–5.0)
Alkaline Phosphatase: 37 IU/L — ABNORMAL LOW (ref 39–117)
BUN/Creatinine Ratio: 11 (ref 9–20)
BUN: 17 mg/dL (ref 6–24)
Bilirubin Total: 0.3 mg/dL (ref 0.0–1.2)
CO2: 23 mmol/L (ref 20–29)
Calcium: 9.6 mg/dL (ref 8.7–10.2)
Chloride: 101 mmol/L (ref 96–106)
Creatinine, Ser: 1.61 mg/dL — ABNORMAL HIGH (ref 0.76–1.27)
GFR calc Af Amer: 58 mL/min/{1.73_m2} — ABNORMAL LOW (ref >59)
GFR calc non Af Amer: 51 mL/min/{1.73_m2} — ABNORMAL LOW (ref >59)
Globulin, Total: 2.6 g/dL (ref 1.5–4.5)
Glucose: 194 mg/dL — ABNORMAL HIGH (ref 65–99)
Potassium: 5.6 mmol/L — ABNORMAL HIGH (ref 3.5–5.2)
Sodium: 138 mmol/L (ref 134–144)
Total Protein: 6.5 g/dL (ref 6.0–8.5)

## 2018-11-16 LAB — CBC WITH DIFFERENTIAL/PLATELET
Basophils Absolute: 0.1 10*3/uL (ref 0.0–0.2)
Basos: 1 %
EOS (ABSOLUTE): 0.2 10*3/uL (ref 0.0–0.4)
Eos: 3 %
Hematocrit: 41.6 % (ref 37.5–51.0)
Hemoglobin: 13.9 g/dL (ref 13.0–17.7)
Immature Grans (Abs): 0.1 10*3/uL (ref 0.0–0.1)
Immature Granulocytes: 1 %
Lymphocytes Absolute: 1.4 10*3/uL (ref 0.7–3.1)
Lymphs: 17 %
MCH: 29 pg (ref 26.6–33.0)
MCHC: 33.4 g/dL (ref 31.5–35.7)
MCV: 87 fL (ref 79–97)
Monocytes Absolute: 0.7 10*3/uL (ref 0.1–0.9)
Monocytes: 8 %
Neutrophils Absolute: 6 10*3/uL (ref 1.4–7.0)
Neutrophils: 70 %
Platelets: 216 10*3/uL (ref 150–450)
RBC: 4.8 x10E6/uL (ref 4.14–5.80)
RDW: 13 % (ref 11.6–15.4)
WBC: 8.5 10*3/uL (ref 3.4–10.8)

## 2018-11-16 LAB — TSH: TSH: 2.23 u[IU]/mL (ref 0.450–4.500)

## 2018-11-18 NOTE — Progress Notes (Signed)
Referring Provider: Terald Sleeper, PA-C Primary Care Physician:  Terald Sleeper, PA-C  Reason for Consultation: Bowel movements, abdominal pain   IMPRESSION:  Diarrhea x 4-6 months    - GI pathogen panel including negative C Diff and Giardia    - normal TSH    -Not improving despite discontinuation of metformin Hematemesis x 1 during episode of acute abdominal pain without anemia Cholelithiasis by ultrasound Echogenic liver by ultrasound  The differential diagnosis of chronic diarrhea without alarm features includes: irritable bowel syndrome, IBD, celiac disease, missed infection (such as giardia), food intolerance or allergies, microscopic colitis, SIBO, other functional GI disease. By history, this is less likely to be obstruction.    PLAN: - Obtain records from recent surgical consultation - Fecal calprotectin, ESR, and CRP to screen for IBD - Avoid carbonated beverages, artificial sweeteners, and dairy to minimize symptoms - EGD with duodenal biopsies  - Colonoscopy with random biopsies and evaluation of the terminal ileum  I consented the patient and his sisters today discussing the risks, benefits, and alternatives to endoscopic evaluation. In particular, we discussed the risks that include, but are not limited to, reaction to medication, cardiopulmonary compromise, bleeding requiring blood transfusion, aspiration resulting in pneumonia, perforation requiring surgery, and even death. We reviewed the risk of missed lesion including polyps or even cancer. The patient acknowledges these risks and asks that we proceed.  Please see the "Patient Instructions" section for addition details about the plan.  HPI: Michael Ramos is a 48 y.o. male who requested a second opinion for a recent change in bowel habits. The history is obtained through the patient, review of his electronic health record, and his sister who accompanies him to this appointment. His other sister participates by  speakerphone. He has Asperger' Syndromes and his sisters' provide his care. He has hypertension, heart failure, allergic rhinitis, type 2 diabetes. Echo 08/07/17 showed LVEF of 35-40%.  He was recently seen by NP Setzer with the [facility] 09/11/18.   Has 4-5 loose stools for 4-6 months, if not longer. Some associated urgency. No abdominal pain. No change in symptoms with eating, defecation, or movement.   No blood or mucous. His appetite is good. No weight loss. He has recently gained weight.  No change after discontinuation of the metformin. Takes Imodium as needed for his diarrhea. Is not using Imodium daily. No other associated symptoms. No identified exacerbating or relieving features.   On Bactrim for recent UTI. Denies a precipitating event, trauma, close contacts with similar symptoms, changes in diet, recent travel.   He has had two episodes were he vasovagal requiring ED evaluation. Severe, diffuse abdominal pain during those episodes. Awoke him from sleep both times.  During one of the incidents 09/09/18 he was spitting up blood.   His sister is worried about a hiatal hernia or ulcer. She feels like his stomach is too hard and it's "hard like a rock."   Saw surgery who recommended fiber in the diet.   GI pathogen panel was negative 09/12/2018 including studies for C. difficile and Giardia.   Labs from 11/15/2018 show a normal TSH of 2.23.  CBC was normal.  BUN 17, creatinine 1.61, potassium 5.6, glucose 194.  Abdominal ultrasound 09/10/2018 showed iMPRESSION: 1. Limited exam due to overlying bowel gas. The gallbladder appears to be full of debris and stones with mild gallbladder wall thickening at 3 mm. Cholecystitis cannot be excluded.  2. Increased hepatic echogenicity consistent fatty infiltration  or hepatocellular disease.  3.  Bilateral renal cortical thinning.  Father with bladder cancer. No known family history of colon cancer or polyps. No  family history of uterine/endometrial cancer, pancreatic cancer or gastric/stomach cancer.   Past Medical History:  Diagnosis Date  . Allergy   . Autism   . CHF (congestive heart failure) ([place])    a. EF 20-25% by echo in 08/2016 b. repeat echo in 02/2017 showing EF 30-35% with cath showing normal cors.   . Diabetes mellitus without complication ([place])   . Obesity (BMI 30-39.9) 10/23/2017    Past Surgical History:  Procedure Laterality Date  . RIGHT/LEFT HEART CATH AND CORONARY ANGIOGRAPHY N/A 03/06/2017   Procedure: RIGHT/LEFT HEART CATH AND CORONARY ANGIOGRAPHY;  Surgeon: Burnell Blanks, MD;  Location: [facility];  Service: Cardiovascular;  Laterality: N/A;    Prior to Admission medications   Medication Sig Start Date End Date Taking? Authorizing Provider  carvedilol (COREG) 25 MG tablet TAKE ONE TABLET BY MOUTH TWICE DAILY 08/21/18   Arnoldo Lenis, MD  ENTRESTO 49-51 MG TAKE ONE TABLET BY MOUTH TWICE DAILY 08/22/18   Arnoldo Lenis, MD  furosemide (LASIX) 20 MG tablet Take 1 tablet (20 mg total) by mouth daily. 11/07/18 02/05/19  Arnoldo Lenis, MD  glipiZIDE (GLUCOTROL) 5 MG tablet TAKE 1 TABLET TWICE A DAY BEFORE A MEAL 11/12/18   Terald Sleeper, PA-C  loratadine (CLARITIN) 10 MG tablet TAKE ONE (1) TABLET EACH DAY 11/12/18   Terald Sleeper, PA-C  metFORMIN (GLUCOPHAGE) 500 MG tablet Take 0.5 tablets (250 mg total) by mouth 2 (two) times daily with a meal. 11/06/18   Terald Sleeper, PA-C  pantoprazole (PROTONIX) 40 MG tablet Take 1 tablet (40 mg total) by mouth daily. 09/17/18   Terald Sleeper, PA-C  potassium chloride SA (K-DUR,KLOR-CON) 20 MEQ tablet TAKE TWO TABLETS BY MOUTH DAILY 06/06/18   Arnoldo Lenis, MD    Current Outpatient Medications  Medication Sig Dispense Refill  . carvedilol (COREG) 25 MG tablet TAKE ONE TABLET BY MOUTH TWICE DAILY 180 tablet 3  . ENTRESTO 49-51 MG TAKE ONE TABLET BY MOUTH TWICE DAILY 60 tablet 6  . furosemide (LASIX) 20 MG  tablet Take 1 tablet (20 mg total) by mouth daily. 90 tablet 3  . glipiZIDE (GLUCOTROL) 5 MG tablet TAKE 1 TABLET TWICE A DAY BEFORE A MEAL 180 tablet 3  . loratadine (CLARITIN) 10 MG tablet TAKE ONE (1) TABLET EACH DAY 90 tablet 3  . metFORMIN (GLUCOPHAGE) 500 MG tablet Take 0.5 tablets (250 mg total) by mouth 2 (two) times daily with a meal. 30 tablet 0  . pantoprazole (PROTONIX) 40 MG tablet Take 1 tablet (40 mg total) by mouth daily. 30 tablet 5  . potassium chloride SA (K-DUR,KLOR-CON) 20 MEQ tablet TAKE TWO TABLETS BY MOUTH DAILY 180 tablet 2   No current facility-administered medications for this visit.     Allergies as of 11/19/2018  . (No Known Allergies)    Family History  Problem Relation Age of Onset  . Diabetes Mother   . Cancer Father   . Diabetes Father     Social History   Socioeconomic History  . Marital status: Single    Spouse name: Not on file  . Number of children: Not on file  . Years of education: Not on file  . Highest education level: 12th grade  Occupational History  . Occupation: Disabled  Social Needs  . Financial  resource strain: Not hard at all  . Food insecurity    Worry: Never true    Inability: Never true  . Transportation needs    Medical: No    Non-medical: No  Tobacco Use  . Smoking status: Never Smoker  . Smokeless tobacco: Never Used  Substance and Sexual Activity  . Alcohol use: No  . Drug use: No  . Sexual activity: Not Currently  Lifestyle  . Physical activity    Days per week: 0 days    Minutes per session: 0 min  . Stress: Not at all  Relationships  . Social connections    Talks on phone: More than three times a week    Gets together: More than three times a week    Attends religious service: Never    Active member of club or organization: No    Attends meetings of clubs or organizations: Never    Relationship status: Never married  . Intimate partner violence    Fear of current or ex partner: No    Emotionally  abused: No    Physically abused: No    Forced sexual activity: No  Other Topics Concern  . Not on file  Social History Narrative   Autistic   Lives with Mother    Review of Systems: 12 system ROS is negative except as noted above.   Physical Exam: General:   Alert,  well-nourished, pleasant and cooperative in NAD Head:  Normocephalic and atraumatic. Eyes:  Sclera clear, no icterus.   Conjunctiva pink. Ears:  Normal auditory acuity. Nose:  No deformity, discharge,  or lesions. Mouth:  No deformity or lesions.   Neck:  Supple; no masses or thyromegaly. Lungs:  Clear throughout to auscultation.   No wheezes. Heart:  Regular rate and rhythm; no murmurs. Abdomen:  Firn, ventral hernia,nontender, nondistended, normal bowel sounds, no rebound or guarding. No hepatosplenomegaly.   Rectal:  Deferred  Msk:  Symmetrical. No boney deformities LAD: No inguinal or umbilical LAD Extremities:  No clubbing or edema. Neurologic:  Alert and  oriented x4;  grossly nonfocal Skin:  Intact without significant lesions or rashes. Psych:  Alert and cooperative. Normal mood and affect.    Chrysta Fulcher L. Tarri Glenn, MD, MPH 11/18/2018, 5:02 PM

## 2018-11-19 ENCOUNTER — Ambulatory Visit (INDEPENDENT_AMBULATORY_CARE_PROVIDER_SITE_OTHER): Payer: Medicare Other | Admitting: Gastroenterology

## 2018-11-19 ENCOUNTER — Encounter: Payer: Self-pay | Admitting: Gastroenterology

## 2018-11-19 ENCOUNTER — Other Ambulatory Visit (INDEPENDENT_AMBULATORY_CARE_PROVIDER_SITE_OTHER): Payer: Medicare Other

## 2018-11-19 VITALS — BP 120/70 | HR 78 | Temp 98.6°F | Ht 75.0 in | Wt 297.6 lb

## 2018-11-19 DIAGNOSIS — K529 Noninfective gastroenteritis and colitis, unspecified: Secondary | ICD-10-CM

## 2018-11-19 LAB — SEDIMENTATION RATE: Sed Rate: 48 mm/hr — ABNORMAL HIGH (ref 0–15)

## 2018-11-19 LAB — C-REACTIVE PROTEIN: CRP: 1.5 mg/dL (ref 0.5–20.0)

## 2018-11-19 MED ORDER — NA SULFATE-K SULFATE-MG SULF 17.5-3.13-1.6 GM/177ML PO SOLN: 1.0000 | ORAL | 0 refills | Status: DC

## 2018-11-19 NOTE — Patient Instructions (Signed)
Avoid carbonated beverages, artificial sweeteners and dairy.   Your provider has requested that you go to the basement level for lab work before leaving today. Press "B" on the elevator. The lab is located at the first door on the left as you exit the elevator.  You have been scheduled for an endoscopy and colonoscopy. Please follow the written instructions given to you at your visit today. Please pick up your prep supplies at the pharmacy within the next 1-3 days. If you use inhalers (even only as needed), please bring them with you on the day of your procedure.  Tips for colonoscopy:  -STAY WELL HYDRATED FOR 3-4 DAYS PRIOR TO THE EXAM. This reduces nausea and dehydration.  -TO PREVENT SKIN/HEMORRHOID IRRITATION- prior to wiping, put A&Dointment or vaseline on the toilet paper. -Keep a towel or pad on the bed.  -DRINK 64oz of clear liquids in the morning of prep day (PRIOR TO STARTING THE PREP) to be sure that there is enough fluid to flush the colon and stay hydrated!!!! This is in addition to the fluids required for preparation.  Thank you for trusting me with your gastrointestinal care!    Thornton Park, MD, MPH

## 2018-11-20 ENCOUNTER — Encounter: Payer: Self-pay | Admitting: Gastroenterology

## 2018-11-20 ENCOUNTER — Other Ambulatory Visit: Payer: Self-pay

## 2018-11-20 ENCOUNTER — Ambulatory Visit (AMBULATORY_SURGERY_CENTER): Payer: Medicare Other | Admitting: Gastroenterology

## 2018-11-20 VITALS — BP 123/78 | HR 79 | Temp 97.8°F | Resp 16 | Ht 75.0 in | Wt 297.0 lb

## 2018-11-20 DIAGNOSIS — K3189 Other diseases of stomach and duodenum: Secondary | ICD-10-CM

## 2018-11-20 DIAGNOSIS — K580 Irritable bowel syndrome with diarrhea: Secondary | ICD-10-CM | POA: Diagnosis not present

## 2018-11-20 DIAGNOSIS — K5730 Diverticulosis of large intestine without perforation or abscess without bleeding: Secondary | ICD-10-CM | POA: Diagnosis not present

## 2018-11-20 DIAGNOSIS — K529 Noninfective gastroenteritis and colitis, unspecified: Secondary | ICD-10-CM

## 2018-11-20 DIAGNOSIS — R1084 Generalized abdominal pain: Secondary | ICD-10-CM

## 2018-11-20 MED ORDER — SODIUM CHLORIDE 0.9 % IV SOLN: 500.0000 mL | Freq: Once | INTRAVENOUS | Status: DC

## 2018-11-20 NOTE — Progress Notes (Signed)
Called to room to assist during endoscopic procedure.  Patient ID and intended procedure confirmed with present staff. Received instructions for my participation in the procedure from the performing physician.  

## 2018-11-20 NOTE — Patient Instructions (Signed)
Please read handouts provided. Continue present medications. Await pathology results.      YOU HAD AN ENDOSCOPIC PROCEDURE TODAY AT THE [facility]:   Refer to the procedure report that was given to you for any specific questions about what was found during the examination.  If the procedure report does not answer your questions, please call your gastroenterologist to clarify.  If you requested that your care partner not be given the details of your procedure findings, then the procedure report has been included in a sealed envelope for you to review at your convenience later.  YOU SHOULD EXPECT: Some feelings of bloating in the abdomen. Passage of more gas than usual.  Walking can help get rid of the air that was put into your GI tract during the procedure and reduce the bloating. If you had a lower endoscopy (such as a colonoscopy or flexible sigmoidoscopy) you may notice spotting of blood in your stool or on the toilet paper. If you underwent a bowel prep for your procedure, you may not have a normal bowel movement for a few days.  Please Note:  You might notice some irritation and congestion in your nose or some drainage.  This is from the oxygen used during your procedure.  There is no need for concern and it should clear up in a day or so.  SYMPTOMS TO REPORT IMMEDIATELY:   Following lower endoscopy (colonoscopy or flexible sigmoidoscopy):  Excessive amounts of blood in the stool  Significant tenderness or worsening of abdominal pains  Swelling of the abdomen that is new, acute  Fever of 100F or higher   Following upper endoscopy (EGD)  Vomiting of blood or coffee ground material  New chest pain or pain under the shoulder blades  Painful or persistently difficult swallowing  New shortness of breath  Fever of 100F or higher  Black, tarry-looking stools  For urgent or emergent issues, a gastroenterologist can be reached at any hour by calling [phone]  [phone].   DIET:  We do recommend a small meal at first, but then you may proceed to your regular diet.  Drink plenty of fluids but you should avoid alcoholic beverages for 24 hours.  ACTIVITY:  You should plan to take it easy for the rest of today and you should NOT DRIVE or use heavy machinery until tomorrow (because of the sedation medicines used during the test).    FOLLOW UP: Our staff will call the number listed on your records 48-72 hours following your procedure to check on you and address any questions or concerns that you may have regarding the information given to you following your procedure. If we do not reach you, we will leave a message.  We will attempt to reach you two times.  During this call, we will ask if you have developed any symptoms of COVID 19. If you develop any symptoms (ie: fever, flu-like symptoms, shortness of breath, cough etc.) before then, please call [phone].  If you test positive for Covid 19 in the 2 weeks post procedure, please call and report this information to us.    If any biopsies were taken you will be contacted by phone or by letter within the next 1-3 weeks.  Please call us at [phone] if you have not heard about the biopsies in 3 weeks.    SIGNATURES/CONFIDENTIALITY: You and/or your care partner have signed paperwork which will be entered into your electronic medical record.  These signatures attest to the fact   that that the information above on your After Visit Summary has been reviewed and is understood.  Full responsibility of the confidentiality of this discharge information lies with you and/or your care-partner. 

## 2018-11-20 NOTE — Op Note (Signed)
[facility] Patient Name: Michael Ramos Procedure Date: 11/20/2018 2:43 PM MRN: [id number] Endoscopist: Thornton Park MD, MD Age: 48 Referring MD:  Date of Birth: [birth year] Gender: Male Account #: [id number] Procedure:                Colonoscopy Indications:              Chronic diarrhea Medicines:                See the Anesthesia note for documentation of the                            administered medications Procedure:                Pre-Anesthesia Assessment:                           - Prior to the procedure, a History and Physical                            was performed, and patient medications and                            allergies were reviewed. The patient's tolerance of                            previous anesthesia was also reviewed. The risks                            and benefits of the procedure and the sedation                            options and risks were discussed with the patient.                            All questions were answered, and informed consent                            was obtained. Prior Anticoagulants: The patient has                            taken no previous anticoagulant or antiplatelet                            agents. ASA Grade Assessment: III - A patient with                            severe systemic disease. After reviewing the risks                            and benefits, the patient was deemed in                            satisfactory condition to undergo the procedure.  After obtaining informed consent, the colonoscope                            was passed under direct vision. Throughout the                            procedure, the patient's blood pressure, pulse, and                            oxygen saturations were monitored continuously. The                            Colonoscope was introduced through the anus and                            advanced to the the terminal ileum, with                             identification of the appendiceal orifice and IC                            valve. A second forward view of the right colon was                            performed. The colonoscopy was performed without                            difficulty. The patient tolerated the procedure                            well. The quality of the bowel preparation was                            good. The terminal ileum, ileocecal valve,                            appendiceal orifice, and rectum were photographed. Scope In: 3:10:21 PM Scope Out: 3:23:54 PM Scope Withdrawal Time: 0 hours 12 minutes 30 seconds  Total Procedure Duration: 0 hours 13 minutes 33 seconds  Findings:                 The perianal and digital rectal examinations were                            normal.                           Multiple small and large-mouthed diverticula were                            found in the sigmoid colon and descending colon.                           The colon (entire examined portion) appeared  normal. Biopsies were taken from the right colon                            and left colon with a cold forceps for histology.                           The examined terminal ileum appeared normal. The                            exam was otherwise without abnormality on direct                            and retroflexion views. Complications:            No immediate complications. Estimated blood loss:                            Minimal. Estimated Blood Loss:     Estimated blood loss was minimal. Impression:               - Diverticulosis in the sigmoid colon and in the                            descending colon.                           - The entire examined colon is normal. Biopsied.                           - The examination was otherwise normal on direct                            and retroflexion views. No obvious etiology of                             diarrhea identified on this examination. Awaiting                            biopsy results. Recommendation:           - Patient has a contact number available for                            emergencies. The signs and symptoms of potential                            delayed complications were discussed with the                            patient. Return to normal activities tomorrow.                            Written discharge instructions were provided to the                            patient.                           -  Resume previous diet today.                           - Continue present medications.                           - Await pathology results.                           - Repeat colonoscopy in 10 years for screening                            purposes. Thornton Park MD, MD 11/20/2018 3:38:27 PM This report has been signed electronically.

## 2018-11-20 NOTE — Progress Notes (Signed)
PT taken to PACU. Monitors in place. VSS. Report given to RN. 

## 2018-11-20 NOTE — Op Note (Signed)
[facility] Patient Name: Michael Ramos Procedure Date: 11/20/2018 2:44 PM MRN: [id number] Endoscopist: Thornton Park MD, MD Age: 48 Referring MD:  Date of Birth: Sep 08, 1970 Gender: Male Account #: [id number] Procedure:                Upper GI endoscopy Indications:              Diarrhea x 4-6 months                           - GI pathogen panel including negative C Diff and                            Giardia                           - normal TSH                           -Not improving despite discontinuation of metformin                           Hematemesis x 1 during episode of acute abdominal                            pain without anemia Medicines:                See the Anesthesia note for documentation of the                            administered medications Procedure:                Pre-Anesthesia Assessment:                           - Prior to the procedure, a History and Physical                            was performed, and patient medications and                            allergies were reviewed. The patient's tolerance of                            previous anesthesia was also reviewed. The risks                            and benefits of the procedure and the sedation                            options and risks were discussed with the patient.                            All questions were answered, and informed consent                            was obtained. Prior Anticoagulants:  The patient has                            taken no previous anticoagulant or antiplatelet                            agents. ASA Grade Assessment: III - A patient with                            severe systemic disease. After reviewing the risks                            and benefits, the patient was deemed in                            satisfactory condition to undergo the procedure.                           After obtaining informed consent, the endoscope was                             passed under direct vision. Throughout the                            procedure, the patient's blood pressure, pulse, and                            oxygen saturations were monitored continuously. The                            Endoscope was introduced through the mouth, and                            advanced to the second part of duodenum. The upper                            GI endoscopy was accomplished without difficulty.                            The patient tolerated the procedure well. Scope In: Scope Out: Findings:                 The esophagus was normal.                           Diffuse mildly erythematous mucosa was found in the                            gastric body. Biopsies were taken with a cold                            forceps for histology. Estimated blood loss was                            minimal.  Patchy mildly erythematous mucosa was found in the                            duodenal bulb. Biopsies were taken with a cold                            forceps for histology. Estimated blood loss was                            minimal.                           The cardia and gastric fundus were normal on                            retroflexion.                           The exam was otherwise without abnormality. Complications:            No immediate complications. Estimated blood loss:                            Minimal. Estimated Blood Loss:     Estimated blood loss was minimal. Impression:               - Normal esophagus.                           - Mild erythematous mucosa in the gastric body.                            Biopsied.                           - Mild erythematous duodenopathy. Biopsied.                           - The examination was otherwise normal.                           - No obvious etiology of diarrhea identified on                            this examination. Awaiting biopsy  results. Recommendation:           - Patient has a contact number available for                            emergencies. The signs and symptoms of potential                            delayed complications were discussed with the                            patient. Return to normal activities tomorrow.  Written discharge instructions were provided to the                            patient.                           - Resume previous diet today.                           - Continue present medications.                           - Await pathology results.                           - Proceed with colonoscopy today as previously                            planned. Thornton Park MD, MD 11/20/2018 3:34:24 PM This report has been signed electronically.

## 2018-11-21 ENCOUNTER — Other Ambulatory Visit: Payer: Medicare Other

## 2018-11-21 DIAGNOSIS — K529 Noninfective gastroenteritis and colitis, unspecified: Secondary | ICD-10-CM | POA: Diagnosis not present

## 2018-11-22 ENCOUNTER — Telehealth: Payer: Self-pay | Admitting: *Deleted

## 2018-11-22 NOTE — Telephone Encounter (Signed)
  Follow up Call-  Call back number 11/20/2018  Post procedure Call Back phone  # [phone]  Permission to leave phone message Yes  Some recent data might be hidden     Patient questions:  Do you have a fever, pain , or abdominal swelling? No. Pain Score  0 *  Have you tolerated food without any problems? Yes.    Have you been able to return to your normal activities? Yes.    Do you have any questions about your discharge instructions: Diet   No. Medications  No. Follow up visit  No.  Do you have questions or concerns about your Care? No.  Actions: * If pain score is 4 or above: No action needed, pain <4.  1. Have you developed a fever since your procedure? no  2.   Have you had an respiratory symptoms (SOB or cough) since your procedure? no  3.   Have you tested positive for COVID 19 since your procedure no  4.   Have you had any family members/close contacts diagnosed with the COVID 19 since your procedure?  no   If yes to any of these questions please route to Joylene John, RN and Alphonsa Gin, Therapist, sports.

## 2018-11-26 LAB — CALPROTECTIN, FECAL: Calprotectin, Fecal: 50 ug/g (ref 0–120)

## 2018-11-27 ENCOUNTER — Other Ambulatory Visit: Payer: Self-pay | Admitting: *Deleted

## 2018-11-27 DIAGNOSIS — K529 Noninfective gastroenteritis and colitis, unspecified: Secondary | ICD-10-CM

## 2018-11-27 MED ORDER — RIFAXIMIN 550 MG PO TABS: 550.0000 mg | ORAL_TABLET | Freq: Three times a day (TID) | ORAL | 0 refills | Status: AC

## 2018-11-27 MED ORDER — RIFAXIMIN 550 MG PO TABS: 550.0000 mg | ORAL_TABLET | Freq: Three times a day (TID) | ORAL | 0 refills | Status: DC

## 2018-11-28 ENCOUNTER — Telehealth: Payer: Self-pay | Admitting: Cardiology

## 2018-11-28 ENCOUNTER — Telehealth: Payer: Self-pay | Admitting: Physician Assistant

## 2018-11-28 ENCOUNTER — Other Ambulatory Visit: Payer: Medicare Other

## 2018-11-28 NOTE — Telephone Encounter (Signed)
Will forward to Dr. Branch to advise.  

## 2018-11-28 NOTE — Telephone Encounter (Signed)
Dr.Branch reduced dosage of Furosemide and mother is asking if patient needs to cut dose off Potassium. / tg

## 2018-11-28 NOTE — Telephone Encounter (Signed)
I would continue the same potssium. Is he still having issues with diarrhea, you lose a lot of potassium with that as well and so even if on lower lasix would continue the same potassium  Zandra Abts MD

## 2018-11-28 NOTE — Telephone Encounter (Signed)
Patient aware that Cardiologist prescribes lasix and potassium and will need to contact cardiologist discus dosage.

## 2018-11-28 NOTE — Telephone Encounter (Signed)
Called to inform of Dr. Nelly Laurence recommendations.

## 2018-11-29 ENCOUNTER — Other Ambulatory Visit: Payer: Medicare Other

## 2018-11-29 DIAGNOSIS — K529 Noninfective gastroenteritis and colitis, unspecified: Secondary | ICD-10-CM | POA: Diagnosis not present

## 2018-12-02 ENCOUNTER — Telehealth: Payer: Self-pay | Admitting: *Deleted

## 2018-12-02 NOTE — Telephone Encounter (Signed)
Called [facility], still awaiting PA. Will continue to f/u.

## 2018-12-05 LAB — PANCREATIC ELASTASE, FECAL: Pancreatic Elastase-1, Stool: >500 mcg/g

## 2018-12-05 NOTE — Telephone Encounter (Signed)
Received fax from [facility] stating the patients Xifaxan was approved and they would reach out to the patient for delivery.   Called the patient sister, reports that the Xifaxan was approved and [facility] would be reaching out to them for delivery. Gave the sister the number to follow up if no call was received today.

## 2018-12-14 ENCOUNTER — Other Ambulatory Visit: Payer: Self-pay | Admitting: Physician Assistant

## 2018-12-17 ENCOUNTER — Encounter: Payer: Self-pay | Admitting: Family

## 2018-12-17 ENCOUNTER — Telehealth: Payer: Self-pay | Admitting: Physician Assistant

## 2018-12-17 ENCOUNTER — Other Ambulatory Visit: Payer: Self-pay

## 2018-12-17 ENCOUNTER — Ambulatory Visit (INDEPENDENT_AMBULATORY_CARE_PROVIDER_SITE_OTHER): Payer: Medicare Other | Admitting: Family

## 2018-12-17 DIAGNOSIS — H109 Unspecified conjunctivitis: Secondary | ICD-10-CM

## 2018-12-17 MED ORDER — POLYMYXIN B-TRIMETHOPRIM 10000-0.1 UNIT/ML-% OP SOLN: 1.0000 [drp] | Freq: Four times a day (QID) | OPHTHALMIC | 0 refills | Status: DC

## 2018-12-17 NOTE — Progress Notes (Signed)
   Virtual Visit via telephone Note Due to COVID-19 pandemic this visit was conducted virtually. This visit type was conducted due to national recommendations for restrictions regarding the COVID-19 Pandemic (e.g. social distancing, sheltering in place) in an effort to limit this patient's exposure and mitigate transmission in our community. All issues noted in this document were discussed and addressed.  A physical exam was not performed with this format.  I connected with Michael Ramos and his mother on 12/17/18 at 12:15 pm  by telephone and verified that I am speaking with the correct person using two identifiers. Michael Ramos is currently located at family's home and sisters is currently with him  during visit. The provider, Evelina Dun, FNP is located in their office at time of visit.  I discussed the limitations, risks, security and privacy concerns of performing an evaluation and management service by telephone and the availability of in person appointments. I also discussed with the patient that there may be a patient responsible charge related to this service. The patient expressed understanding and agreed to proceed.   History and Present Illness:  Pt has autisms and mother and sisters care for patient.  Conjunctivitis  The current episode started 5 to 7 days ago. The onset was gradual. The problem occurs continuously. The problem has been unchanged. The problem is mild. Nothing relieves the symptoms. Associated symptoms include eye itching, eye discharge and eye redness. Pertinent negatives include no double vision, no ear pain, no rhinorrhea and no sore throat. The right eye is affected.      Review of Systems  HENT: Negative for ear pain, rhinorrhea and sore throat.   Eyes: Positive for discharge, redness and itching. Negative for double vision.  All other systems reviewed and are negative.    Observations/Objective: Mother did most of the  Talking,  Assessment and  Plan: 1. Bacterial conjunctivitis of right eye Do not rub or scratch Warm compresses Good hand hygiene discussed RTO if symptoms worsen or do not improve  - trimethoprim-polymyxin b (POLYTRIM) ophthalmic solution; Place 1 drop into the right eye every 6 (six) hours.  Dispense: 10 mL; Refill: 0     I discussed the assessment and treatment plan with the patient. The patient was provided an opportunity to ask questions and all were answered. The patient agreed with the plan and demonstrated an understanding of the instructions.   The patient was advised to call back or seek an in-person evaluation if the symptoms worsen or if the condition fails to improve as anticipated.  The above assessment and management plan was discussed with the patient. The patient verbalized understanding of and has agreed to the management plan. Patient is aware to call the clinic if symptoms persist or worsen. Patient is aware when to return to the clinic for a follow-up visit. Patient educated on when it is appropriate to go to the emergency department.   Time call ended:  12:22 pm  I provided 7 minutes of non-face-to-face time during this encounter.    Evelina Dun, FNP

## 2019-01-10 ENCOUNTER — Encounter: Payer: Self-pay | Admitting: Gastroenterology

## 2019-01-10 ENCOUNTER — Other Ambulatory Visit (INDEPENDENT_AMBULATORY_CARE_PROVIDER_SITE_OTHER): Payer: Medicare Other

## 2019-01-10 ENCOUNTER — Ambulatory Visit (INDEPENDENT_AMBULATORY_CARE_PROVIDER_SITE_OTHER): Payer: Medicare Other | Admitting: Gastroenterology

## 2019-01-10 VITALS — BP 122/74 | HR 88 | Temp 97.0°F | Ht 75.0 in | Wt 296.0 lb

## 2019-01-10 DIAGNOSIS — R197 Diarrhea, unspecified: Secondary | ICD-10-CM

## 2019-01-10 DIAGNOSIS — K920 Hematemesis: Secondary | ICD-10-CM | POA: Diagnosis not present

## 2019-01-10 LAB — COMPREHENSIVE METABOLIC PANEL
ALT: 21 U/L (ref 0–53)
AST: 15 U/L (ref 0–37)
Albumin: 4 g/dL (ref 3.5–5.2)
Alkaline Phosphatase: 39 U/L (ref 39–117)
BUN: 18 mg/dL (ref 6–23)
CO2: 26 mEq/L (ref 19–32)
Calcium: 9.1 mg/dL (ref 8.4–10.5)
Chloride: 99 mEq/L (ref 96–112)
Creatinine, Ser: 1.48 mg/dL (ref 0.40–1.50)
GFR: 50.95 mL/min — ABNORMAL LOW (ref >60.00)
Glucose, Bld: 297 mg/dL — ABNORMAL HIGH (ref 70–99)
Potassium: 4.6 mEq/L (ref 3.5–5.1)
Sodium: 134 mEq/L — ABNORMAL LOW (ref 135–145)
Total Bilirubin: 0.5 mg/dL (ref 0.2–1.2)
Total Protein: 7.3 g/dL (ref 6.0–8.3)

## 2019-01-10 MED ORDER — COLESEVELAM HCL 3.75 G PO PACK: 1.8750 g | PACK | Freq: Two times a day (BID) | ORAL | 1 refills | Status: DC

## 2019-01-10 NOTE — Patient Instructions (Addendum)
Your provider has requested that you go to the basement level for lab work before leaving today. Press "B" on the elevator. The lab is located at the first door on the left as you exit the elevator.  We have sent the following medications to your pharmacy for you to pick up at your convenience: Colesevelam 1.875 mg twice a day   Continue Benefiber every day.

## 2019-01-10 NOTE — Progress Notes (Signed)
Referring Provider: Terald Sleeper, PA-C Primary Care Physician:  Terald Sleeper, PA-C  Reason for Consultation: Bowel movements, abdominal pain   IMPRESSION:  Diarrhea x 4-6 months    - GI pathogen panel including negative C Diff and Giardia    - normal TSH    - Not improving despite discontinuation of metformin    - Fecal calprotectin 50, fecal elastase >500    - EGD/Colon 11/20/18 endoscopically normal, duodenal and colon biopsies were normal    - no change with empiric trial of Xifaxan 550 mg TID x 14 days ESR 48, CRP 1.5 Recent hyperkalemia Creatinine 1.61 Cholelithiasis by ultrasound Echogenic liver by ultrasound  No source for his diarrhea was identified on EGD or colonoscopy or on stool studies.  No change with Metamucil or an empiric trial of rifaximin 550 mg 3 times daily for 14 days. 50 percent of patients with functional diarrhea and IBS-D have bile acid malabsorption. Trial of colesevelam recommended.    CMP today to monitor for electrolyte abnormalities and renal insufficiency due to ongoing diarrhea.   Consider additional testing with alarm symptoms with fecal elastase, fasting serum gastrin, calcitonin, somatostatin, VIP, and 24 hours urine 5-HIAA.   PLAN:  CMP Continue Benefiber every day Colesevelam 1.875 g twice daily Trial of cholestyramine 4g BID if insurance does not improve colesevelam Screening colonoscopy in 10 years Return in 3 months or earlier as needed   Please see the "Patient Instructions" section for addition details about the plan.  HPI: Michael Ramos is a 48 y.o. male under evaluation for a recent change in bowel habits that have not improved despite discontinuation of metformin. The interval history is obtained through the patient and review of his electronic health record. His sister accompanies him to this appointment. He has Asperger' Syndrome, hypertension, heart failure, allergic rhinitis, type 2 diabetes. Echo 08/07/17 showed LVEF of  35-40%.   He had a colonoscopy and EGD 11/20/18. Colonoscopy was normal except for left sided diverticulosis. EGD showed gastris and duodenitis. Gastric biopsies showed gastropathy. Duodenal biopsies and random colon biopsies were negative.   Metamucil and a trial of Xifaxan 550 mg TID x 14 days provided no change in his symptoms.  Having 0-4 BM daily, now for at leats 6 months. Associated urgency. No abdominal pain. No blood or mucous.  No change in symptoms with eating, defecation, or movement.  Weight remains stable.   His sister thinks that it could be IBS. She notes greasy foods cause some worsening.   Labs 09/12/18: negative GI pathogen panel, including C diff and Giardia Labs 11/15/2018: normal TSH of 2.23.  CBC was normal.  BUN 17, creatinine 1.61, potassium 5.6, glucose 194. Labs 11/19/18: CRP 1.5 Labs 11/29/18: Fecal calprotectin 50, fecal elastase >500  Abdominal ultrasound 09/10/2018 showed: gallbladder full of debris and stones with mild gallbladder wall thickening at 3 mm. Increased hepatic echogenicity consistent fatty infiltration. Bilateral renal cortical thinning.   Past Medical History:  Diagnosis Date  . Allergy   . Autism   . CHF (congestive heart failure) ([facility])    a. EF 20-25% by echo in 08/2016 b. repeat echo in 02/2017 showing EF 30-35% with cath showing normal cors.   . Diabetes mellitus without complication ([facility])   . Obesity (BMI 30-39.9) 10/23/2017    Past Surgical History:  Procedure Laterality Date  . RIGHT/LEFT HEART CATH AND CORONARY ANGIOGRAPHY N/A 03/06/2017   Procedure: RIGHT/LEFT HEART CATH AND CORONARY ANGIOGRAPHY;  Surgeon: Burnell Blanks,  MD;  Location: [facility];  Service: Cardiovascular;  Laterality: N/A;    Prior to Admission medications   Medication Sig Start Date End Date Taking? Authorizing Provider  carvedilol (COREG) 25 MG tablet TAKE ONE TABLET BY MOUTH TWICE DAILY 08/21/18   Arnoldo Lenis, MD  ENTRESTO 49-51 MG TAKE ONE  TABLET BY MOUTH TWICE DAILY 08/22/18   Arnoldo Lenis, MD  furosemide (LASIX) 20 MG tablet Take 1 tablet (20 mg total) by mouth daily. 11/07/18 02/05/19  Arnoldo Lenis, MD  glipiZIDE (GLUCOTROL) 5 MG tablet TAKE 1 TABLET TWICE A DAY BEFORE A MEAL 11/12/18   Terald Sleeper, PA-C  loratadine (CLARITIN) 10 MG tablet TAKE ONE (1) TABLET EACH DAY 11/12/18   Terald Sleeper, PA-C  metFORMIN (GLUCOPHAGE) 500 MG tablet Take 0.5 tablets (250 mg total) by mouth 2 (two) times daily with a meal. 11/06/18   Terald Sleeper, PA-C  pantoprazole (PROTONIX) 40 MG tablet Take 1 tablet (40 mg total) by mouth daily. 09/17/18   Terald Sleeper, PA-C  potassium chloride SA (K-DUR,KLOR-CON) 20 MEQ tablet TAKE TWO TABLETS BY MOUTH DAILY 06/06/18   Arnoldo Lenis, MD    Current Outpatient Medications  Medication Sig Dispense Refill  . carvedilol (COREG) 25 MG tablet TAKE ONE TABLET BY MOUTH TWICE DAILY 180 tablet 3  . ENTRESTO 49-51 MG TAKE ONE TABLET BY MOUTH TWICE DAILY 60 tablet 6  . furosemide (LASIX) 20 MG tablet Take 1 tablet (20 mg total) by mouth daily. 90 tablet 3  . glipiZIDE (GLUCOTROL) 5 MG tablet TAKE 1 TABLET TWICE A DAY BEFORE A MEAL 180 tablet 3  . loratadine (CLARITIN) 10 MG tablet TAKE ONE (1) TABLET EACH DAY 90 tablet 3  . metFORMIN (GLUCOPHAGE) 500 MG tablet TAKE 1/2 TABLET TWICE DAILY WITH MEALS 30 tablet 1  . pantoprazole (PROTONIX) 40 MG tablet Take 1 tablet (40 mg total) by mouth daily. 30 tablet 5  . potassium chloride SA (K-DUR,KLOR-CON) 20 MEQ tablet TAKE TWO TABLETS BY MOUTH DAILY 180 tablet 2  . trimethoprim-polymyxin b (POLYTRIM) ophthalmic solution Place 1 drop into the right eye every 6 (six) hours. 10 mL 0   No current facility-administered medications for this visit.     Allergies as of 01/10/2019  . (No Known Allergies)    Family History  Problem Relation Age of Onset  . Diabetes Mother   . Cancer Father   . Diabetes Father     Social History   Socioeconomic History  .  Marital status: Single    Spouse name: Not on file  . Number of children: Not on file  . Years of education: Not on file  . Highest education level: 12th grade  Occupational History  . Occupation: Disabled  Social Needs  . Financial resource strain: Not hard at all  . Food insecurity    Worry: Never true    Inability: Never true  . Transportation needs    Medical: No    Non-medical: No  Tobacco Use  . Smoking status: Never Smoker  . Smokeless tobacco: Never Used  Substance and Sexual Activity  . Alcohol use: No  . Drug use: No  . Sexual activity: Not Currently  Lifestyle  . Physical activity    Days per week: 0 days    Minutes per session: 0 min  . Stress: Not at all  Relationships  . Social connections    Talks on phone: More than three times a  week    Gets together: More than three times a week    Attends religious service: Never    Active member of club or organization: No    Attends meetings of clubs or organizations: Never    Relationship status: Never married  . Intimate partner violence    Fear of current or ex partner: No    Emotionally abused: No    Physically abused: No    Forced sexual activity: No  Other Topics Concern  . Not on file  Social History Narrative   Autistic   Lives with Mother    Physical Exam: General:   Alert,  well-nourished, pleasant and cooperative in NAD Head:  Normocephalic and atraumatic. Eyes:  Sclera clear, no icterus.   Conjunctiva pink. Ears:  Normal auditory acuity. Nose:  No deformity, discharge,  or lesions. Mouth:  No deformity or lesions.   Neck:  Supple; no masses or thyromegaly. Lungs:  Clear throughout to auscultation.   No wheezes. Heart:  Regular rate and rhythm; no murmurs. Abdomen:  Firn, ventral hernia,nontender, nondistended, normal bowel sounds, no rebound or guarding. No hepatosplenomegaly.   Rectal:  Deferred  Msk:  Symmetrical. No boney deformities LAD: No inguinal or umbilical LAD Extremities:  No  clubbing or edema. Neurologic:  Alert and  oriented x4;  grossly nonfocal Skin:  Intact without significant lesions or rashes. Psych:  Alert and cooperative. Normal mood and affect.    Kimberly L. Tarri Glenn, MD, MPH 01/10/2019, 11:00 AM

## 2019-01-11 ENCOUNTER — Encounter: Payer: Self-pay | Admitting: Gastroenterology

## 2019-01-15 DIAGNOSIS — Z23 Encounter for immunization: Secondary | ICD-10-CM | POA: Diagnosis not present

## 2019-02-11 ENCOUNTER — Other Ambulatory Visit: Payer: Self-pay | Admitting: Physician Assistant

## 2019-02-13 ENCOUNTER — Telehealth: Payer: Self-pay | Admitting: Gastroenterology

## 2019-02-13 NOTE — Telephone Encounter (Signed)
Notified patient's sister of Dr. Tarri Glenn recommendations.

## 2019-02-13 NOTE — Telephone Encounter (Signed)
Spoke to the patients sister who stated the patient suddenly developed a few episode of vomiting and diarrhea over about a 24 hour period. Its now been about 8 hours since his last episode of diarrhea and vomiting. Reports that he is now back at baseline. The sister states though the patient has Autism, he will say if he does not feel good. The patient is currently reporting he feels fine. Afebrile, no sick contacts, unsure of if he ate something which caused GI upset. Reports his stool smelled "sour...like something soured his stomach."

## 2019-02-13 NOTE — Telephone Encounter (Signed)
I am sorry that he had such a rough night. But, I am glad that he's feeling better. Hopefully this is an acute viral illness.   He should drink a lot of liquids that have water, salt, and sugar. Good choices are water mixed with juice, flavored soda, and soup broth. Try to eat a little food. Good choices are potatoes, noodles, rice, oatmeal, crackers, bananas, soup, and boiled vegetables. Avoid high fat foods and dairy, as they can make diarrhea and nausea worse.   I hope that he will continue to feel better. Please call with any additional questions or concerns or if his symptoms worsen. Thank you.

## 2019-02-18 ENCOUNTER — Other Ambulatory Visit: Payer: Self-pay | Admitting: Cardiology

## 2019-03-07 ENCOUNTER — Other Ambulatory Visit: Payer: Self-pay | Admitting: Physician Assistant

## 2019-03-10 ENCOUNTER — Other Ambulatory Visit: Payer: Self-pay | Admitting: Physician Assistant

## 2019-03-10 ENCOUNTER — Telehealth: Payer: Self-pay | Admitting: Physician Assistant

## 2019-03-10 NOTE — Telephone Encounter (Signed)
BP 140/105  CBG 203 Call [phone]

## 2019-03-10 NOTE — Telephone Encounter (Signed)
Sister aware, if still lightheaded tomorrow, give [place] a call

## 2019-03-10 NOTE — Telephone Encounter (Signed)
Based on 1 day of blood pressure readings I would not make a change.  Please monitor this over the next day or so.  Reduce any sodium in the diet.  Also above blood sugar is not extremely high.  He does need to come in and have labs and a visit.  And his blood sugar close proximity to a meal?  Have you been fasting for

## 2019-03-18 ENCOUNTER — Ambulatory Visit (INDEPENDENT_AMBULATORY_CARE_PROVIDER_SITE_OTHER): Payer: Medicare Other | Admitting: Cardiology

## 2019-03-18 ENCOUNTER — Other Ambulatory Visit: Payer: Self-pay

## 2019-03-18 ENCOUNTER — Encounter: Payer: Self-pay | Admitting: Cardiology

## 2019-03-18 VITALS — BP 109/75 | HR 78 | Temp 96.6°F | Ht 75.0 in | Wt 293.0 lb

## 2019-03-18 DIAGNOSIS — I428 Other cardiomyopathies: Secondary | ICD-10-CM

## 2019-03-18 DIAGNOSIS — I5022 Chronic systolic (congestive) heart failure: Secondary | ICD-10-CM | POA: Diagnosis not present

## 2019-03-18 NOTE — Progress Notes (Signed)
Clinical Summary Michael Ramos is a 48 y.o.male seen today for follow up of the following medical problems.    1. Chronic systolic HF/NICM - new diagnosis during 08/2016 admission with fluid overload - 02/2017 cath no significant CAD.  07/2017 echo LVEF 35-40%, grade II diastolic dysfunction    - compliant with meds - denies any SOB/doe - no LE edema    2. Nocturnal hypoxia/Possible sleep apnea - followed by Dr Radford Pax - drop in O2 sats noted during home oximetry. There were discussions about a formal sleep study however family does not feel patient could cooperate with study due to his auticsion   3. Autism    SH: Has 7 brothers and sisters.   Past Medical History:  Diagnosis Date  . Allergy   . Autism   . CHF (congestive heart failure) ([place])    a. EF 20-25% by echo in 08/2016 b. repeat echo in 02/2017 showing EF 30-35% with cath showing normal cors.   . Diabetes mellitus without complication ([place])   . Obesity (BMI 30-39.9) 10/23/2017     No Known Allergies   Current Outpatient Medications  Medication Sig Dispense Refill  . carvedilol (COREG) 25 MG tablet TAKE ONE TABLET BY MOUTH TWICE DAILY 180 tablet 3  . Colesevelam HCl (WELCHOL) 3.75 g PACK Take 0.5 packets (1.875 g total) by mouth 2 (two) times daily before a meal. 30 each 1  . ENTRESTO 49-51 MG TAKE ONE TABLET BY MOUTH TWICE DAILY 60 tablet 6  . furosemide (LASIX) 20 MG tablet Take 1 tablet (20 mg total) by mouth daily. 90 tablet 3  . glipiZIDE (GLUCOTROL) 5 MG tablet TAKE 1 TABLET TWICE A DAY BEFORE A MEAL 180 tablet 3  . loratadine (CLARITIN) 10 MG tablet TAKE ONE (1) TABLET EACH DAY 90 tablet 3  . metFORMIN (GLUCOPHAGE) 500 MG tablet TAKE 1/2 TABLET TWICE DAILY WITH MEALS 30 tablet 0  . pantoprazole (PROTONIX) 40 MG tablet TAKE ONE (1) TABLET EACH DAY 30 tablet 0  . potassium chloride SA (KLOR-CON) 20 MEQ tablet TAKE TWO TABLETS BY MOUTH DAILY 180 tablet 2  . trimethoprim-polymyxin b  (POLYTRIM) ophthalmic solution Place 1 drop into the right eye every 6 (six) hours. 10 mL 0   No current facility-administered medications for this visit.      Past Surgical History:  Procedure Laterality Date  . RIGHT/LEFT HEART CATH AND CORONARY ANGIOGRAPHY N/A 03/06/2017   Procedure: RIGHT/LEFT HEART CATH AND CORONARY ANGIOGRAPHY;  Surgeon: Burnell Blanks, MD;  Location: [facility];  Service: Cardiovascular;  Laterality: N/A;     No Known Allergies    Family History  Problem Relation Age of Onset  . Diabetes Mother   . Diabetes Father   . Bladder Cancer Father   . Colon cancer Neg Hx   . Esophageal cancer Neg Hx   . Liver cancer Neg Hx   . Stomach cancer Neg Hx      Social History Mr. Trenary reports that he has never smoked. He has never used smokeless tobacco. Mr. Souva reports no history of alcohol use.   Review of Systems CONSTITUTIONAL: No weight loss, fever, chills, weakness or fatigue.  HEENT: Eyes: No visual loss, blurred vision, double vision or yellow sclerae.No hearing loss, sneezing, congestion, runny nose or sore throat.  SKIN: No rash or itching.  CARDIOVASCULAR: per hpi RESPIRATORY: No shortness of breath, cough or sputum.  GASTROINTESTINAL: No anorexia, nausea, vomiting or diarrhea. No abdominal pain  or blood.  GENITOURINARY: No burning on urination, no polyuria NEUROLOGICAL: No headache, dizziness, syncope, paralysis, ataxia, numbness or tingling in the extremities. No change in bowel or bladder control.  MUSCULOSKELETAL: No muscle, back pain, joint pain or stiffness.  LYMPHATICS: No enlarged nodes. No history of splenectomy.  PSYCHIATRIC: No history of depression or anxiety.  ENDOCRINOLOGIC: No reports of sweating, cold or heat intolerance. No polyuria or polydipsia.  Marland Kitchen   Physical Examination Today's Vitals   03/18/19 1113  BP: 109/75  Pulse: 78  Temp: (!) 96.6 F (35.9 C)  TempSrc: Temporal  SpO2: 96%  Weight: 293 lb  (132.9 kg)  Height: 6\' 3"  (1.905 m)   Body mass index is 36.62 kg/m.  Gen: resting comfortably, no acute distress HEENT: no scleral icterus, pupils equal round and reactive, no palptable cervical adenopathy,  CV: RRR, no m/r/g, no jvd Resp: Clear to auscultation bilaterally GI: abdomen is soft, non-tender, non-distended, normal bowel sounds, no hepatosplenomegaly MSK: extremities are warm, no edema.  Skin: warm, no rash Neuro:  no focal deficits Psych: appropriate affect   Diagnostic Studies  02/2017 cath  Hemodynamic findings consistent with mild pulmonary hypertension.  1. No angiographic evidence of CAD 2. Non-ischemic cardiomyopathy  Recommendations: medical management of cardiomyopathy and CHF.   02/2017 echo Study Conclusions  - Left ventricle: The cavity size was mildly dilated. Wall thickness was normal. Systolic function was moderately to severely reduced. The estimated ejection fraction was in the range of 30% to 35%. Diffuse hypokinesis. Diastolic dysfunction, grade indeterminate. Indeterminate filling pressures. - Mitral valve: There was mild regurgitation.   07/2017 echo Study Conclusions  - Left ventricle: The cavity size was mildly dilated. Wall thickness was normal. Systolic function was moderately reduced. The estimated ejection fraction was in the range of 35% to 40%. Diffuse hypokinesis. Features are consistent with a pseudonormal left ventricular filling pattern, with concomitant abnormal relaxation and increased filling pressure (grade 2 diastolic dysfunction). - Mitral valve: There was mild regurgitation.   Assessment and Plan  1. Chronic systolic HF/ NICM -medical therapy limited by his renal function with mild uptredn in Cr previously, also soft bp's  - continue current meds, no current symptoms.  - likely repeat echo next visit to reassess LV function, may consider attempt at medication titration pending  findings   F/u 6 months      Arnoldo Lenis, M.D.

## 2019-03-18 NOTE — Patient Instructions (Signed)

## 2019-03-21 ENCOUNTER — Other Ambulatory Visit: Payer: Self-pay | Admitting: Cardiology

## 2019-04-12 ENCOUNTER — Other Ambulatory Visit: Payer: Self-pay | Admitting: Physician Assistant

## 2019-04-17 ENCOUNTER — Other Ambulatory Visit: Payer: Self-pay | Admitting: Physician Assistant

## 2019-04-21 ENCOUNTER — Telehealth: Payer: Self-pay | Admitting: Physician Assistant

## 2019-04-21 NOTE — Telephone Encounter (Signed)
Yes that would be okay for him to increase the med.  Does he need a refill?

## 2019-04-21 NOTE — Telephone Encounter (Signed)
Mother states that patient's blood sugar has been elevated in the high 100s the past 4 mornings.  Mother is wanting to know if she should elevate his metformin to 1 in the morning and 1 at night.  Currently taking 0.5 tablet twice daily.

## 2019-04-21 NOTE — Telephone Encounter (Signed)
Patient aware and verbalized understanding. °

## 2019-04-24 ENCOUNTER — Telehealth: Payer: Self-pay | Admitting: Physician Assistant

## 2019-04-24 NOTE — Telephone Encounter (Signed)
Appointment given for 04/29/2019 at 3:55pm.

## 2019-04-28 ENCOUNTER — Other Ambulatory Visit: Payer: Self-pay

## 2019-04-29 ENCOUNTER — Encounter: Payer: Self-pay | Admitting: Physician Assistant

## 2019-04-29 ENCOUNTER — Ambulatory Visit (INDEPENDENT_AMBULATORY_CARE_PROVIDER_SITE_OTHER): Payer: Medicare Other | Admitting: Physician Assistant

## 2019-04-29 VITALS — BP 100/72 | HR 81 | Temp 98.7°F | Ht 75.0 in | Wt 287.0 lb

## 2019-04-29 DIAGNOSIS — E119 Type 2 diabetes mellitus without complications: Secondary | ICD-10-CM

## 2019-04-29 DIAGNOSIS — F840 Autistic disorder: Secondary | ICD-10-CM

## 2019-04-29 DIAGNOSIS — I10 Essential (primary) hypertension: Secondary | ICD-10-CM | POA: Diagnosis not present

## 2019-04-29 DIAGNOSIS — E669 Obesity, unspecified: Secondary | ICD-10-CM

## 2019-04-29 DIAGNOSIS — J3089 Other allergic rhinitis: Secondary | ICD-10-CM

## 2019-04-29 DIAGNOSIS — I5022 Chronic systolic (congestive) heart failure: Secondary | ICD-10-CM | POA: Diagnosis not present

## 2019-04-29 DIAGNOSIS — Z0000 Encounter for general adult medical examination without abnormal findings: Secondary | ICD-10-CM | POA: Diagnosis not present

## 2019-04-29 DIAGNOSIS — H5789 Other specified disorders of eye and adnexa: Secondary | ICD-10-CM | POA: Diagnosis not present

## 2019-04-29 DIAGNOSIS — I420 Dilated cardiomyopathy: Secondary | ICD-10-CM

## 2019-04-29 LAB — BAYER DCA HB A1C WAIVED: HB A1C (BAYER DCA - WAIVED): 8.8 % — ABNORMAL HIGH (ref <7.0)

## 2019-04-29 NOTE — Progress Notes (Signed)
BP 100/72   Pulse 81   Temp 98.7 F (37.1 C) (Temporal)   Ht '6\' 3"'  (1.905 m)   Wt 287 lb (130.2 kg)   SpO2 97%   BMI 35.87 kg/m    Subjective:    Patient ID: Michael Ramos, male    DOB: 26-Dec-1970, 49 y.o.   MRN: [id number]  HPI: Michael Ramos is a 49 y.o. male presenting on 04/29/2019 for Forms for Cap program    Past Medical History:  Diagnosis Date  . Allergy   . Autism   . CHF (congestive heart failure) ([place])    a. EF 20-25% by echo in 08/2016 b. repeat echo in 02/2017 showing EF 30-35% with cath showing normal cors.   . Diabetes mellitus without complication ([place])   . Obesity (BMI 30-39.9) 10/23/2017   Relevant past medical, surgical, family and social history reviewed and updated as indicated. Interim medical history since our last visit reviewed. Allergies and medications reviewed and updated. DATA REVIEWED: CHART IN EPIC  Family History reviewed for pertinent findings.  Review of Systems  Constitutional: Negative.  Negative for appetite change and fatigue.  HENT: Negative.   Eyes: Negative.  Negative for pain and visual disturbance.  Respiratory: Negative.  Negative for cough, chest tightness, shortness of breath and wheezing.   Cardiovascular: Negative.  Negative for chest pain, palpitations and leg swelling.  Gastrointestinal: Negative.  Negative for abdominal pain, diarrhea, nausea and vomiting.  Endocrine: Negative.   Genitourinary: Negative.   Musculoskeletal: Negative.   Skin: Negative.  Negative for color change and rash.  Neurological: Negative.  Negative for weakness, numbness and headaches.  Psychiatric/Behavioral: Negative.     Allergies as of 04/29/2019   No Known Allergies     Medication List       Accurate as of April 29, 2019  4:37 PM. If you have any questions, ask your nurse or doctor.        STOP taking these medications   trimethoprim-polymyxin b ophthalmic solution Commonly known as: Polytrim Stopped by: Terald Sleeper,  PA-C     TAKE these medications   carvedilol 25 MG tablet Commonly known as: COREG TAKE ONE TABLET BY MOUTH TWICE DAILY   Colesevelam HCl 3.75 g Pack Commonly known as: Welchol Take 0.5 packets (1.875 g total) by mouth 2 (two) times daily before a meal.   Entresto 49-51 MG Generic drug: sacubitril-valsartan TAKE ONE TABLET BY MOUTH TWICE DAILY   furosemide 20 MG tablet Commonly known as: LASIX Take 1 tablet (20 mg total) by mouth daily.   glipiZIDE 5 MG tablet Commonly known as: GLUCOTROL TAKE 1 TABLET TWICE A DAY BEFORE A MEAL   loratadine 10 MG tablet Commonly known as: CLARITIN TAKE ONE (1) TABLET EACH DAY   metFORMIN 500 MG tablet Commonly known as: GLUCOPHAGE Take 0.5 tablets (250 mg total) by mouth 2 (two) times daily with a meal. (Needs to be seen before next refill)   pantoprazole 40 MG tablet Commonly known as: PROTONIX TAKE ONE (1) TABLET EACH DAY   potassium chloride SA 20 MEQ tablet Commonly known as: KLOR-CON TAKE TWO TABLETS BY MOUTH DAILY   Voltaren 1 % Gel Generic drug: diclofenac Sodium Voltaren 1 % topical gel          Objective:    BP 100/72   Pulse 81   Temp 98.7 F (37.1 C) (Temporal)   Ht '6\' 3"'  (1.905 m)   Wt 287 lb (130.2 kg)  SpO2 97%   BMI 35.87 kg/m   No Known Allergies  Wt Readings from Last 3 Encounters:  04/29/19 287 lb (130.2 kg)  03/18/19 293 lb (132.9 kg)  01/10/19 296 lb (134.3 kg)    Physical Exam Vitals and nursing note reviewed.  Constitutional:      General: He is not in acute distress.    Appearance: He is well-developed.  HENT:     Head: Normocephalic and atraumatic.  Eyes:     Conjunctiva/sclera: Conjunctivae normal.     Pupils: Pupils are equal, round, and reactive to light.  Cardiovascular:     Rate and Rhythm: Normal rate and regular rhythm.     Heart sounds: Normal heart sounds.  Pulmonary:     Effort: Pulmonary effort is normal. No respiratory distress.     Breath sounds: Normal breath  sounds.  Skin:    General: Skin is warm and dry.  Psychiatric:        Behavior: Behavior normal.     Results for orders placed or performed in visit on 01/10/19  Comp Met (CMET)  Result Value Ref Range   Sodium 134 (L) 135 - 145 mEq/L   Potassium 4.6 3.5 - 5.1 mEq/L   Chloride 99 96 - 112 mEq/L   CO2 26 19 - 32 mEq/L   Glucose, Bld 297 (H) 70 - 99 mg/dL   BUN 18 6 - 23 mg/dL   Creatinine, Ser 1.48 0.40 - 1.50 mg/dL   Total Bilirubin 0.5 0.2 - 1.2 mg/dL   Alkaline Phosphatase 39 39 - 117 U/L   AST 15 0 - 37 U/L   ALT 21 0 - 53 U/L   Total Protein 7.3 6.0 - 8.3 g/dL   Albumin 4.0 3.5 - 5.2 g/dL   Calcium 9.1 8.4 - 10.5 mg/dL   GFR 50.95 (L) >60.00 mL/min      Assessment & Plan:   1. Chronic systolic heart failure ([place]) - Ambulatory referral to Ophthalmology  2. Dilated cardiomyopathy ([facility]) - CBC with Differential/Platelet - CMP14+EGFR - Lipid panel  3. Essential hypertension - CBC with Differential/Platelet - CMP14+EGFR - Lipid panel  4. Chronic nonseasonal allergic rhinitis due to pollen  5. Type 2 diabetes mellitus without complication, without long-term current use of insulin ([facility]) - TSH - Bayer DCA Hb A1c Waived - Ambulatory referral to Ophthalmology  6. Autism CAPS referral  7. Obesity (BMI 30-39.9) - CBC with Differential/Platelet - CMP14+EGFR - Lipid panel - TSH - Bayer DCA Hb A1c Waived  8. Well adult exam - CBC with Differential/Platelet - CMP14+EGFR - Lipid panel - TSH - Bayer DCA Hb A1c Waived  9. Redness of both eyes - Ambulatory referral to Ophthalmology    Continue all other maintenance medications as listed above.  Follow up plan: Return in about 3 months (around 07/28/2019) for recheck medications.    Terald Sleeper PA-C [facility] [address]  [place],  [address] [phone]   04/29/2019, 4:37 PM

## 2019-04-29 NOTE — Addendum Note (Signed)
Addended by: Terald Sleeper on: 04/29/2019 04:48 PM   Modules accepted: Orders

## 2019-04-29 NOTE — Progress Notes (Signed)
Acute Office Visit  Subjective:    Patient ID: Michael Ramos, male    DOB: [birth year], 49 y.o.   MRN: [id number]  Chief Complaint  Patient presents with  . Forms for Cap program    Congestive Heart Failure Presents for follow-up visit. Pertinent negatives include no abdominal pain, chest pain, fatigue, muscle weakness, palpitations, paroxysmal nocturnal dyspnea or shortness of breath. The symptoms have been stable. Compliance with total regimen is 76-100%. Compliance problems include adherence to diet.  Compliance with diet is 51-75%. Compliance with exercise is 51-75%. Compliance with medications is 76-100%.  Hyperlipidemia This is a chronic problem. The current episode started more than 1 year ago. The problem is controlled. Recent lipid tests were reviewed and are normal. Exacerbating diseases include diabetes and obesity. Pertinent negatives include no chest pain or shortness of breath. Risk factors for coronary artery disease include male sex, hypertension, dyslipidemia and diabetes mellitus.  Hypertension This is a chronic problem. The problem is controlled. Pertinent negatives include no anxiety, blurred vision, chest pain, headaches, palpitations or shortness of breath. There are no associated agents to hypertension. Risk factors for coronary artery disease include diabetes mellitus, dyslipidemia, family history and male gender. Past treatments include beta blockers, diuretics and angiotensin blockers. The current treatment provides significant improvement. There are no compliance problems.   Eye Problem  Both eyes are affected.This is a chronic problem. The problem occurs intermittently. The problem has been waxing and waning. There was no injury mechanism. The pain is mild. There is no known exposure to pink eye. He does not wear contacts. Pertinent negatives include no blurred vision, nausea, vomiting or weakness.  Diabetes He presents for his follow-up diabetic visit. He has type  2 diabetes mellitus. His disease course has been stable. Pertinent negatives for hypoglycemia include no headaches. Pertinent negatives for diabetes include no blurred vision, no chest pain, no fatigue and no weakness. Symptoms are stable. Risk factors for coronary artery disease include dyslipidemia, family history, male sex, obesity, hypertension and diabetes mellitus. He is following a low fat/cholesterol and generally healthy diet. He participates in exercise intermittently. An ACE inhibitor/angiotensin II receptor blocker is being taken. He does not see a podiatrist.Eye exam is not current.     Past Medical History:  Diagnosis Date  . Allergy   . Autism   . CHF (congestive heart failure) ([place])    a. EF 20-25% by echo in 08/2016 b. repeat echo in 02/2017 showing EF 30-35% with cath showing normal cors.   . Diabetes mellitus without complication ([place])   . Obesity (BMI 30-39.9) 10/23/2017    Past Surgical History:  Procedure Laterality Date  . RIGHT/LEFT HEART CATH AND CORONARY ANGIOGRAPHY N/A 03/06/2017   Procedure: RIGHT/LEFT HEART CATH AND CORONARY ANGIOGRAPHY;  Surgeon: Burnell Blanks, MD;  Location: [facility];  Service: Cardiovascular;  Laterality: N/A;    Family History  Problem Relation Age of Onset  . Diabetes Mother   . Diabetes Father   . Bladder Cancer Father   . Colon cancer Neg Hx   . Esophageal cancer Neg Hx   . Liver cancer Neg Hx   . Stomach cancer Neg Hx     Social History   Socioeconomic History  . Marital status: Single    Spouse name: Not on file  . Number of children: Not on file  . Years of education: Not on file  . Highest education level: 12th grade  Occupational History  . Occupation: Disabled  Tobacco Use  . Smoking status: Never Smoker  . Smokeless tobacco: Never Used  Substance and Sexual Activity  . Alcohol use: No  . Drug use: No  . Sexual activity: Not Currently  Other Topics Concern  . Not on file  Social History  Narrative   Autistic   Lives with Mother   Social Determinants of Health   Financial Resource Strain: Low Risk   . Difficulty of Paying Living Expenses: Not hard at all  Food Insecurity: No Food Insecurity  . Worried About Charity fundraiser in the Last Year: Never true  . Ran Out of Food in the Last Year: Never true  Transportation Needs: No Transportation Needs  . Lack of Transportation (Medical): No  . Lack of Transportation (Non-Medical): No  Physical Activity: Inactive  . Days of Exercise per Week: 0 days  . Minutes of Exercise per Session: 0 min  Stress: No Stress Concern Present  . Feeling of Stress : Not at all  Social Connections: Moderately Isolated  . Frequency of Communication with Friends and Family: More than three times a week  . Frequency of Social Gatherings with Friends and Family: More than three times a week  . Attends Religious Services: Never  . Active Member of Clubs or Organizations: No  . Attends Archivist Meetings: Never  . Marital Status: Never married  Intimate Partner Violence: Not At Risk  . Fear of Current or Ex-Partner: No  . Emotionally Abused: No  . Physically Abused: No  . Sexually Abused: No    Outpatient Medications Prior to Visit  Medication Sig Dispense Refill  . carvedilol (COREG) 25 MG tablet TAKE ONE TABLET BY MOUTH TWICE DAILY 180 tablet 3  . Colesevelam HCl (WELCHOL) 3.75 g PACK Take 0.5 packets (1.875 g total) by mouth 2 (two) times daily before a meal. 30 each 1  . diclofenac Sodium (VOLTAREN) 1 % GEL Voltaren 1 % topical gel    . ENTRESTO 49-51 MG TAKE ONE TABLET BY MOUTH TWICE DAILY 60 tablet 6  . furosemide (LASIX) 20 MG tablet Take 1 tablet (20 mg total) by mouth daily. 90 tablet 3  . glipiZIDE (GLUCOTROL) 5 MG tablet TAKE 1 TABLET TWICE A DAY BEFORE A MEAL 180 tablet 3  . loratadine (CLARITIN) 10 MG tablet TAKE ONE (1) TABLET EACH DAY 90 tablet 3  . metFORMIN (GLUCOPHAGE) 500 MG tablet Take 0.5 tablets (250 mg  total) by mouth 2 (two) times daily with a meal. (Needs to be seen before next refill) 30 tablet 0  . pantoprazole (PROTONIX) 40 MG tablet TAKE ONE (1) TABLET EACH DAY 30 tablet 0  . potassium chloride SA (KLOR-CON) 20 MEQ tablet TAKE TWO TABLETS BY MOUTH DAILY 180 tablet 2  . trimethoprim-polymyxin b (POLYTRIM) ophthalmic solution Place 1 drop into the right eye every 6 (six) hours. 10 mL 0   No facility-administered medications prior to visit.    No Known Allergies  Review of Systems  Constitutional: Negative.  Negative for appetite change and fatigue.  Eyes: Negative for blurred vision, pain and visual disturbance.  Respiratory: Negative.  Negative for cough, chest tightness, shortness of breath and wheezing.   Cardiovascular: Negative.  Negative for chest pain, palpitations and leg swelling.  Gastrointestinal: Negative.  Negative for abdominal pain, diarrhea, nausea and vomiting.  Genitourinary: Negative.   Musculoskeletal: Negative for muscle weakness.  Skin: Negative.  Negative for color change and rash.  Neurological: Negative.  Negative for weakness, numbness and  headaches.  Psychiatric/Behavioral: Negative.        Objective:    Physical Exam Vitals and nursing note reviewed.  Constitutional:      General: He is not in acute distress.    Appearance: He is well-developed.  HENT:     Head: Normocephalic and atraumatic.  Eyes:     Conjunctiva/sclera:     Right eye: Right conjunctiva is injected.     Left eye: Left conjunctiva is injected.     Pupils: Pupils are equal, round, and reactive to light.  Cardiovascular:     Rate and Rhythm: Normal rate and regular rhythm.     Heart sounds: Normal heart sounds.  Pulmonary:     Effort: Pulmonary effort is normal. No respiratory distress.     Breath sounds: Normal breath sounds.  Skin:    General: Skin is warm and dry.  Psychiatric:        Behavior: Behavior normal.     BP 100/72   Pulse 81   Temp 98.7 F (37.1 C)  (Temporal)   Ht '6\' 3"'  (1.905 m)   Wt 287 lb (130.2 kg)   SpO2 97%   BMI 35.87 kg/m  Wt Readings from Last 3 Encounters:  04/29/19 287 lb (130.2 kg)  03/18/19 293 lb (132.9 kg)  01/10/19 296 lb (134.3 kg)    Health Maintenance Due  Topic Date Due  . FOOT EXAM  01/11/1982  . OPHTHALMOLOGY EXAM  01/11/1982  . URINE MICROALBUMIN  01/11/1982  . TETANUS/TDAP  01/12/1991    There are no preventive care reminders to display for this patient.   Lab Results  Component Value Date   TSH 2.230 11/15/2018   Lab Results  Component Value Date   WBC 8.5 11/15/2018   HGB 13.9 11/15/2018   HCT 41.6 11/15/2018   MCV 87 11/15/2018   PLT 216 11/15/2018   Lab Results  Component Value Date   NA 134 (L) 01/10/2019   K 4.6 01/10/2019   CO2 26 01/10/2019   GLUCOSE 297 (H) 01/10/2019   BUN 18 01/10/2019   CREATININE 1.48 01/10/2019   BILITOT 0.5 01/10/2019   ALKPHOS 39 01/10/2019   AST 15 01/10/2019   ALT 21 01/10/2019   PROT 7.3 01/10/2019   ALBUMIN 4.0 01/10/2019   CALCIUM 9.1 01/10/2019   ANIONGAP 11 09/09/2018   GFR 50.95 (L) 01/10/2019   Lab Results  Component Value Date   CHOL 126 11/15/2018   Lab Results  Component Value Date   HDL 38 (L) 11/15/2018   Lab Results  Component Value Date   LDLCALC 30 11/15/2018   Lab Results  Component Value Date   TRIG 292 (H) 11/15/2018   Lab Results  Component Value Date   CHOLHDL 3.3 11/15/2018   Lab Results  Component Value Date   HGBA1C 8.0 (H) 11/15/2018       Assessment & Plan:   Problem List Items Addressed This Visit      Cardiovascular and Mediastinum   Essential hypertension   Relevant Orders   CBC with Differential/Platelet   CMP14+EGFR   Lipid panel   Dilated cardiomyopathy ([facility])   Relevant Orders   CBC with Differential/Platelet   CMP14+EGFR   Lipid panel   Chronic systolic heart failure ([facility]) - Primary   Relevant Orders   Ambulatory referral to Ophthalmology     Respiratory   Chronic  nonseasonal allergic rhinitis due to pollen     Endocrine   Type 2 diabetes mellitus  without complication, without long-term current use of insulin ([facility])   Relevant Orders   TSH   Bayer DCA Hb A1c Waived   Ambulatory referral to Ophthalmology     Other   Autism   Obesity (BMI 30-39.9)   Relevant Orders   CBC with Differential/Platelet   CMP14+EGFR   Lipid panel   TSH   Bayer DCA Hb A1c Waived    Other Visit Diagnoses    Well adult exam       Relevant Orders   CBC with Differential/Platelet   CMP14+EGFR   Lipid panel   TSH   Bayer DCA Hb A1c Waived   Redness of both eyes       Relevant Orders   Ambulatory referral to Ophthalmology       No orders of the defined types were placed in this encounter.    Terald Sleeper, PA-C   Terald Sleeper PA-C [facility] [address]  [place], [place] [address] [phone]

## 2019-04-30 LAB — CMP14+EGFR
ALT: 24 IU/L (ref 0–44)
AST: 18 IU/L (ref 0–40)
Albumin/Globulin Ratio: 1.6 (ref 1.2–2.2)
Albumin: 4.2 g/dL (ref 4.0–5.0)
Alkaline Phosphatase: 41 IU/L (ref 39–117)
BUN/Creatinine Ratio: 10 (ref 9–20)
BUN: 14 mg/dL (ref 6–24)
Bilirubin Total: 0.3 mg/dL (ref 0.0–1.2)
CO2: 23 mmol/L (ref 20–29)
Calcium: 9.1 mg/dL (ref 8.7–10.2)
Chloride: 102 mmol/L (ref 96–106)
Creatinine, Ser: 1.36 mg/dL — ABNORMAL HIGH (ref 0.76–1.27)
GFR calc Af Amer: 71 mL/min/{1.73_m2} (ref >59)
GFR calc non Af Amer: 62 mL/min/{1.73_m2} (ref >59)
Globulin, Total: 2.6 g/dL (ref 1.5–4.5)
Glucose: 160 mg/dL — ABNORMAL HIGH (ref 65–99)
Potassium: 4.7 mmol/L (ref 3.5–5.2)
Sodium: 137 mmol/L (ref 134–144)
Total Protein: 6.8 g/dL (ref 6.0–8.5)

## 2019-04-30 LAB — CBC WITH DIFFERENTIAL/PLATELET
Basophils Absolute: 0.1 10*3/uL (ref 0.0–0.2)
Basos: 1 %
EOS (ABSOLUTE): 0.3 10*3/uL (ref 0.0–0.4)
Eos: 4 %
Hematocrit: 44.2 % (ref 37.5–51.0)
Hemoglobin: 14.8 g/dL (ref 13.0–17.7)
Immature Grans (Abs): 0 10*3/uL (ref 0.0–0.1)
Immature Granulocytes: 0 %
Lymphocytes Absolute: 1.9 10*3/uL (ref 0.7–3.1)
Lymphs: 26 %
MCH: 28.4 pg (ref 26.6–33.0)
MCHC: 33.5 g/dL (ref 31.5–35.7)
MCV: 85 fL (ref 79–97)
Monocytes Absolute: 0.6 10*3/uL (ref 0.1–0.9)
Monocytes: 9 %
Neutrophils Absolute: 4.3 10*3/uL (ref 1.4–7.0)
Neutrophils: 60 %
Platelets: 216 10*3/uL (ref 150–450)
RBC: 5.22 x10E6/uL (ref 4.14–5.80)
RDW: 12.8 % (ref 11.6–15.4)
WBC: 7.1 10*3/uL (ref 3.4–10.8)

## 2019-04-30 LAB — LIPID PANEL
Chol/HDL Ratio: 2.9 ratio (ref 0.0–5.0)
Cholesterol, Total: 108 mg/dL (ref 100–199)
HDL: 37 mg/dL — ABNORMAL LOW (ref >39)
LDL Chol Calc (NIH): 38 mg/dL (ref 0–99)
Triglycerides: 206 mg/dL — ABNORMAL HIGH (ref 0–149)
VLDL Cholesterol Cal: 33 mg/dL (ref 5–40)

## 2019-04-30 LAB — TSH: TSH: 1.45 u[IU]/mL (ref 0.450–4.500)

## 2019-05-05 ENCOUNTER — Other Ambulatory Visit: Payer: Self-pay | Admitting: Physician Assistant

## 2019-05-05 ENCOUNTER — Telehealth: Payer: Self-pay | Admitting: Physician Assistant

## 2019-05-05 MED ORDER — METFORMIN HCL 500 MG PO TABS: 500.0000 mg | ORAL_TABLET | Freq: Every day | ORAL | 1 refills | Status: DC

## 2019-05-05 MED ORDER — JARDIANCE 25 MG PO TABS: 25.0000 mg | ORAL_TABLET | Freq: Every day | ORAL | 5 refills | Status: DC

## 2019-05-06 ENCOUNTER — Other Ambulatory Visit: Payer: Self-pay | Admitting: Physician Assistant

## 2019-05-16 DIAGNOSIS — H0288B Meibomian gland dysfunction left eye, upper and lower eyelids: Secondary | ICD-10-CM | POA: Diagnosis not present

## 2019-05-16 DIAGNOSIS — H0288A Meibomian gland dysfunction right eye, upper and lower eyelids: Secondary | ICD-10-CM | POA: Diagnosis not present

## 2019-05-16 DIAGNOSIS — H0102A Squamous blepharitis right eye, upper and lower eyelids: Secondary | ICD-10-CM | POA: Diagnosis not present

## 2019-05-16 DIAGNOSIS — E119 Type 2 diabetes mellitus without complications: Secondary | ICD-10-CM | POA: Diagnosis not present

## 2019-05-16 DIAGNOSIS — H0102B Squamous blepharitis left eye, upper and lower eyelids: Secondary | ICD-10-CM | POA: Diagnosis not present

## 2019-05-16 DIAGNOSIS — H1045 Other chronic allergic conjunctivitis: Secondary | ICD-10-CM | POA: Diagnosis not present

## 2019-05-16 LAB — HM DIABETES EYE EXAM

## 2019-05-20 ENCOUNTER — Telehealth: Payer: Self-pay | Admitting: Physician Assistant

## 2019-05-20 NOTE — Chronic Care Management (AMB) (Signed)
Chronic Care Management   Note  05/20/2019 Name: Michael Ramos MRN: [id number] DOB: 25-Jun-1970  [name] is a 49 y.o. year old male who is a primary care patient of Terald Sleeper, PA-C. I reached out to [name] by phone today and spoke with his sister [name]  in response to a referral sent by Mr. Michaeljoseph Revolorio Weipert's health plan.         [name] was given information about Chronic Care Management services today including:  1. CCM service includes personalized support from designated clinical staff supervised by his physician, including individualized plan of care and coordination with other care providers 2. 24/7 contact phone numbers for assistance for urgent and routine care needs. 3. Service will only be billed when office clinical staff spend 20 minutes or more in a month to coordinate care. 4. Only one practitioner may furnish and bill the service in a calendar month. 5. The patient may stop CCM services at any time (effective at the end of the month) by phone call to the office staff. 6. The patient will be responsible for cost sharing (co-pay) of up to 20% of the service fee (after annual deductible is met).  Patient sister [name] did not agree to enrollment in care management services and does not wish to consider at this time.  Follow up plan: The patient's sister [name] has been provided with contact information for the care management team and has been advised to call with any health related questions or concerns.   Noreene Larsson, [place], [address], [place] [address] Direct Dial: [phone] [email or web address] Website: [email or web address]

## 2019-05-26 ENCOUNTER — Telehealth: Payer: Self-pay | Admitting: Cardiology

## 2019-05-26 ENCOUNTER — Telehealth: Payer: Self-pay | Admitting: *Deleted

## 2019-05-26 NOTE — Telephone Encounter (Signed)
With everything else being so stable I do think it is possible Michael Ramos is creating the side effect.  Please contact his cardiologist

## 2019-05-26 NOTE — Telephone Encounter (Signed)
Sister states that they have already contacted Dr. Harl Bowie and he told patient to continue taking the [facility].  Please advise

## 2019-05-26 NOTE — Telephone Encounter (Signed)
Per phone call from pt's mother-- pt's BP is 133/76 and his Heart rate is 96-- last night the pt told his mother that he felt jittery/shakey   Please call [phone]

## 2019-05-26 NOTE — Telephone Encounter (Signed)
Mother had earlier Hr of 81, states BS was normal too. He appears fine now and they are planning to got to town for supplies.Mom wil call me back with any changes

## 2019-05-26 NOTE — Telephone Encounter (Signed)
Was the information obtained from the nurse or his physician?  The Delene Loll is a very important medicine for him to take, and if the side effects will get better with time, it is worth continuing the [place].  However things get much worse I would let the cardiologist know again

## 2019-05-26 NOTE — Telephone Encounter (Signed)
Attempt to reach, had to leave message-cc

## 2019-05-26 NOTE — Telephone Encounter (Signed)
Sister aware and verbalizes understanding per dpr. States she talked to his nurse but would contact them again if he does not improve.

## 2019-05-26 NOTE — Telephone Encounter (Signed)
Mother said pt is kink of "jumpy". She checked his pulse last night and it was 81, this morning it is 96. Bp is good, BS is good. She thinks it is the entresto? Please advise

## 2019-05-26 NOTE — Telephone Encounter (Signed)
Mom aware.

## 2019-06-09 ENCOUNTER — Other Ambulatory Visit: Payer: Self-pay | Admitting: Physician Assistant

## 2019-07-07 ENCOUNTER — Other Ambulatory Visit: Payer: Self-pay | Admitting: Family Medicine

## 2019-07-07 MED ORDER — PANTOPRAZOLE SODIUM 40 MG PO TBEC: DELAYED_RELEASE_TABLET | ORAL | 0 refills | Status: DC

## 2019-07-17 DIAGNOSIS — Z23 Encounter for immunization: Secondary | ICD-10-CM | POA: Diagnosis not present

## 2019-07-20 IMAGING — CR DG CHEST 2V
2 series · 2 of 2 positions shown · non-contrast
Comparison: August 18, 2016

CLINICAL DATA: Preoperative evaluation for hip surgery

EXAM:
CHEST - 2 VIEW

[w chest lat]
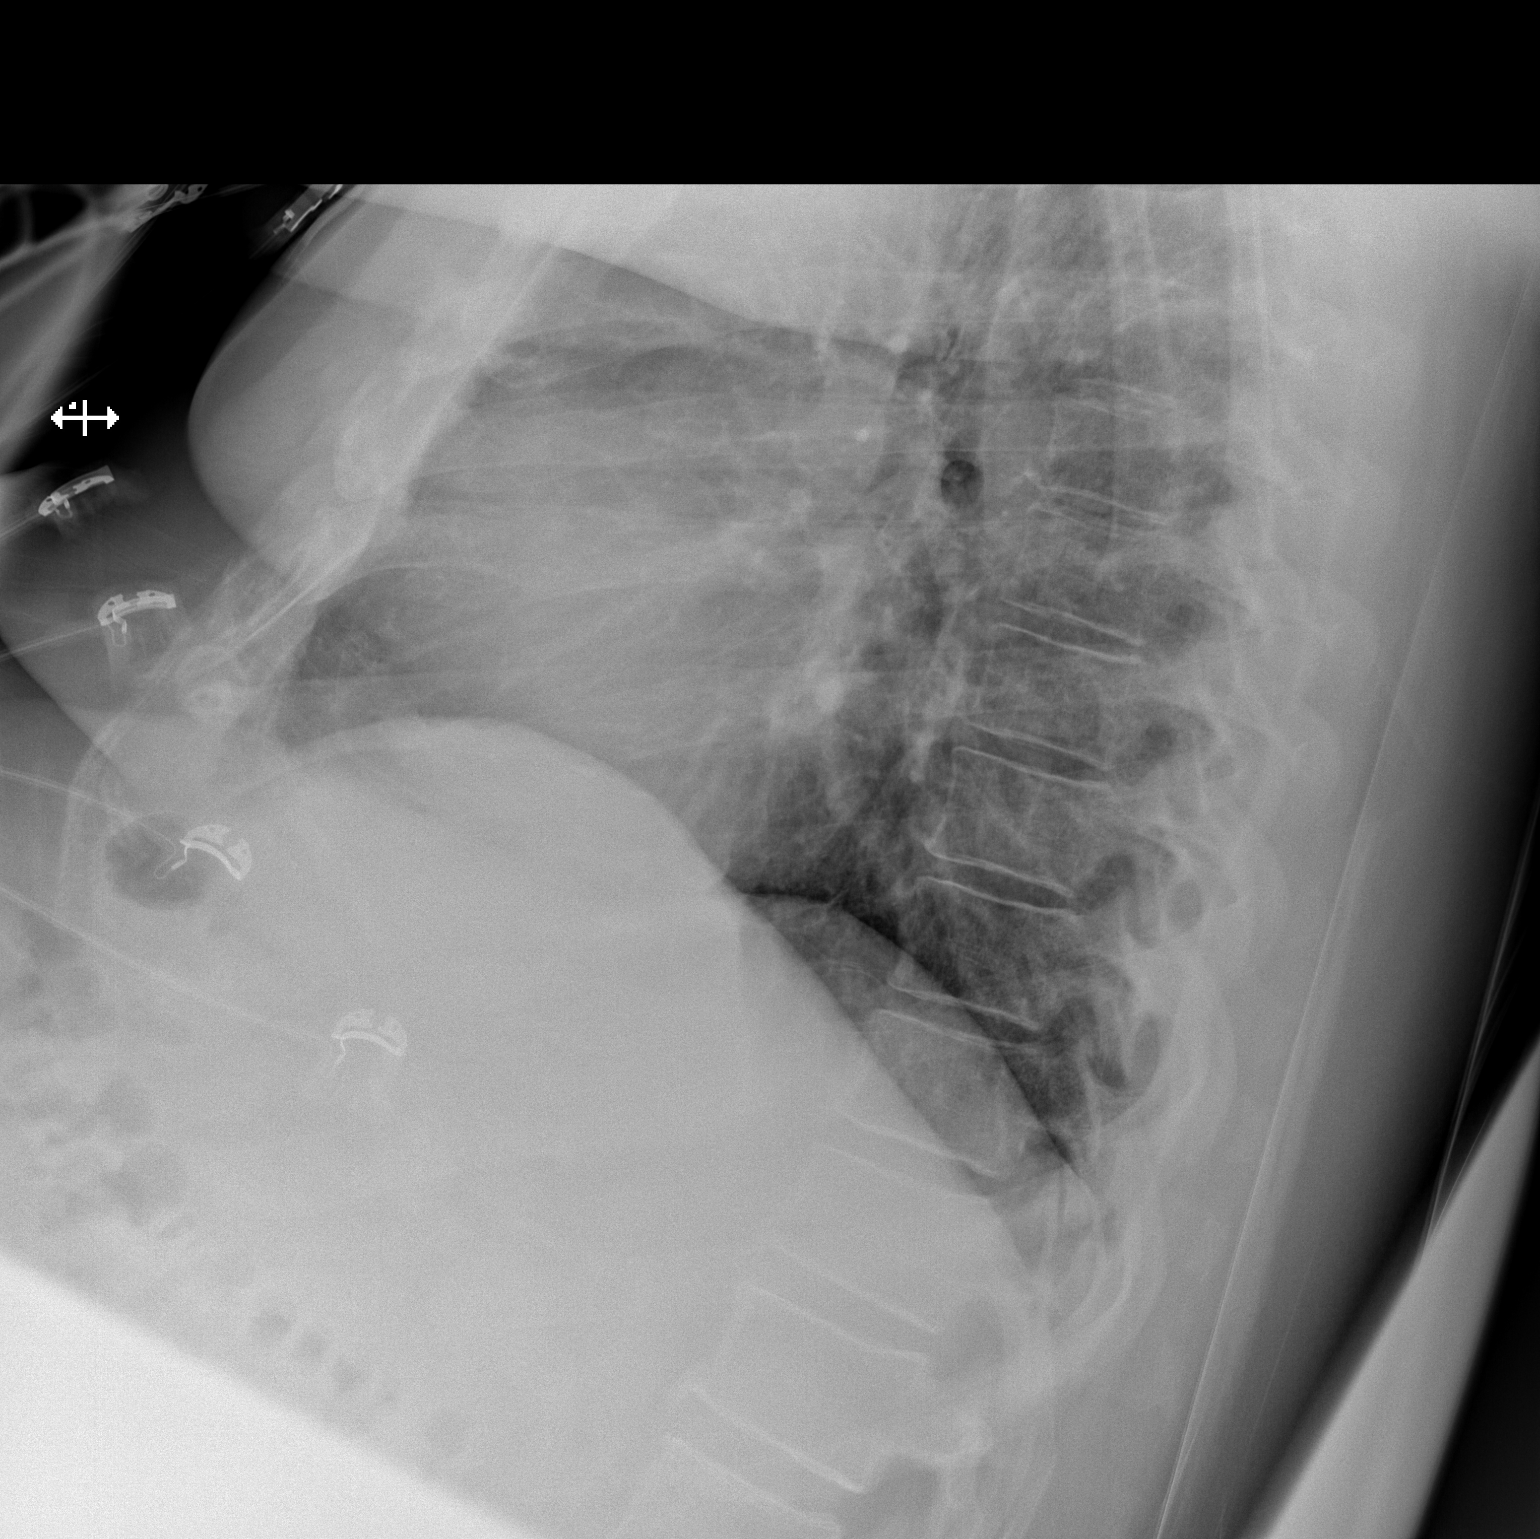

[x chest ap]
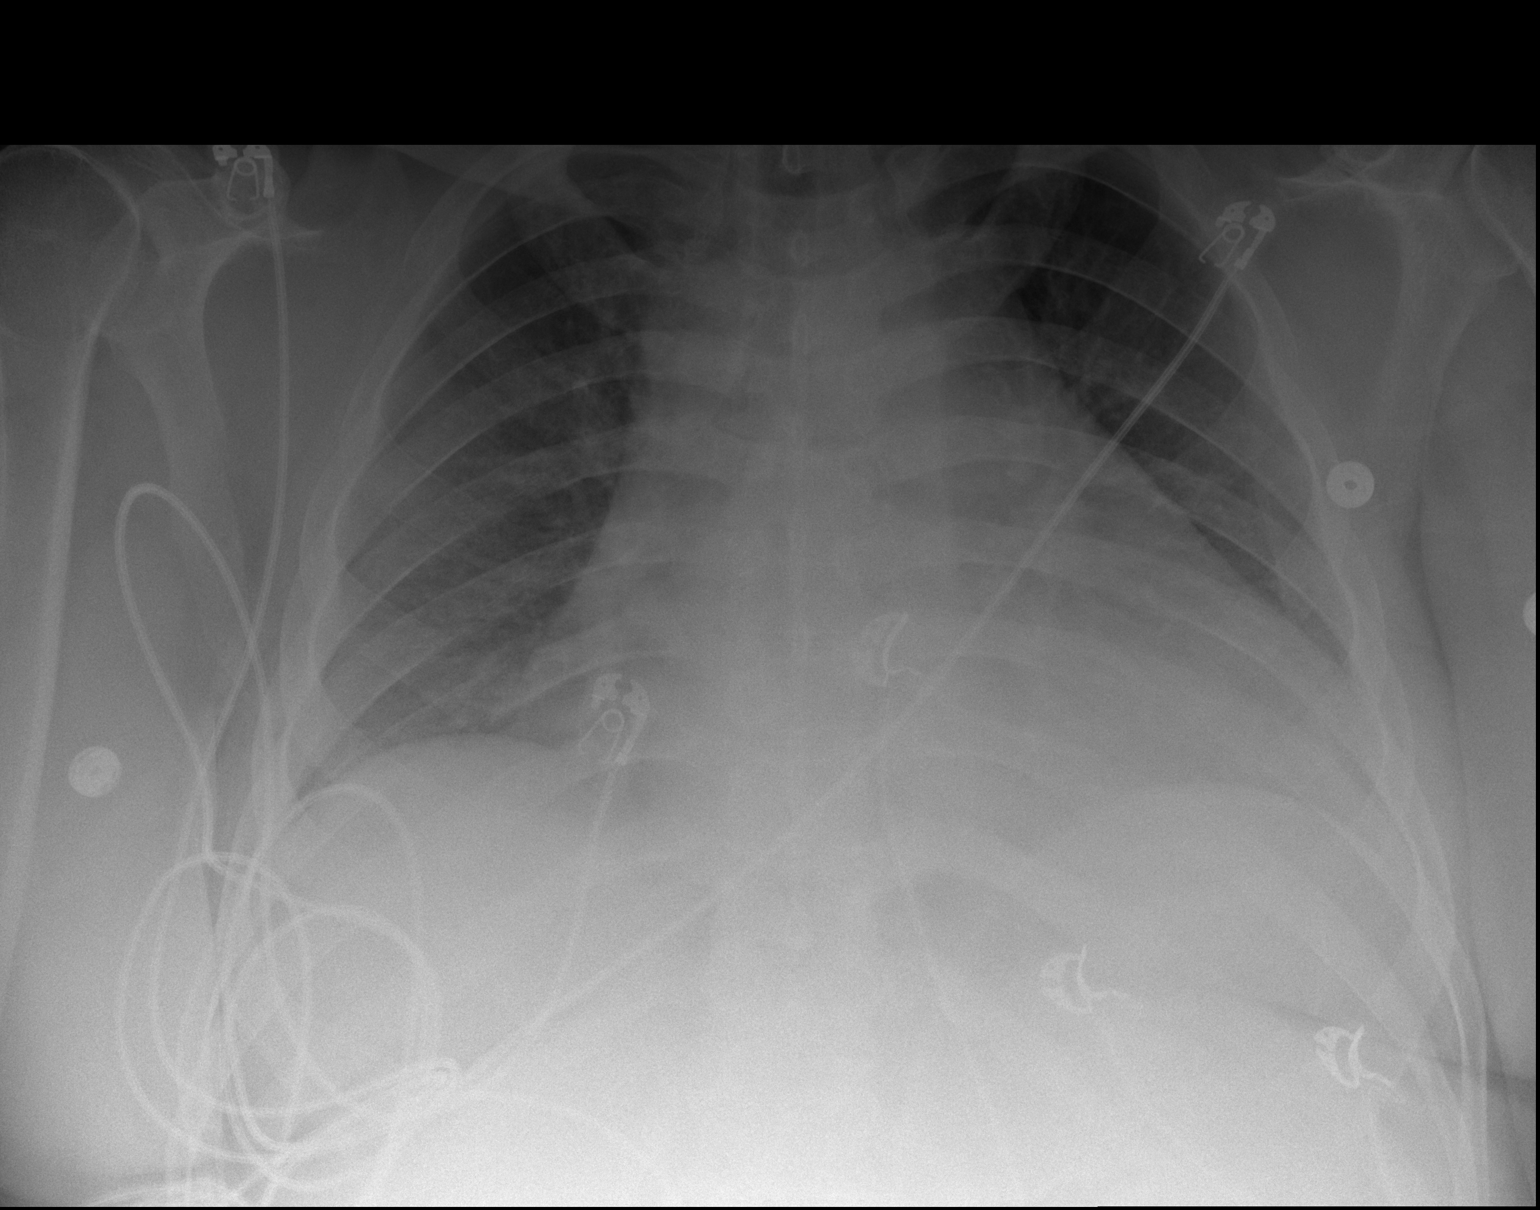

[2 of 2 positions shown; findings below may reference images not displayed]

FINDINGS: No edema or consolidation. Heart is mildly enlarged with pulmonary
vascularity normal. No adenopathy. No bone lesions. No pneumothorax.
IMPRESSION: Mild cardiac enlargement.  No edema or consolidation.

## 2019-07-28 ENCOUNTER — Other Ambulatory Visit: Payer: Self-pay | Admitting: Cardiology

## 2019-08-04 ENCOUNTER — Other Ambulatory Visit: Payer: Self-pay | Admitting: Family Medicine

## 2019-08-08 DIAGNOSIS — Z23 Encounter for immunization: Secondary | ICD-10-CM | POA: Diagnosis not present

## 2019-08-26 ENCOUNTER — Other Ambulatory Visit: Payer: Self-pay

## 2019-08-26 MED ORDER — METFORMIN HCL 500 MG PO TABS: 500.0000 mg | ORAL_TABLET | Freq: Every day | ORAL | 0 refills | Status: DC

## 2019-09-01 ENCOUNTER — Other Ambulatory Visit: Payer: Self-pay | Admitting: Family

## 2019-09-10 ENCOUNTER — Telehealth: Payer: Self-pay | Admitting: Cardiology

## 2019-09-10 NOTE — Telephone Encounter (Signed)
  Patient Consent for Virtual Visit         DAVINE SANANDRES has provided verbal consent on 09/10/2019 for a virtual visit (video or telephone).   CONSENT FOR VIRTUAL VISIT FOR:  [name]  By participating in this virtual visit I agree to the following:  I hereby voluntarily request, consent and authorize [facility] and its employed or contracted physicians, physician assistants, nurse practitioners or other licensed health care professionals (the Practitioner), to provide me with telemedicine health care services (the "Services") as deemed necessary by the treating Practitioner. I acknowledge and consent to receive the Services by the Practitioner via telemedicine. I understand that the telemedicine visit will involve communicating with the Practitioner through live audiovisual communication technology and the disclosure of certain medical information by electronic transmission. I acknowledge that I have been given the opportunity to request an in-person assessment or other available alternative prior to the telemedicine visit and am voluntarily participating in the telemedicine visit.  I understand that I have the right to withhold or withdraw my consent to the use of telemedicine in the course of my care at any time, without affecting my right to future care or treatment, and that the Practitioner or I may terminate the telemedicine visit at any time. I understand that I have the right to inspect all information obtained and/or recorded in the course of the telemedicine visit and may receive copies of available information for a reasonable fee.  I understand that some of the potential risks of receiving the Services via telemedicine include:  Marland Kitchen Delay or interruption in medical evaluation due to technological equipment failure or disruption; . Information transmitted may not be sufficient (e.g. poor resolution of images) to allow for appropriate medical decision making by the  Practitioner; and/or  . In rare instances, security protocols could fail, causing a breach of personal health information.  Furthermore, I acknowledge that it is my responsibility to provide information about my medical history, conditions and care that is complete and accurate to the best of my ability. I acknowledge that Practitioner's advice, recommendations, and/or decision may be based on factors not within their control, such as incomplete or inaccurate data provided by me or distortions of diagnostic images or specimens that may result from electronic transmissions. I understand that the practice of medicine is not an exact science and that Practitioner makes no warranties or guarantees regarding treatment outcomes. I acknowledge that a copy of this consent can be made available to me via my patient portal ([facility]), or I can request a printed copy by calling the office of [facility].    I understand that my insurance will be billed for this visit.   I have read or had this consent read to me. . I understand the contents of this consent, which adequately explains the benefits and risks of the Services being provided via telemedicine.  . I have been provided ample opportunity to ask questions regarding this consent and the Services and have had my questions answered to my satisfaction. . I give my informed consent for the services to be provided through the use of telemedicine in my medical care

## 2019-09-15 ENCOUNTER — Encounter: Payer: Self-pay | Admitting: Cardiology

## 2019-09-15 ENCOUNTER — Telehealth (INDEPENDENT_AMBULATORY_CARE_PROVIDER_SITE_OTHER): Payer: Medicare Other | Admitting: Cardiology

## 2019-09-15 VITALS — BP 125/84 | HR 79 | Ht 75.0 in | Wt 288.0 lb

## 2019-09-15 DIAGNOSIS — I5022 Chronic systolic (congestive) heart failure: Secondary | ICD-10-CM

## 2019-09-15 DIAGNOSIS — I428 Other cardiomyopathies: Secondary | ICD-10-CM | POA: Diagnosis not present

## 2019-09-15 NOTE — Progress Notes (Signed)
Virtual Visit via Telephone Note   This visit type was conducted due to national recommendations for restrictions regarding the COVID-19 Pandemic (e.g. social distancing) in an effort to limit this patient's exposure and mitigate transmission in our community.  Due to his co-morbid illnesses, this patient is at least at moderate risk for complications without adequate follow up.  This format is felt to be most appropriate for this patient at this time.  The patient did not have access to video technology/had technical difficulties with video requiring transitioning to audio format only (telephone).  All issues noted in this document were discussed and addressed.  No physical exam could be performed with this format.  Please refer to the patient's chart for his  consent to telehealth for [facility].   The patient was identified using 2 identifiers.  Date:  09/15/2019   ID:  Michael Ramos, DOB 17-Aug-1970, MRN [id number]  Patient Location: Home Provider Location: Office  PCP:  Janora Norlander, DO  Cardiologist:  Carlyle Dolly, MD  Electrophysiologist:  None   Evaluation Performed:  Follow-Up Visit  Chief Complaint:  Follow up  History of Present Illness:    Michael Ramos is a 49 y.o. male seen today for follow up of the following medical problems.    1. Chronic systolic HF/NICM - new diagnosis during 08/2016 admission with fluid overload - 02/2017 cath no significant CAD.  07/2017 echo LVEF 35-40%, grade II diastolic dysfunction    - no recent SOB or DOE - episode of dizziness about 1 month ago while sitting. BP was normal on check. Blood sugar  About 2-3 episodes.      2. Nocturnal hypoxia/Possible sleep apnea - followed by Dr Radford Pax - drop in O2 sats noted during home oximetry. There were discussions about a formal sleep study however family does not feel patient could cooperate with study due to his auticsion   3. Autism    SH: Has 7  brothers and sisters. Has completed covid vaccine x 2.   The patient does not have symptoms concerning for COVID-19 infection (fever, chills, cough, or new shortness of breath).    Past Medical History:  Diagnosis Date  . Allergy   . Autism   . CHF (congestive heart failure) ([place])    a. EF 20-25% by echo in 08/2016 b. repeat echo in 02/2017 showing EF 30-35% with cath showing normal cors.   . Diabetes mellitus without complication ([place])   . Obesity (BMI 30-39.9) 10/23/2017   Past Surgical History:  Procedure Laterality Date  . RIGHT/LEFT HEART CATH AND CORONARY ANGIOGRAPHY N/A 03/06/2017   Procedure: RIGHT/LEFT HEART CATH AND CORONARY ANGIOGRAPHY;  Surgeon: Burnell Blanks, MD;  Location: [facility];  Service: Cardiovascular;  Laterality: N/A;     No outpatient medications have been marked as taking for the 09/15/19 encounter (Appointment) with Arnoldo Lenis, MD.     Allergies:   Patient has no known allergies.   Social History   Tobacco Use  . Smoking status: Never Smoker  . Smokeless tobacco: Never Used  Substance Use Topics  . Alcohol use: No  . Drug use: No     Family Hx: The patient's family history includes Bladder Cancer in his father; Diabetes in his father and mother. There is no history of Michael cancer, Esophageal cancer, Liver cancer, or Stomach cancer.  ROS:   Please see the history of present illness.     All other systems reviewed and are  negative.   Prior CV studies:   The following studies were reviewed today:  02/2017 cath  Hemodynamic findings consistent with mild pulmonary hypertension.  1. No angiographic evidence of CAD 2. Non-ischemic cardiomyopathy  Recommendations: medical management of cardiomyopathy and CHF.   02/2017 echo Study Conclusions  - Left ventricle: The cavity size was mildly dilated. Wall thickness was normal. Systolic function was moderately to severely reduced. The estimated ejection  fraction was in the range of 30% to 35%. Diffuse hypokinesis. Diastolic dysfunction, grade indeterminate. Indeterminate filling pressures. - Mitral valve: There was mild regurgitation.   07/2017 echo Study Conclusions  - Left ventricle: The cavity size was mildly dilated. Wall thickness was normal. Systolic function was moderately reduced. The estimated ejection fraction was in the range of 35% to 40%. Diffuse hypokinesis. Features are consistent with a pseudonormal left ventricular filling pattern, with concomitant abnormal relaxation and increased filling pressure (grade 2 diastolic dysfunction). - Mitral valve: There was mild regurgitation.  Labs/Other Tests and Data Reviewed:    EKG:  No ECG reviewed.  Recent Labs: 04/29/2019: ALT 24; BUN 14; Creatinine, Ser 1.36; Hemoglobin 14.8; Platelets 216; Potassium 4.7; Sodium 137; TSH 1.450   Recent Lipid Panel Lab Results  Component Value Date/Time   CHOL 108 04/29/2019 04:36 PM   TRIG 206 (H) 04/29/2019 04:36 PM   HDL 37 (L) 04/29/2019 04:36 PM   CHOLHDL 2.9 04/29/2019 04:36 PM   LDLCALC 38 04/29/2019 04:36 PM    Wt Readings from Last 3 Encounters:  04/29/19 287 lb (130.2 kg)  03/18/19 293 lb (132.9 kg)  01/10/19 296 lb (134.3 kg)     Objective:    Vital Signs:   Today's Vitals   09/15/19 1019 09/15/19 1030  BP:  125/84  Pulse:  79  Weight: 288 lb (130.6 kg)   Height: 6\' 3"  (1.905 m)    Body mass index is 36 kg/m. Normal affect. Normal speech pattern and tone. Comfortable, no apparent distress. No audible signs of sob or wheezing.   ASSESSMENT & PLAN:    1. Chronic systolic HF/ NICM -medical therapy limited by his renal function with mild uptredn in Crpreviously, also soft bp'. Bps more recently have been normal, renal function stable - repeat echo, if ongoing dysfunction would consider adjusting meds. Likely start low dose aldactone   F/u 6 months  COVID-19 Education: The signs and  symptoms of COVID-19 were discussed with the patient and how to seek care for testing (follow up with PCP or arrange E-visit).  The importance of social distancing was discussed today.  Time:   Today, I have spent 15 minutes with the patient with telehealth technology discussing the above problems.     Medication Adjustments/Labs and Tests Ordered: Current medicines are reviewed at length with the patient today.  Concerns regarding medicines are outlined above.   Tests Ordered: No orders of the defined types were placed in this encounter.   Medication Changes: No orders of the defined types were placed in this encounter.   Follow Up:  Either In Person or Virtual in 6 month(s)  Signed, Carlyle Dolly, MD  09/15/2019 9:05 AM    [place]

## 2019-09-15 NOTE — Patient Instructions (Signed)
Your physician wants you to follow-up in: 6 MONTHS WITH DR. BRANCH You will receive a reminder letter in the mail two months in advance. If you don't receive a letter, please call our office to schedule the follow-up appointment.  Your physician recommends that you continue on your current medications as directed. Please refer to the Current Medication list given to you today.  Your physician has requested that you have an echocardiogram. Echocardiography is a painless test that uses sound waves to create images of your heart. It provides your doctor with information about the size and shape of your heart and how well your heart's chambers and valves are working. This procedure takes approximately one hour. There are no restrictions for this procedure.  Thank you for choosing [facility]!!   

## 2019-09-15 NOTE — Addendum Note (Signed)
Addended by: Julian Hy T on: 09/15/2019 10:45 AM   Modules accepted: Orders

## 2019-09-17 DIAGNOSIS — D485 Neoplasm of uncertain behavior of skin: Secondary | ICD-10-CM | POA: Diagnosis not present

## 2019-09-17 DIAGNOSIS — D2272 Melanocytic nevi of left lower limb, including hip: Secondary | ICD-10-CM | POA: Diagnosis not present

## 2019-09-17 DIAGNOSIS — L981 Factitial dermatitis: Secondary | ICD-10-CM | POA: Diagnosis not present

## 2019-10-01 DIAGNOSIS — D485 Neoplasm of uncertain behavior of skin: Secondary | ICD-10-CM | POA: Diagnosis not present

## 2019-10-01 DIAGNOSIS — L981 Factitial dermatitis: Secondary | ICD-10-CM | POA: Diagnosis not present

## 2019-10-07 ENCOUNTER — Ambulatory Visit (INDEPENDENT_AMBULATORY_CARE_PROVIDER_SITE_OTHER): Payer: Medicare Other

## 2019-10-07 DIAGNOSIS — I5022 Chronic systolic (congestive) heart failure: Secondary | ICD-10-CM

## 2019-10-16 ENCOUNTER — Telehealth: Payer: Self-pay | Admitting: *Deleted

## 2019-10-16 NOTE — Telephone Encounter (Signed)
-----   Message from Arnoldo Lenis, MD sent at 10/15/2019  9:40 AM EDT ----- Echo looks good, heart function has normalized and is pumping well which is great news. No med changes at this time   Zandra Abts MD

## 2019-10-16 NOTE — Telephone Encounter (Signed)
[name] [facility]) made aware - routed to pcp

## 2019-10-20 ENCOUNTER — Other Ambulatory Visit: Payer: Self-pay | Admitting: Cardiology

## 2019-10-27 ENCOUNTER — Other Ambulatory Visit: Payer: Self-pay | Admitting: Family

## 2019-10-28 ENCOUNTER — Telehealth: Payer: Self-pay | Admitting: *Deleted

## 2019-10-28 MED ORDER — EMPAGLIFLOZIN 25 MG PO TABS: 25.0000 mg | ORAL_TABLET | Freq: Every day | ORAL | 0 refills | Status: DC

## 2019-10-28 NOTE — Telephone Encounter (Signed)
30 day supply given pt has appt with PCP 10/31/19

## 2019-10-31 ENCOUNTER — Ambulatory Visit (INDEPENDENT_AMBULATORY_CARE_PROVIDER_SITE_OTHER): Payer: Medicare Other | Admitting: Family Medicine

## 2019-10-31 ENCOUNTER — Encounter: Payer: Self-pay | Admitting: Family Medicine

## 2019-10-31 ENCOUNTER — Other Ambulatory Visit: Payer: Self-pay

## 2019-10-31 VITALS — BP 113/79 | HR 79 | Temp 96.1°F | Ht 75.0 in | Wt 279.6 lb

## 2019-10-31 DIAGNOSIS — E785 Hyperlipidemia, unspecified: Secondary | ICD-10-CM

## 2019-10-31 DIAGNOSIS — I10 Essential (primary) hypertension: Secondary | ICD-10-CM | POA: Diagnosis not present

## 2019-10-31 DIAGNOSIS — I152 Hypertension secondary to endocrine disorders: Secondary | ICD-10-CM

## 2019-10-31 DIAGNOSIS — E119 Type 2 diabetes mellitus without complications: Secondary | ICD-10-CM | POA: Diagnosis not present

## 2019-10-31 DIAGNOSIS — I5022 Chronic systolic (congestive) heart failure: Secondary | ICD-10-CM | POA: Diagnosis not present

## 2019-10-31 DIAGNOSIS — E669 Obesity, unspecified: Secondary | ICD-10-CM

## 2019-10-31 DIAGNOSIS — I428 Other cardiomyopathies: Secondary | ICD-10-CM

## 2019-10-31 DIAGNOSIS — E1169 Type 2 diabetes mellitus with other specified complication: Secondary | ICD-10-CM

## 2019-10-31 DIAGNOSIS — E1159 Type 2 diabetes mellitus with other circulatory complications: Secondary | ICD-10-CM

## 2019-10-31 DIAGNOSIS — R350 Frequency of micturition: Secondary | ICD-10-CM

## 2019-10-31 LAB — URINALYSIS
Bilirubin, UA: NEGATIVE
Ketones, UA: NEGATIVE
Leukocytes,UA: NEGATIVE
Nitrite, UA: NEGATIVE
Specific Gravity, UA: 1.015 (ref 1.005–1.030)
Urobilinogen, Ur: 0.2 mg/dL (ref 0.2–1.0)
pH, UA: 5 (ref 5.0–7.5)

## 2019-10-31 LAB — BAYER DCA HB A1C WAIVED: HB A1C (BAYER DCA - WAIVED): 7.5 % — ABNORMAL HIGH (ref <7.0)

## 2019-10-31 MED ORDER — LINAGLIPTIN 5 MG PO TABS: 5.0000 mg | ORAL_TABLET | Freq: Every day | ORAL | 0 refills | Status: DC

## 2019-10-31 NOTE — Progress Notes (Signed)
Subjective: CC: est care, DM, CHF PCP: Janora Norlander, DO ONG:EXBMWUX L How[place] is a 49 y.o. male presenting to clinic today for:  1. Type 2 Diabetes w/ HTN, HLD, CHF:  Patient reports compliance with Jardiance 25 mg daily, glipizide 5 mg twice daily, metformin 500 mg daily (higher frequency/dosing caused excessive diarrhea).  He is also on Coreg 25 mg twice daily, Entresto twice daily.  Not on a statin.  He is followed by Dr. Harl Bowie for heart failure  Last eye exam: UTD Last foot exam: needs Last A1c:  Lab Results  Component Value Date   HGBA1C 8.8 (H) 04/29/2019   Nephropathy screen indicated?: on ARB with entresto Last flu, zoster and/or pneumovax:  Immunization History  Administered Date(s) Administered  . Influenza,inj,Quad PF,6+ Mos 01/18/2016, 01/10/2017, 01/31/2018  . PFIZER SARS-COV-2 Vaccination 07/17/2019, 08/08/2019  . Pneumococcal Polysaccharide-23 01/10/2017    ROS: Dizziness, LOC, polydipsia, unintended weight loss/gain, foot ulcerations, numbness or tingling in extremities, shortness of breath or chest pain.  Does have polyuria (8+ times per day that has been chronic); denies dysuria, hematuria or fevers   ROS: Per HPI  No Known Allergies Past Medical History:  Diagnosis Date  . Allergy   . Autism   . CHF (congestive heart failure) ([place])    a. EF 20-25% by echo in 08/2016 b. repeat echo in 02/2017 showing EF 30-35% with cath showing normal cors.   . Diabetes mellitus without complication ([place])   . Obesity (BMI 30-39.9) 10/23/2017    Current Outpatient Medications:  .  carvedilol (COREG) 25 MG tablet, TAKE ONE TABLET BY MOUTH TWICE DAILY, Disp: 180 tablet, Rfl: 3 .  diclofenac Sodium (VOLTAREN) 1 % GEL, Voltaren 1 % topical gel, Disp: , Rfl:  .  empagliflozin (JARDIANCE) 25 MG TABS tablet, Take 1 tablet (25 mg total) by mouth daily before breakfast., Disp: 30 tablet, Rfl: 0 .  ENTRESTO 49-51 MG, TAKE ONE TABLET BY MOUTH TWICE DAILY, Disp: 60 tablet,  Rfl: 6 .  furosemide (LASIX) 20 MG tablet, TAKE ONE (1) TABLET EACH DAY, Disp: 90 tablet, Rfl: 3 .  glipiZIDE (GLUCOTROL) 5 MG tablet, TAKE 1 TABLET TWICE A DAY BEFORE A MEAL, Disp: 180 tablet, Rfl: 3 .  loratadine (CLARITIN) 10 MG tablet, TAKE ONE (1) TABLET EACH DAY, Disp: 90 tablet, Rfl: 3 .  metFORMIN (GLUCOPHAGE) 500 MG tablet, Take 1 tablet (500 mg total) by mouth daily with breakfast., Disp: 90 tablet, Rfl: 0 .  pantoprazole (PROTONIX) 40 MG tablet, TAKE ONE (1) TABLET EACH DAY, Disp: 30 tablet, Rfl: 0 .  potassium chloride SA (KLOR-CON) 20 MEQ tablet, TAKE TWO TABLETS BY MOUTH DAILY, Disp: 180 tablet, Rfl: 2 Social History   Socioeconomic History  . Marital status: Single    Spouse name: Not on file  . Number of children: Not on file  . Years of education: Not on file  . Highest education level: 12th grade  Occupational History  . Occupation: Disabled  Tobacco Use  . Smoking status: Never Smoker  . Smokeless tobacco: Never Used  Vaping Use  . Vaping Use: Never used  Substance and Sexual Activity  . Alcohol use: No  . Drug use: No  . Sexual activity: Not Currently  Other Topics Concern  . Not on file  Social History Narrative   Autistic   Lives with Mother   Social Determinants of Health   Financial Resource Strain:   . Difficulty of Paying Living Expenses:   Food Insecurity:   .  Worried About Charity fundraiser in the Last Year:   . Arboriculturist in the Last Year:   Transportation Needs:   . Film/video editor (Medical):   Marland Kitchen Lack of Transportation (Non-Medical):   Physical Activity:   . Days of Exercise per Week:   . Minutes of Exercise per Session:   Stress:   . Feeling of Stress :   Social Connections:   . Frequency of Communication with Friends and Family:   . Frequency of Social Gatherings with Friends and Family:   . Attends Religious Services:   . Active Member of Clubs or Organizations:   . Attends Archivist Meetings:   Marland Kitchen Marital  Status:   Intimate Partner Violence:   . Fear of Current or Ex-Partner:   . Emotionally Abused:   Marland Kitchen Physically Abused:   . Sexually Abused:    Family History  Problem Relation Age of Onset  . Diabetes Mother   . Diabetes Father   . Bladder Cancer Father   . Colon cancer Neg Hx   . Esophageal cancer Neg Hx   . Liver cancer Neg Hx   . Stomach cancer Neg Hx     Objective: Office vital signs reviewed. BP 113/79   Pulse 79   Temp (!) 96.1 F (35.6 C)   Ht '6\' 3"'  (1.905 m)   Wt (!) 279 lb 9.6 oz (126.8 kg)   SpO2 96%   BMI 34.95 kg/m   Physical Examination:  General: Awake, alert, well nourished, No acute distress HEENT: Normal, sclera white, MMM Cardio: regular rate and rhythm, S1S2 heard, no murmurs appreciated Pulm: clear to auscultation bilaterally, no wheezes, rhonchi or rales; normal work of breathing on room air Extremities: warm, well perfused, No edema, cyanosis or clubbing; +2 pulses bilaterally MSK: ambulates independently. Normal tone Skin: dry; intact; no rashes or lesions Neuro: DM foot exam limited by cognition Diabetic Foot Exam - Simple   Simple Foot Form Diabetic Foot exam was performed with the following findings: Yes 10/31/2019  1:16 PM  Visual Inspection No deformities, no ulcerations, no other skin breakdown bilaterally: Yes Sensation Testing See comments: Yes Pulse Check Posterior Tibialis and Dorsalis pulse intact bilaterally: Yes Comments Unsure if sensation is intact.  Cognition limits ability to respond with regards to how he is feeling when sensory testing is performed.     Assessment/ Plan: 49 y.o. male   1. Type 2 diabetes mellitus with other specified complication, without long-term current use of insulin (HCC) A1c not at goal.  His A1c was slightly improved compared to previous visit but was still elevated at 7.5.  I have added Tradjenta 5 mg daily.  If he responds well to this medication we can put in a combo pill with the Jardiance as  Glyxambi to reduce pill burden.  Urine microalbumin was obtained today but technically patient does not require as he is on ARB with Entresto.  His foot exam was performed today. - Bayer DCA Hb A1c Waived - Microalbumin / creatinine urine ratio  2. Hyperlipidemia associated with type 2 diabetes mellitus ([place]) Not sure why this patient is not on a statin.  While his LDL is at goal, I would suspect that even a low-dose statin would be beneficial given cardiac history and type 2 diabetes.  We will check his cholesterol panel today and discuss this further with the family  3. Hypertension associated with diabetes ([place]) Controlled.  Continue current regimen - Lipid panel -  CMP14+EGFR  4. Chronic systolic heart failure (HCC) Continue to follow-up with Dr. Harl Bowie as scheduled  5. Non-ischemic cardiomyopathy (HCC)  6. Obesity (BMI 30-39.9) - Lipid panel  7. Urinary frequency Check PSA.  Urinalysis with 3+ blood.  I am sending this for urine culture given the absence of infectious findings.  Most certainly need to have the blood evaluated especially if urine culture is negative.  Plan for repeat urinalysis when he comes in for checkup with Almyra Free in 2 weeks. - PSA - Urinalysis - Urine Culture   Orders Placed This Encounter  Procedures  . Bayer DCA Hb A1c Waived  . Lipid panel  . CMP14+EGFR  . Microalbumin / creatinine urine ratio   Meds ordered this encounter  Medications  . linagliptin (TRADJENTA) 5 MG TABS tablet    Sig: Take 1 tablet (5 mg total) by mouth daily.    Dispense:  14 tablet    Refill:  [place], DO [place] [phone]

## 2019-10-31 NOTE — Patient Instructions (Addendum)
I am giving him samples of Trajenta today.  Take 1 tablet daily along with his other sugar medications.  It comes in a combo pill with the Jardiance so if he does well we can combine the pills so he only has to take 1 a day.  See Almyra Free in 2 weeks to follow up on how he is doing.  Keep a sugar log for me  Cortisone cream to the rash 2 times daily x10 days.

## 2019-11-01 LAB — LIPID PANEL
Chol/HDL Ratio: 3.5 ratio (ref 0.0–5.0)
Cholesterol, Total: 133 mg/dL (ref 100–199)
HDL: 38 mg/dL — ABNORMAL LOW (ref >39)
LDL Chol Calc (NIH): 60 mg/dL (ref 0–99)
Triglycerides: 218 mg/dL — ABNORMAL HIGH (ref 0–149)
VLDL Cholesterol Cal: 35 mg/dL (ref 5–40)

## 2019-11-01 LAB — CMP14+EGFR
ALT: 18 IU/L (ref 0–44)
AST: 11 IU/L (ref 0–40)
Albumin/Globulin Ratio: 1.4 (ref 1.2–2.2)
Albumin: 4.1 g/dL (ref 4.0–5.0)
Alkaline Phosphatase: 34 IU/L — ABNORMAL LOW (ref 48–121)
BUN/Creatinine Ratio: 12 (ref 9–20)
BUN: 20 mg/dL (ref 6–24)
Bilirubin Total: 0.2 mg/dL (ref 0.0–1.2)
CO2: 22 mmol/L (ref 20–29)
Calcium: 9.1 mg/dL (ref 8.7–10.2)
Chloride: 101 mmol/L (ref 96–106)
Creatinine, Ser: 1.72 mg/dL — ABNORMAL HIGH (ref 0.76–1.27)
GFR calc Af Amer: 54 mL/min/{1.73_m2} — ABNORMAL LOW (ref >59)
GFR calc non Af Amer: 46 mL/min/{1.73_m2} — ABNORMAL LOW (ref >59)
Globulin, Total: 2.9 g/dL (ref 1.5–4.5)
Glucose: 195 mg/dL — ABNORMAL HIGH (ref 65–99)
Potassium: 5.1 mmol/L (ref 3.5–5.2)
Sodium: 138 mmol/L (ref 134–144)
Total Protein: 7 g/dL (ref 6.0–8.5)

## 2019-11-01 LAB — MICROALBUMIN / CREATININE URINE RATIO
Creatinine, Urine: 83.8 mg/dL
Microalb/Creat Ratio: 103 mg/g creat — ABNORMAL HIGH (ref 0–29)
Microalbumin, Urine: 86 ug/mL

## 2019-11-01 LAB — PSA: Prostate Specific Ag, Serum: 2.1 ng/mL (ref 0.0–4.0)

## 2019-11-03 ENCOUNTER — Other Ambulatory Visit: Payer: Self-pay | Admitting: *Deleted

## 2019-11-03 LAB — URINE CULTURE

## 2019-11-03 LAB — SPECIMEN STATUS REPORT

## 2019-11-03 MED ORDER — PRAVASTATIN SODIUM 10 MG PO TABS: 10.0000 mg | ORAL_TABLET | Freq: Every day | ORAL | 2 refills | Status: DC

## 2019-11-14 ENCOUNTER — Ambulatory Visit (INDEPENDENT_AMBULATORY_CARE_PROVIDER_SITE_OTHER): Payer: Medicare Other | Admitting: Pharmacist

## 2019-11-14 ENCOUNTER — Other Ambulatory Visit: Payer: Self-pay

## 2019-11-14 DIAGNOSIS — N183 Chronic kidney disease, stage 3 unspecified: Secondary | ICD-10-CM

## 2019-11-14 DIAGNOSIS — E1122 Type 2 diabetes mellitus with diabetic chronic kidney disease: Secondary | ICD-10-CM | POA: Diagnosis not present

## 2019-11-14 NOTE — Progress Notes (Signed)
     11/14/2019 Name: Michael Ramos MRN: [id number] DOB: 03/10/1971   S:  87 yoM presents for diabetes evaluation, education, and management Patient was referred and last seen by Primary Care Provider on 10/31/19.  Patient is accompanied by his sister who is very involved in his care.  Insurance coverage/medication affordability: dual medicare/caid  Patient reports adherence with medications. . Current diabetes medications include: tradjenta, metformin, jardiance, glipizide . Current hypertension medications include: coreg, entresto Goal 130/80 . Current hyperlipidemia medications include: pravastatin  Patient denies hypoglycemic events.   Patient reported dietary habits: Eats 2-3 meals/day The patient is asked to make an attempt to improve diet and exercise patterns to aid in medical management of this problem. Discussed meal planning options and Plate method for healthy eating . Avoid sugary drinks and desserts . Incorporate balanced protein, non starchy veggies, 1 serving of carbohydrate with each meal . Increase water intake . Increase physical activity as able  . Snacks: eating late night snacks on occasion   Patient-reported exercise habits: n/a    O:  Lab Results  Component Value Date   HGBA1C 7.5 (H) 10/31/2019    Lipid Panel     Component Value Date/Time   CHOL 133 10/31/2019 1046   TRIG 218 (H) 10/31/2019 1046   HDL 38 (L) 10/31/2019 1046   CHOLHDL 3.5 10/31/2019 1046   LDLCALC 60 10/31/2019 1046    Home fasting blood sugars: 130, 129, 164, 165, 163 117 today   2 hour post-meal/random blood sugars: n/a.   A/P:  Diabetes T2DM, A1c currently 7.5% (increasing). Patient is adherent with medication. Control is suboptimal due to late night snacking.  -Continue current regimen until next PCP follow up; BG improving  Continue jardiance, tradjenta, metformin and glipizide for now  Sister would not like to overwhelm regimen or make changes  Consider  combining SGLT2, DPP4 and metformin in to Trijardy or another combo pill to simplify regimen  -Extensively discussed pathophysiology of diabetes, recommended lifestyle interventions, dietary effects on blood sugar control  -Counseled on s/sx of and management of hypoglycemia  -Next A1C anticipated 01/2020  Written patient instructions provided.  Total time in face to face counseling 30 minutes.   Follow up Pharmacist Clinic Visit ON 12/16/19.    Regina Eck, PharmD, BCPS Clinical Pharmacist, [place]  II Phone [phone]

## 2019-11-17 ENCOUNTER — Other Ambulatory Visit: Payer: Self-pay | Admitting: Family Medicine

## 2019-11-17 DIAGNOSIS — E119 Type 2 diabetes mellitus without complications: Secondary | ICD-10-CM

## 2019-11-24 ENCOUNTER — Encounter: Payer: Self-pay | Admitting: Pharmacist

## 2019-11-28 ENCOUNTER — Other Ambulatory Visit: Payer: Self-pay | Admitting: Nurse Practitioner

## 2019-11-28 ENCOUNTER — Other Ambulatory Visit: Payer: Self-pay | Admitting: Family Medicine

## 2019-12-16 ENCOUNTER — Ambulatory Visit (INDEPENDENT_AMBULATORY_CARE_PROVIDER_SITE_OTHER): Payer: Medicare Other | Admitting: Pharmacist

## 2019-12-16 ENCOUNTER — Telehealth: Payer: Self-pay | Admitting: Family Medicine

## 2019-12-16 ENCOUNTER — Other Ambulatory Visit: Payer: Self-pay

## 2019-12-16 DIAGNOSIS — E119 Type 2 diabetes mellitus without complications: Secondary | ICD-10-CM | POA: Diagnosis not present

## 2019-12-16 NOTE — Progress Notes (Signed)
    12/16/2019 Name: Michael Ramos MRN: [id number] DOB: 1970-06-24   S:  57 yoM presents for diabetes evaluation, education, and management Patient was referred and last seen by Primary Care Provider on 10/31/19.  Patient is accompanied by his sister who is very involved in his care.  Insurance coverage/medication affordability: dual medicare/caid  Patient reports adherence with medications.  Current diabetes medications include: tradjenta, metformin, jardiance, glipizide  Current hypertension medications include: coreg, entresto Goal 130/80  Current hyperlipidemia medications include: pravastatin  Patient denies hypoglycemic events.   Patient reported dietary habits: Eats 2-3 meals/day The patient is asked to make an attempt to improve diet and exercise patterns to aid in medical management of this problem. Discussed meal planning options and Plate method for healthy eating  Avoid sugary drinks and desserts  Incorporate balanced protein, non starchy veggies, 1 serving of carbohydrate with each meal  Increase water intake  Increase physical activity as able   Snacks: eating late night snacks on occasion   Patient enjoys drinking water and dr. Malachi Bonds zero  He eats a banana for breakfast  Hamburger (no bun) for dinner  Does enjoy m[employer] for lunch, encouraged smaller fry portion  Patient-reported exercise habits: n/a O:  Lab Results  Component Value Date   HGBA1C 7.5 (H) 10/31/2019     Lipid Panel     Component Value Date/Time   CHOL 133 10/31/2019 1046   TRIG 218 (H) 10/31/2019 1046   HDL 38 (L) 10/31/2019 1046   CHOLHDL 3.5 10/31/2019 1046   LDLCALC 60 10/31/2019 1046    Home fasting blood sugars: 130-140 (did not bring in log today, will bring in next month)  2 hour post-meal/random blood sugars: 191   A/P:  Diabetes T2DM, A1c currently 7.5% (increasing). Patient is adherent with medication. Control is suboptimal due to late night  snacking.  -Continue current regimen until next PCP follow up; post-prandial 191 today after banana, usually 130-140s per report             -Continue jardiance, tradjenta, metformin and glipizide for now             Sister would not like to overwhelm regimen or make changes until necessary              -Consider combining SGLT2, DPP4 and metformin in to Trijardy or another combo pill to simplify regimen  -Cannot titrate metformin due to GI issues  -Extensively discussed pathophysiology of diabetes, recommended lifestyle interventions, dietary effects on blood sugar control  -Counseled on s/sx of and management of hypoglycemia  -Next A1C anticipated 02/09/20.   Written patient instructions provided.  Total time in face to face counseling 28 minutes.   Follow up with PharmD on 01/26/20.  Follow up PCP Clinic Visit ON 02/09/20.    Regina Eck, PharmD, BCPS Clinical Pharmacist, [place]  II Phone [phone]

## 2020-01-03 ENCOUNTER — Other Ambulatory Visit: Payer: Self-pay | Admitting: Family Medicine

## 2020-01-19 ENCOUNTER — Other Ambulatory Visit: Payer: Self-pay | Admitting: Cardiology

## 2020-01-19 ENCOUNTER — Other Ambulatory Visit: Payer: Self-pay | Admitting: Family Medicine

## 2020-01-20 ENCOUNTER — Other Ambulatory Visit: Payer: Self-pay | Admitting: *Deleted

## 2020-01-20 DIAGNOSIS — J3089 Other allergic rhinitis: Secondary | ICD-10-CM

## 2020-01-20 MED ORDER — LORATADINE 10 MG PO TABS: ORAL_TABLET | ORAL | 3 refills | Status: DC

## 2020-01-26 ENCOUNTER — Ambulatory Visit: Payer: Self-pay | Admitting: Pharmacist

## 2020-02-09 ENCOUNTER — Ambulatory Visit (INDEPENDENT_AMBULATORY_CARE_PROVIDER_SITE_OTHER): Payer: Medicare Other | Admitting: Family Medicine

## 2020-02-09 ENCOUNTER — Other Ambulatory Visit: Payer: Self-pay

## 2020-02-09 ENCOUNTER — Encounter: Payer: Self-pay | Admitting: Family Medicine

## 2020-02-09 VITALS — BP 97/62 | HR 73 | Temp 96.9°F | Ht 75.0 in | Wt 276.0 lb

## 2020-02-09 DIAGNOSIS — I152 Hypertension secondary to endocrine disorders: Secondary | ICD-10-CM

## 2020-02-09 DIAGNOSIS — E1169 Type 2 diabetes mellitus with other specified complication: Secondary | ICD-10-CM

## 2020-02-09 DIAGNOSIS — E119 Type 2 diabetes mellitus without complications: Secondary | ICD-10-CM | POA: Diagnosis not present

## 2020-02-09 DIAGNOSIS — E785 Hyperlipidemia, unspecified: Secondary | ICD-10-CM

## 2020-02-09 DIAGNOSIS — Z23 Encounter for immunization: Secondary | ICD-10-CM | POA: Diagnosis not present

## 2020-02-09 DIAGNOSIS — E1159 Type 2 diabetes mellitus with other circulatory complications: Secondary | ICD-10-CM

## 2020-02-09 LAB — BAYER DCA HB A1C WAIVED: HB A1C (BAYER DCA - WAIVED): 7.3 % — ABNORMAL HIGH (ref <7.0)

## 2020-02-09 NOTE — Progress Notes (Signed)
Subjective: CC: f/u DM, CHF PCP: Janora Norlander, DO JJO:ACZYSAY L Crull is a 49 y.o. male presenting to clinic today for:  1. Type 2 Diabetes w/ HTN, HLD. History: followed by Dr Harl Bowie. Cannot tolerate higher doses of metformin.  Patient reports compliance with Jardiance 25 mg daily, Trajenta $RemoveBeforeD'5mg'SIyeilpDCzTHAI$ , glipizide 5 mg twice daily, metformin 500 mg daily, Coreg 25 mg twice daily, Entresto twice daily, Pravachol $RemoveBeforeDE'10mg'uelegahqSyGzAbi$  (added recently).  They had no issues with affordability of medications.  They are still not really wanting to combine the medications as patient is doing well with his meds.  His mother organizes them for him each day.  No hypoglycemic episodes.  This morning's fasting blood sugar was 114.  He continues to have quite a bit of urine output but also drinks a lot of fluids.  No reports of chest pain, shortness of breath  Last eye exam: UTD Last foot exam: UTD Last A1c:  Lab Results  Component Value Date   HGBA1C 7.5 (H) 10/31/2019   Nephropathy screen indicated?: on ARB with entresto Last flu, zoster and/or pneumovax:  Immunization History  Administered Date(s) Administered   Influenza,inj,Quad PF,6+ Mos 01/18/2016, 01/10/2017, 01/31/2018   PFIZER SARS-COV-2 Vaccination 07/17/2019, 08/08/2019   Pneumococcal Polysaccharide-23 01/10/2017    ROS: Per HPI  No Known Allergies Past Medical History:  Diagnosis Date   Allergy    Autism    CHF (congestive heart failure) ([place])    a. EF 20-25% by echo in 08/2016 b. repeat echo in 02/2017 showing EF 30-35% with cath showing normal cors.    Diabetes mellitus without complication (HCC)    Obesity (BMI 30-39.9) 10/23/2017    Current Outpatient Medications:    potassium chloride SA (KLOR-CON) 20 MEQ tablet, TAKE TWO TABLETS BY MOUTH DAILY, Disp: 180 tablet, Rfl: 2   carvedilol (COREG) 25 MG tablet, TAKE ONE TABLET BY MOUTH TWICE DAILY, Disp: 180 tablet, Rfl: 3   diclofenac Sodium (VOLTAREN) 1 % GEL, Voltaren 1 %  topical gel, Disp: , Rfl:    ENTRESTO 49-51 MG, TAKE ONE TABLET BY MOUTH TWICE DAILY, Disp: 60 tablet, Rfl: 6   furosemide (LASIX) 20 MG tablet, TAKE ONE (1) TABLET EACH DAY, Disp: 90 tablet, Rfl: 3   glipiZIDE (GLUCOTROL) 5 MG tablet, TAKE 1 TABLET TWICE DAILY BEFORE MEALS, Disp: 180 tablet, Rfl: 0   JARDIANCE 25 MG TABS tablet, TAKE ONE TABLET EACH MORNING BEFORE BREAKFAST, Disp: 30 tablet, Rfl: 0   linagliptin (TRADJENTA) 5 MG TABS tablet, Take 1 tablet (5 mg total) by mouth daily., Disp: 14 tablet, Rfl: 0   loratadine (CLARITIN) 10 MG tablet, TAKE ONE (1) TABLET EACH DAY, Disp: 90 tablet, Rfl: 3   metFORMIN (GLUCOPHAGE) 500 MG tablet, TAKE 1 TABLET EVERY MORNING WITH BREAKFAST, Disp: 90 tablet, Rfl: 0   pantoprazole (PROTONIX) 40 MG tablet, TAKE ONE (1) TABLET EACH DAY, Disp: 90 tablet, Rfl: 3   pravastatin (PRAVACHOL) 10 MG tablet, TAKE ONE (1) TABLET EACH DAY, Disp: 30 tablet, Rfl: 2 Social History   Socioeconomic History   Marital status: Single    Spouse name: Not on file   Number of children: Not on file   Years of education: Not on file   Highest education level: 12th grade  Occupational History   Occupation: Disabled  Tobacco Use   Smoking status: Never Smoker   Smokeless tobacco: Never Used  Scientific laboratory technician Use: Never used  Substance and Sexual Activity   Alcohol use: No  Drug use: No   Sexual activity: Not Currently  Other Topics Concern   Not on file  Social History Narrative   Autistic   Lives with Mother   Social Determinants of Health   Financial Resource Strain:    Difficulty of Paying Living Expenses: Not on file  Food Insecurity:    Worried About [place] in the Last Year: Not on file   [employer] of Food in the Last Year: Not on file  Transportation Needs:    Lack of Transportation (Medical): Not on file   Lack of Transportation (Non-Medical): Not on file  Physical Activity:    Days of Exercise per Week: Not on  file   Minutes of Exercise per Session: Not on file  Stress:    Feeling of Stress : Not on file  Social Connections:    Frequency of Communication with Friends and Family: Not on file   Frequency of Social Gatherings with Friends and Family: Not on file   Attends Religious Services: Not on file   Active Member of Clubs or Organizations: Not on file   Attends Archivist Meetings: Not on file   Marital Status: Not on file  Intimate Partner Violence:    Fear of Current or Ex-Partner: Not on file   Emotionally Abused: Not on file   Physically Abused: Not on file   Sexually Abused: Not on file   Family History  Problem Relation Age of Onset   Diabetes Mother    Diabetes Father    Bladder Cancer Father    Colon cancer Neg Hx    Esophageal cancer Neg Hx    Liver cancer Neg Hx    Stomach cancer Neg Hx     Objective: Office vital signs reviewed. BP 97/62    Pulse 73    Temp (!) 96.9 F (36.1 C) (Temporal)    Ht $R'6\' 3"'sv$  (1.905 m)    Wt 276 lb (125.2 kg)    SpO2 92%    BMI 34.50 kg/m   Physical Examination:  General: Awake, alert, well nourished, No acute distress HEENT: Normal, sclera white, MMM  Cardio: regular rate and rhythm, S1S2 heard, no murmurs appreciated Pulm: clear to auscultation bilaterally, no wheezes, rhonchi or rales; normal work of breathing on room air  Assessment/ Plan: 49 y.o. male   1. Type 2 diabetes mellitus with other specified complication, without long-term current use of insulin (HCC) Improving but not at goal yet.  I am not going to make any medication adjustments given cognitive impairment.  I worry that he would not be able to relate hypoglycemic episode.  For now we will continue current regimen.  Again, I offered combining his medication to simplify regimen but they would like to hold off on this for now.  Check renal function, liver function - Bayer DCA Hb A1c Waived - CMP14+EGFR  2. Hyperlipidemia associated with type 2  diabetes mellitus ([place]) Continue statin for now.  Check lipid panel and LFTs - CMP14+EGFR - Lipid Panel  3. Hypertension associated with diabetes ([place]) Borderline low.  I will CC his cardiologist.  I recommended that they follow-up on his blood pressures.  If persistently low, may need to consider backing off on some of his medicines. - CMP14+EGFR   Orders Placed This Encounter  Procedures   Bayer DCA Hb A1c Waived   No orders of the defined types were placed in this encounter.    Janora Norlander, DO [facility] °[phone] ° ° °

## 2020-02-09 NOTE — Addendum Note (Signed)
Addended byCarrolyn Leigh on: 02/09/2020 11:43 AM   Modules accepted: Orders

## 2020-02-09 NOTE — Patient Instructions (Signed)
You had labs performed today.  You will be contacted with the results of the labs once they are available, usually in the next 3 business days for routine lab work.  If you have an active my chart account, they will be released to your MyChart.  If you prefer to have these labs released to you via telephone, please let us know.  If you had a pap smear or biopsy performed, expect to be contacted in about 7-10 days.  Discussed watching carbohydrate intake.  The more carbs and sugar that he intakes, the more likely he will have to urinate. No changes to medications today.  A1c is slightly above goal but I think that with some diet modification he can get there.  Please keep an eye on his blood pressure.  If remains on the low side, may need to consider cutting back on one of his medications.  Will await for Dr. Nelly Laurence input on this

## 2020-02-10 LAB — CMP14+EGFR
ALT: 15 IU/L (ref 0–44)
AST: 14 IU/L (ref 0–40)
Albumin/Globulin Ratio: 1.4 (ref 1.2–2.2)
Albumin: 4 g/dL (ref 4.0–5.0)
Alkaline Phosphatase: 30 IU/L — ABNORMAL LOW (ref 44–121)
BUN/Creatinine Ratio: 11 (ref 9–20)
BUN: 19 mg/dL (ref 6–24)
Bilirubin Total: 0.4 mg/dL (ref 0.0–1.2)
CO2: 23 mmol/L (ref 20–29)
Calcium: 9.3 mg/dL (ref 8.7–10.2)
Chloride: 101 mmol/L (ref 96–106)
Creatinine, Ser: 1.75 mg/dL — ABNORMAL HIGH (ref 0.76–1.27)
GFR calc Af Amer: 52 mL/min/{1.73_m2} — ABNORMAL LOW (ref >59)
GFR calc non Af Amer: 45 mL/min/{1.73_m2} — ABNORMAL LOW (ref >59)
Globulin, Total: 2.8 g/dL (ref 1.5–4.5)
Glucose: 140 mg/dL — ABNORMAL HIGH (ref 65–99)
Potassium: 5.2 mmol/L (ref 3.5–5.2)
Sodium: 139 mmol/L (ref 134–144)
Total Protein: 6.8 g/dL (ref 6.0–8.5)

## 2020-02-10 LAB — LIPID PANEL
Chol/HDL Ratio: 3.1 ratio (ref 0.0–5.0)
Cholesterol, Total: 113 mg/dL (ref 100–199)
HDL: 36 mg/dL — ABNORMAL LOW (ref >39)
LDL Chol Calc (NIH): 45 mg/dL (ref 0–99)
Triglycerides: 197 mg/dL — ABNORMAL HIGH (ref 0–149)
VLDL Cholesterol Cal: 32 mg/dL (ref 5–40)

## 2020-02-11 ENCOUNTER — Telehealth: Payer: Self-pay

## 2020-02-11 NOTE — Telephone Encounter (Signed)
Mom returned missed call regarding pt's lab results. Reviewed results with mom per Dr Alver Sorrow note. Mom voiced understanding.

## 2020-02-12 ENCOUNTER — Other Ambulatory Visit: Payer: Self-pay | Admitting: Family Medicine

## 2020-02-12 ENCOUNTER — Other Ambulatory Visit: Payer: Self-pay

## 2020-02-12 DIAGNOSIS — E119 Type 2 diabetes mellitus without complications: Secondary | ICD-10-CM

## 2020-02-17 ENCOUNTER — Other Ambulatory Visit: Payer: Self-pay | Admitting: Family Medicine

## 2020-02-27 DIAGNOSIS — Z23 Encounter for immunization: Secondary | ICD-10-CM | POA: Diagnosis not present

## 2020-03-17 ENCOUNTER — Other Ambulatory Visit: Payer: Self-pay | Admitting: Family Medicine

## 2020-03-31 DIAGNOSIS — X32XXXD Exposure to sunlight, subsequent encounter: Secondary | ICD-10-CM | POA: Diagnosis not present

## 2020-03-31 DIAGNOSIS — L570 Actinic keratosis: Secondary | ICD-10-CM | POA: Diagnosis not present

## 2020-03-31 DIAGNOSIS — D225 Melanocytic nevi of trunk: Secondary | ICD-10-CM | POA: Diagnosis not present

## 2020-03-31 DIAGNOSIS — Z1283 Encounter for screening for malignant neoplasm of skin: Secondary | ICD-10-CM | POA: Diagnosis not present

## 2020-04-01 ENCOUNTER — Ambulatory Visit (INDEPENDENT_AMBULATORY_CARE_PROVIDER_SITE_OTHER): Payer: Medicare Other | Admitting: Cardiology

## 2020-04-01 ENCOUNTER — Encounter: Payer: Self-pay | Admitting: Cardiology

## 2020-04-01 VITALS — BP 102/70 | HR 70 | Ht 75.0 in | Wt 279.0 lb

## 2020-04-01 DIAGNOSIS — I5022 Chronic systolic (congestive) heart failure: Secondary | ICD-10-CM

## 2020-04-01 MED ORDER — FUROSEMIDE 20 MG PO TABS
20.0000 mg | ORAL_TABLET | Freq: Every day | ORAL | 1 refills | Status: DC | PRN
Start: 2020-04-01 — End: 2020-10-01

## 2020-04-01 NOTE — Patient Instructions (Addendum)
Your physician recommends that you schedule a follow-up appointment in: [place] has recommended you make the following change in your medication:   CHANGE LASIX 20 MG AS NEEDED FOR SWELLING  Thank you for choosing [facility]!!

## 2020-04-01 NOTE — Progress Notes (Signed)
Clinical Summary Mr. Burgers is a 49 y.o.male seen today for follow up of the following medical problems.   1. Chronic systolic HF/NICM - new diagnosis during 08/2016 admission with fluid overload - 02/2017 cath no significant CAD.  07/2017 echo LVEF 35-40%, grade II diastolic dysfunction   99991111 echo: LVEF 50-55%, no WMAs -soft bp's at recent pcp visit, 97/62 - no recent SOB/DOE, no LE edema.    2. Nocturnal hypoxia/Possible sleep apnea - followed by Dr Radford Pax - drop in O2 sats noted during home oximetry. There were discussions about a formal sleep study however family does not feel patient could cooperate with study due to his auticsion   3. Autism  4. CKD - last Cr stable around 1.7, though overall uptrend from last year.   SH: Has 7 brothers and sisters. Has completed covid vaccine x 3, pfizer.    Past Medical History:  Diagnosis Date  . Allergy   . Autism   . CHF (congestive heart failure) ([place])    a. EF 20-25% by echo in 08/2016 b. repeat echo in 02/2017 showing EF 30-35% with cath showing normal cors.   . Diabetes mellitus without complication ([place])   . Obesity (BMI 30-39.9) 10/23/2017     No Known Allergies   Current Outpatient Medications  Medication Sig Dispense Refill  . potassium chloride SA (KLOR-CON) 20 MEQ tablet TAKE TWO TABLETS BY MOUTH DAILY 180 tablet 2  . carvedilol (COREG) 25 MG tablet TAKE ONE TABLET BY MOUTH TWICE DAILY 180 tablet 3  . diclofenac Sodium (VOLTAREN) 1 % GEL Voltaren 1 % topical gel    . ENTRESTO 49-51 MG TAKE ONE TABLET BY MOUTH TWICE DAILY 60 tablet 6  . furosemide (LASIX) 20 MG tablet TAKE ONE (1) TABLET EACH DAY 90 tablet 3  . glipiZIDE (GLUCOTROL) 5 MG tablet TAKE 1 TABLET TWICE DAILY BEFORE MEALS 180 tablet 0  . JARDIANCE 25 MG TABS tablet TAKE ONE TABLET EACH MORNING BEFORE BREAKFAST 30 tablet 2  . linagliptin (TRADJENTA) 5 MG TABS tablet Take 1 tablet (5 mg total) by mouth daily. 14 tablet 0  .  loratadine (CLARITIN) 10 MG tablet TAKE ONE (1) TABLET EACH DAY 90 tablet 3  . metFORMIN (GLUCOPHAGE) 500 MG tablet TAKE 1 TABLET EVERY MORNING WITH BREAKFAST 90 tablet 0  . pantoprazole (PROTONIX) 40 MG tablet TAKE ONE (1) TABLET EACH DAY 90 tablet 3  . pravastatin (PRAVACHOL) 10 MG tablet TAKE ONE (1) TABLET EACH DAY 30 tablet 1   No current facility-administered medications for this visit.     Past Surgical History:  Procedure Laterality Date  . RIGHT/LEFT HEART CATH AND CORONARY ANGIOGRAPHY N/A 03/06/2017   Procedure: RIGHT/LEFT HEART CATH AND CORONARY ANGIOGRAPHY;  Surgeon: Burnell Blanks, MD;  Location: [facility];  Service: Cardiovascular;  Laterality: N/A;     No Known Allergies    Family History  Problem Relation Age of Onset  . Diabetes Mother   . Diabetes Father   . Bladder Cancer Father   . Colon cancer Neg Hx   . Esophageal cancer Neg Hx   . Liver cancer Neg Hx   . Stomach cancer Neg Hx      Social History Mr. Currin reports that he has never smoked. He has never used smokeless tobacco. Mr. Arons reports no history of alcohol use.   Review of Systems CONSTITUTIONAL: No weight loss, fever, chills, weakness or fatigue.  HEENT: Eyes: No visual loss, blurred vision,  double vision or yellow sclerae.No hearing loss, sneezing, congestion, runny nose or sore throat.  SKIN: No rash or itching.  CARDIOVASCULAR: per hpi RESPIRATORY: No shortness of breath, cough or sputum.  GASTROINTESTINAL: No anorexia, nausea, vomiting or diarrhea. No abdominal pain or blood.  GENITOURINARY: No burning on urination, no polyuria NEUROLOGICAL: No headache, dizziness, syncope, paralysis, ataxia, numbness or tingling in the extremities. No change in bowel or bladder control.  MUSCULOSKELETAL: No muscle, back pain, joint pain or stiffness.  LYMPHATICS: No enlarged nodes. No history of splenectomy.  PSYCHIATRIC: No history of depression or anxiety.  ENDOCRINOLOGIC: No  reports of sweating, cold or heat intolerance. No polyuria or polydipsia.  Marland Kitchen   Physical Examination Today's Vitals   04/01/20 1058  BP: 102/70  Pulse: 70  SpO2: 96%  Weight: 279 lb (126.6 kg)  Height: 6\' 3"  (1.905 m)   Body mass index is 34.87 kg/m.  Gen: resting comfortably, no acute distress HEENT: no scleral icterus, pupils equal round and reactive, no palptable cervical adenopathy,  CV: RRR, no m/r/g, no jvd Resp: Clear to auscultation bilaterally GI: abdomen is soft, non-tender, non-distended, normal bowel sounds, no hepatosplenomegaly MSK: extremities are warm, no edema.  Skin: warm, no rash Neuro:  no focal deficits Psych: appropriate affect   Diagnostic Studies  02/2017 cath  Hemodynamic findings consistent with mild pulmonary hypertension.  1. No angiographic evidence of CAD 2. Non-ischemic cardiomyopathy  Recommendations: medical management of cardiomyopathy and CHF.   02/2017 echo Study Conclusions  - Left ventricle: The cavity size was mildly dilated. Wall thickness was normal. Systolic function was moderately to severely reduced. The estimated ejection fraction was in the range of 30% to 35%. Diffuse hypokinesis. Diastolic dysfunction, grade indeterminate. Indeterminate filling pressures. - Mitral valve: There was mild regurgitation.   07/2017 echo Study Conclusions  - Left ventricle: The cavity size was mildly dilated. Wall thickness was normal. Systolic function was moderately reduced. The estimated ejection fraction was in the range of 35% to 40%. Diffuse hypokinesis. Features are consistent with a pseudonormal left ventricular filling pattern, with concomitant abnormal relaxation and increased filling pressure (grade 2 diastolic dysfunction). - Mitral valve: There was mild regurgitation.   09/2019 echo 1. Left ventricular ejection fraction, by estimation, is 50 to 55%. The  left ventricle has normal function.  The left ventricle has no regional  wall motion abnormalities. Left ventricular diastolic parameters were  normal.  2. Right ventricular systolic function is normal. The right ventricular  size is normal. There is normal pulmonary artery systolic pressure.  3. The mitral valve is normal in structure. No evidence of mitral valve  regurgitation. No evidence of mitral stenosis.  4. The aortic valve was not well visualized. Aortic valve regurgitation  is not visualized. No aortic stenosis is present.  5. The inferior vena cava is normal in size with greater than 50%  respiratory variability, suggesting right atrial pressure of 3 mmHg.    Assessment and Plan  1. Chronic systolic HF/ NICM -medical therapy limited by his renal function and soft bp's - by most recent echo LVEF has noramlzied - no symptoms - soft bp's but asymptomatic, continue curernt therapy. With elevated Cr will change his lasix to prn only. If renal dysfunction persists may decrease entresto at f/u    Arnoldo Lenis, M.D.

## 2020-04-17 ENCOUNTER — Other Ambulatory Visit: Payer: Self-pay | Admitting: Cardiology

## 2020-04-28 ENCOUNTER — Telehealth: Payer: Self-pay

## 2020-04-28 NOTE — Telephone Encounter (Signed)
Advised yes mucinex is okay to treat congestion.

## 2020-05-07 ENCOUNTER — Other Ambulatory Visit: Payer: Self-pay | Admitting: Family Medicine

## 2020-05-07 ENCOUNTER — Telehealth: Payer: Self-pay | Admitting: *Deleted

## 2020-05-07 ENCOUNTER — Ambulatory Visit (INDEPENDENT_AMBULATORY_CARE_PROVIDER_SITE_OTHER): Payer: Medicare Other | Admitting: Family Medicine

## 2020-05-07 ENCOUNTER — Encounter: Payer: Self-pay | Admitting: Family Medicine

## 2020-05-07 ENCOUNTER — Other Ambulatory Visit: Payer: Self-pay

## 2020-05-07 DIAGNOSIS — Z20822 Contact with and (suspected) exposure to covid-19: Secondary | ICD-10-CM

## 2020-05-07 DIAGNOSIS — R059 Cough, unspecified: Secondary | ICD-10-CM

## 2020-05-07 DIAGNOSIS — E119 Type 2 diabetes mellitus without complications: Secondary | ICD-10-CM

## 2020-05-07 LAB — VERITOR FLU A/B WAIVED
Influenza A: NEGATIVE
Influenza B: NEGATIVE

## 2020-05-07 MED ORDER — BENZONATATE 100 MG PO CAPS: 100.0000 mg | ORAL_CAPSULE | Freq: Three times a day (TID) | ORAL | 0 refills | Status: DC | PRN

## 2020-05-07 NOTE — Telephone Encounter (Signed)
Ok. Tell her to bring him on down for the PCR nasal swab.  I'll order flu too just in case

## 2020-05-07 NOTE — Telephone Encounter (Signed)
Mother aware

## 2020-05-07 NOTE — Telephone Encounter (Signed)
Patient's mother called and states that patient did rapid home covid test (Quickvue) and followed instructions correctly and results are negative

## 2020-05-07 NOTE — Progress Notes (Signed)
Telephone visit  Subjective: CC: URI? COVID PCP: Janora Norlander, DO Michael Ramos is a 50 y.o. male calls for telephone consult today. Patient provides verbal consent for consult held via phone.  Due to COVID-19 pandemic this visit was conducted virtually. This visit type was conducted due to national recommendations for restrictions regarding the COVID-19 Pandemic (e.g. social distancing, sheltering in place) in an effort to limit this patient's exposure and mitigate transmission in our community. All issues noted in this document were discussed and addressed.  A physical exam was not performed with this format.   Location of patient: home Location of provider: [facility] Others present for call: mom  1. ?COVID Onset of cough, congestion a couple days ago.  No fever, shortness of breath, diarrhea, vomiting. Has Mucinex DM for symptoms. Positive exposure from mother.  She would test him on a rapid test today.   ROS: Per HPI  No Known Allergies Past Medical History:  Diagnosis Date  . Allergy   . Autism   . CHF (congestive heart failure) ([place])    a. EF 20-25% by echo in 08/2016 b. repeat echo in 02/2017 showing EF 30-35% with cath showing normal cors.   . Diabetes mellitus without complication ([place])   . Obesity (BMI 30-39.9) 10/23/2017    Current Outpatient Medications:  .  carvedilol (COREG) 25 MG tablet, TAKE ONE TABLET BY MOUTH TWICE DAILY, Disp: 180 tablet, Rfl: 3 .  diclofenac Sodium (VOLTAREN) 1 % GEL, Voltaren 1 % topical gel, Disp: , Rfl:  .  ENTRESTO 49-51 MG, TAKE ONE TABLET BY MOUTH TWICE DAILY, Disp: 60 tablet, Rfl: 6 .  furosemide (LASIX) 20 MG tablet, Take 1 tablet (20 mg total) by mouth daily as needed., Disp: 90 tablet, Rfl: 1 .  glipiZIDE (GLUCOTROL) 5 MG tablet, TAKE 1 TABLET TWICE DAILY BEFORE MEALS, Disp: 180 tablet, Rfl: 0 .  JARDIANCE 25 MG TABS tablet, TAKE ONE TABLET EACH MORNING BEFORE BREAKFAST, Disp: 30 tablet, Rfl: 2 .  linagliptin (TRADJENTA) 5 MG  TABS tablet, Take 1 tablet (5 mg total) by mouth daily., Disp: 14 tablet, Rfl: 0 .  loratadine (CLARITIN) 10 MG tablet, TAKE ONE (1) TABLET EACH DAY, Disp: 90 tablet, Rfl: 3 .  metFORMIN (GLUCOPHAGE) 500 MG tablet, TAKE 1 TABLET EVERY MORNING WITH BREAKFAST, Disp: 90 tablet, Rfl: 0 .  pantoprazole (PROTONIX) 40 MG tablet, TAKE ONE (1) TABLET EACH DAY, Disp: 90 tablet, Rfl: 3 .  potassium chloride SA (KLOR-CON) 20 MEQ tablet, TAKE TWO TABLETS BY MOUTH DAILY, Disp: 180 tablet, Rfl: 2 .  pravastatin (PRAVACHOL) 10 MG tablet, TAKE ONE (1) TABLET EACH DAY, Disp: 30 tablet, Rfl: 1  Assessment/ Plan: 50 y.o. male   Contact with and (suspected) exposure to covid-19 - Plan: Novel Coronavirus, NAA ([facility]), Veritor Flu A/B Waived  Cough in adult patient - Plan: Novel Coronavirus, NAA ([facility]), Veritor Flu A/B Waived  Suspect that this is COVID-19 virus but he rapidly tested negative at home.  PCR ordered.  We will also collect influenza given abrupt onset.  Continue supportive care.  If Covid positive, low threshold to start antiviral given his type 2 diabetes.  TEssalon perles for cough. Stay hydrated.  Start time: 10:05am  End time: 10:12am   Total time spent on patient care (including telephone call/ virtual visit): 7 minutes  [place], [place] [phone]

## 2020-05-08 LAB — SARS-COV-2, NAA 2 DAY TAT

## 2020-05-08 LAB — NOVEL CORONAVIRUS, NAA: SARS-CoV-2, NAA: NOT DETECTED

## 2020-05-10 ENCOUNTER — Other Ambulatory Visit: Payer: Self-pay | Admitting: Family Medicine

## 2020-05-11 ENCOUNTER — Ambulatory Visit (INDEPENDENT_AMBULATORY_CARE_PROVIDER_SITE_OTHER): Payer: Medicare Other | Admitting: Family Medicine

## 2020-05-11 ENCOUNTER — Encounter: Payer: Self-pay | Admitting: Family Medicine

## 2020-05-11 ENCOUNTER — Other Ambulatory Visit: Payer: Self-pay

## 2020-05-11 VITALS — BP 115/70 | HR 78 | Temp 97.7°F | Ht 75.0 in | Wt 283.0 lb

## 2020-05-11 DIAGNOSIS — I152 Hypertension secondary to endocrine disorders: Secondary | ICD-10-CM

## 2020-05-11 DIAGNOSIS — J069 Acute upper respiratory infection, unspecified: Secondary | ICD-10-CM

## 2020-05-11 DIAGNOSIS — E785 Hyperlipidemia, unspecified: Secondary | ICD-10-CM

## 2020-05-11 DIAGNOSIS — N1831 Chronic kidney disease, stage 3a: Secondary | ICD-10-CM

## 2020-05-11 DIAGNOSIS — E1159 Type 2 diabetes mellitus with other circulatory complications: Secondary | ICD-10-CM

## 2020-05-11 DIAGNOSIS — E1122 Type 2 diabetes mellitus with diabetic chronic kidney disease: Secondary | ICD-10-CM

## 2020-05-11 DIAGNOSIS — E1169 Type 2 diabetes mellitus with other specified complication: Secondary | ICD-10-CM | POA: Diagnosis not present

## 2020-05-11 LAB — BAYER DCA HB A1C WAIVED: HB A1C (BAYER DCA - WAIVED): 7.8 % — ABNORMAL HIGH (ref <7.0)

## 2020-05-11 MED ORDER — BENZONATATE 200 MG PO CAPS: 200.0000 mg | ORAL_CAPSULE | Freq: Two times a day (BID) | ORAL | 0 refills | Status: DC | PRN

## 2020-05-11 MED ORDER — TRIJARDY XR 25-5-1000 MG PO TB24
1.0000 | ORAL_TABLET | Freq: Every day | ORAL | 12 refills | Status: DC
Start: 2020-05-11 — End: 2021-02-25

## 2020-05-11 MED ORDER — TRIJARDY XR 5-2.5-1000 MG PO TB24
1.0000 | ORAL_TABLET | Freq: Every day | ORAL | 12 refills | Status: DC
Start: 2020-05-11 — End: 2020-05-11

## 2020-05-11 NOTE — Patient Instructions (Signed)
Stop Tradjenta, Metformin and Jardiance. I have combined these 3 medications into 1 pill called Trijardy.  This is a 1 time per day pill He will continue glipizide twice daily  Sugar is too high today. A1c 7.8. Goal 7.0.

## 2020-05-11 NOTE — Progress Notes (Signed)
Subjective: CC: DM PCP: Janora Norlander, DO [id number] L Fearnow is a 50 y.o. male presenting to clinic today for:  1. Type 2 Diabetes with hypertension, hyperlipidemia, CKD3a:  Admits that blood sugars probably elevated due to noncompliance with the high he had over the holidays.  Additionally he is drinking [place] D on a regular basis as well as many diet sodas.  There is questionable compliance with Tradjenta.  This apparently has fallen off his medicine list.  He continues to take glipizide twice daily as prescribed, Metformin and Jardiance.  He does have polyuria.  No reports of change in vision  Last eye exam: Up-to-date Last foot exam: Up-to-date Last A1c:  Lab Results  Component Value Date   HGBA1C 7.3 (H) 02/09/2020   Nephropathy screen indicated?:  On Entresto Last flu, zoster and/or pneumovax:  Immunization History  Administered Date(s) Administered  . Influenza,inj,Quad PF,6+ Mos 01/18/2016, 01/10/2017, 01/31/2018, 02/09/2020  . PFIZER(Purple Top)SARS-COV-2 Vaccination 07/17/2019, 08/08/2019  . Pneumococcal Polysaccharide-23 01/10/2017  . Tdap 02/09/2020    2.  URI Patient has ongoing cough that is dry.  He was tested for both flu and COVID but both of these were negative.  He has been vaccinated against COVID.  No hemoptysis, fevers, change in breathing or wheezing.  He is tolerating p.o. intake without difficulty.  Tessalon Perles have not been especially effective but he is taking Mucinex DM with some improvement  ROS: Per HPI  No Known Allergies Past Medical History:  Diagnosis Date  . Allergy   . Autism   . CHF (congestive heart failure) ([place])    a. EF 20-25% by echo in 08/2016 b. repeat echo in 02/2017 showing EF 30-35% with cath showing normal cors.   . Diabetes mellitus without complication ([place])   . Obesity (BMI 30-39.9) 10/23/2017    Current Outpatient Medications:  .  benzonatate (TESSALON PERLES) 100 MG capsule, Take 1 capsule (100 mg total) by  mouth 3 (three) times daily as needed., Disp: 20 capsule, Rfl: 0 .  carvedilol (COREG) 25 MG tablet, TAKE ONE TABLET BY MOUTH TWICE DAILY, Disp: 180 tablet, Rfl: 3 .  diclofenac Sodium (VOLTAREN) 1 % GEL, Voltaren 1 % topical gel, Disp: , Rfl:  .  ENTRESTO 49-51 MG, TAKE ONE TABLET BY MOUTH TWICE DAILY, Disp: 60 tablet, Rfl: 6 .  furosemide (LASIX) 20 MG tablet, Take 1 tablet (20 mg total) by mouth daily as needed., Disp: 90 tablet, Rfl: 1 .  glipiZIDE (GLUCOTROL) 5 MG tablet, TAKE 1 TABLET TWICE DAILY BEFORE MEALS, Disp: 180 tablet, Rfl: 0 .  JARDIANCE 25 MG TABS tablet, TAKE ONE TABLET EACH MORNING BEFORE BREAKFAST, Disp: 30 tablet, Rfl: 2 .  linagliptin (TRADJENTA) 5 MG TABS tablet, Take 1 tablet (5 mg total) by mouth daily., Disp: 14 tablet, Rfl: 0 .  loratadine (CLARITIN) 10 MG tablet, TAKE ONE (1) TABLET EACH DAY, Disp: 90 tablet, Rfl: 3 .  metFORMIN (GLUCOPHAGE) 500 MG tablet, TAKE 1 TABLET EVERY MORNING WITH BREAKFAST, Disp: 90 tablet, Rfl: 0 .  pantoprazole (PROTONIX) 40 MG tablet, TAKE ONE (1) TABLET EACH DAY, Disp: 90 tablet, Rfl: 3 .  potassium chloride SA (KLOR-CON) 20 MEQ tablet, TAKE TWO TABLETS BY MOUTH DAILY, Disp: 180 tablet, Rfl: 2 .  pravastatin (PRAVACHOL) 10 MG tablet, TAKE ONE (1) TABLET EACH DAY, Disp: 30 tablet, Rfl: 0 Social History   Socioeconomic History  . Marital status: Single    Spouse name: Not on file  . Number of children:  Not on file  . Years of education: Not on file  . Highest education level: 12th grade  Occupational History  . Occupation: Disabled  Tobacco Use  . Smoking status: Never Smoker  . Smokeless tobacco: Never Used  Vaping Use  . Vaping Use: Never used  Substance and Sexual Activity  . Alcohol use: No  . Drug use: No  . Sexual activity: Not Currently  Other Topics Concern  . Not on file  Social History Narrative   Autistic   Lives with Mother   Social Determinants of Health   Financial Resource Strain: Not on file  Food  Insecurity: Not on file  Transportation Needs: Not on file  Physical Activity: Not on file  Stress: Not on file  Social Connections: Not on file  Intimate Partner Violence: Not on file   Family History  Problem Relation Age of Onset  . Diabetes Mother   . Diabetes Father   . Bladder Cancer Father   . Colon cancer Neg Hx   . Esophageal cancer Neg Hx   . Liver cancer Neg Hx   . Stomach cancer Neg Hx     Objective: Office vital signs reviewed. BP 115/70   Pulse 78   Temp 97.7 F (36.5 C) (Temporal)   Ht 6\' 3"  (1.905 m)   Wt 283 lb (128.4 kg)   SpO2 97%   BMI 35.37 kg/m   Physical Examination:  General: Awake, alert, well nourished, No acute distress HEENT: Normal; sclera white Cardio: regular rate and rhythm, S1S2 heard, no murmurs appreciated Pulm: clear to auscultation bilaterally, no wheezes, rhonchi or rales; normal work of breathing on room air; coughing spells are noted intermittently Extremities: warm, well perfused, No edema, cyanosis or clubbing; +2 pulses bilaterally  Assessment/ Plan: 50 y.o. male   Type 2 diabetes mellitus with stage 3a chronic kidney disease, without long-term current use of insulin (HCC) - Plan: Bayer DCA Hb A1c Waived, Renal Function Panel, CBC, VITAMIN D 25 Hydroxy (Vit-D Deficiency, Fractures), Empagliflozin-Linaglip-Metform (TRIJARDY XR) 25-08-998 MG TB24, DISCONTINUED: Empagliflozin-Linaglip-Metform (TRIJARDY XR) 5-2.08-998 MG TB24  Hyperlipidemia associated with type 2 diabetes mellitus ([place])  Hypertension associated with diabetes ([place])  Viral URI with cough - Plan: benzonatate (TESSALON) 200 MG capsule  His sugar has shown a rise with A1c of 7.8 today.  There is questionable compliance with the Tradjenta.  I have combined his Tradjenta, Jardiance and Metformin into a single pill to simplify regimen.  The 2.5 mg dose was inadvertently sent and his pharmacy was contacted to adjust the prescription.  Continue statin  Blood pressure  well controlled.  Continue current regimen  He has tested negative for both Covid and influenza.  There is no evidence of secondary bacterial infection on today's exam.  In fact his pulmonary exam was totally unremarkable.  I have recommended ongoing use of Mucinex DM, adequate hydration and I have increased the Tessalon dose to 200 mg twice daily.  Hopefully this will improve symptoms and we discussed red flag signs and symptoms warranting further evaluation.  Both his mother and sister voiced good understanding of the plan and will follow up as needed   No orders of the defined types were placed in this encounter.  No orders of the defined types were placed in this encounter.    Janora Norlander, DO [place] [phone]

## 2020-05-12 ENCOUNTER — Other Ambulatory Visit: Payer: Self-pay | Admitting: Family Medicine

## 2020-05-12 LAB — RENAL FUNCTION PANEL
Albumin: 3.7 g/dL — ABNORMAL LOW (ref 4.0–5.0)
BUN/Creatinine Ratio: 9 (ref 9–20)
BUN: 15 mg/dL (ref 6–24)
CO2: 22 mmol/L (ref 20–29)
Calcium: 8.5 mg/dL — ABNORMAL LOW (ref 8.7–10.2)
Chloride: 98 mmol/L (ref 96–106)
Creatinine, Ser: 1.67 mg/dL — ABNORMAL HIGH (ref 0.76–1.27)
GFR calc Af Amer: 55 mL/min/{1.73_m2} — ABNORMAL LOW (ref >59)
GFR calc non Af Amer: 48 mL/min/{1.73_m2} — ABNORMAL LOW (ref >59)
Glucose: 256 mg/dL — ABNORMAL HIGH (ref 65–99)
Phosphorus: 3.2 mg/dL (ref 2.8–4.1)
Potassium: 4.9 mmol/L (ref 3.5–5.2)
Sodium: 135 mmol/L (ref 134–144)

## 2020-05-12 LAB — CBC
Hematocrit: 47.7 % (ref 37.5–51.0)
Hemoglobin: 14.5 g/dL (ref 13.0–17.7)
MCH: 26.3 pg — ABNORMAL LOW (ref 26.6–33.0)
MCHC: 30.4 g/dL — ABNORMAL LOW (ref 31.5–35.7)
MCV: 86 fL (ref 79–97)
Platelets: 197 10*3/uL (ref 150–450)
RBC: 5.52 x10E6/uL (ref 4.14–5.80)
RDW: 13.5 % (ref 11.6–15.4)
WBC: 7.4 10*3/uL (ref 3.4–10.8)

## 2020-05-12 LAB — VITAMIN D 25 HYDROXY (VIT D DEFICIENCY, FRACTURES): Vit D, 25-Hydroxy: 13.5 ng/mL — ABNORMAL LOW (ref 30.0–100.0)

## 2020-05-12 MED ORDER — CHOLECALCIFEROL 1.25 MG (50000 UT) PO CAPS: 50000.0000 [IU] | ORAL_CAPSULE | ORAL | 0 refills | Status: AC

## 2020-05-15 ENCOUNTER — Other Ambulatory Visit: Payer: Self-pay | Admitting: Cardiology

## 2020-06-14 ENCOUNTER — Other Ambulatory Visit: Payer: Self-pay | Admitting: Family Medicine

## 2020-07-02 DIAGNOSIS — H1045 Other chronic allergic conjunctivitis: Secondary | ICD-10-CM | POA: Diagnosis not present

## 2020-07-02 DIAGNOSIS — H0102A Squamous blepharitis right eye, upper and lower eyelids: Secondary | ICD-10-CM | POA: Diagnosis not present

## 2020-07-02 DIAGNOSIS — H0288B Meibomian gland dysfunction left eye, upper and lower eyelids: Secondary | ICD-10-CM | POA: Diagnosis not present

## 2020-07-02 DIAGNOSIS — E119 Type 2 diabetes mellitus without complications: Secondary | ICD-10-CM | POA: Diagnosis not present

## 2020-07-02 DIAGNOSIS — H0102B Squamous blepharitis left eye, upper and lower eyelids: Secondary | ICD-10-CM | POA: Diagnosis not present

## 2020-07-02 DIAGNOSIS — H0288A Meibomian gland dysfunction right eye, upper and lower eyelids: Secondary | ICD-10-CM | POA: Diagnosis not present

## 2020-07-02 LAB — HM DIABETES EYE EXAM

## 2020-08-06 ENCOUNTER — Other Ambulatory Visit: Payer: Self-pay | Admitting: Family Medicine

## 2020-08-06 DIAGNOSIS — E119 Type 2 diabetes mellitus without complications: Secondary | ICD-10-CM

## 2020-08-09 ENCOUNTER — Encounter: Payer: Self-pay | Admitting: Family Medicine

## 2020-08-09 ENCOUNTER — Ambulatory Visit (INDEPENDENT_AMBULATORY_CARE_PROVIDER_SITE_OTHER): Payer: Medicare Other | Admitting: Family Medicine

## 2020-08-09 VITALS — BP 123/87 | HR 87 | Temp 98.0°F | Ht 75.0 in | Wt 283.2 lb

## 2020-08-09 DIAGNOSIS — I428 Other cardiomyopathies: Secondary | ICD-10-CM

## 2020-08-09 DIAGNOSIS — E559 Vitamin D deficiency, unspecified: Secondary | ICD-10-CM | POA: Diagnosis not present

## 2020-08-09 DIAGNOSIS — N1831 Chronic kidney disease, stage 3a: Secondary | ICD-10-CM | POA: Diagnosis not present

## 2020-08-09 DIAGNOSIS — E1169 Type 2 diabetes mellitus with other specified complication: Secondary | ICD-10-CM

## 2020-08-09 DIAGNOSIS — Z1159 Encounter for screening for other viral diseases: Secondary | ICD-10-CM

## 2020-08-09 DIAGNOSIS — E1122 Type 2 diabetes mellitus with diabetic chronic kidney disease: Secondary | ICD-10-CM

## 2020-08-09 DIAGNOSIS — E785 Hyperlipidemia, unspecified: Secondary | ICD-10-CM | POA: Diagnosis not present

## 2020-08-09 DIAGNOSIS — E1159 Type 2 diabetes mellitus with other circulatory complications: Secondary | ICD-10-CM | POA: Diagnosis not present

## 2020-08-09 DIAGNOSIS — I152 Hypertension secondary to endocrine disorders: Secondary | ICD-10-CM

## 2020-08-09 LAB — BAYER DCA HB A1C WAIVED: HB A1C (BAYER DCA - WAIVED): 6.4 % (ref <7.0)

## 2020-08-09 NOTE — Progress Notes (Signed)
Subjective: CC: DM PCP: Janora Norlander, DO [id number] L Gettel is a 50 y.o. male presenting to clinic today for:  1. Type 2 Diabetes with hypertension, hyperlipidemia, CKD3a:  Patient has been totally compliant with all of his medications.  Blood sugars have been running really well per his mother's report.  No reports of dizziness, chest pain, shortness of breath, swelling.  Appetite is good.  Last eye exam: UTD Last foot exam: UTD Last A1c:  Lab Results  Component Value Date   HGBA1C 7.8 (H) 05/11/2020   Nephropathy screen indicated?: UTD Last flu, zoster and/or pneumovax:  Immunization History  Administered Date(s) Administered  . Influenza,inj,Quad PF,6+ Mos 01/18/2016, 01/10/2017, 01/31/2018, 02/09/2020  . PFIZER(Purple Top)SARS-COV-2 Vaccination 07/17/2019, 08/08/2019, 02/29/2020  . Pneumococcal Polysaccharide-23 01/10/2017  . Tdap 02/09/2020   2. Vit D Deficiency Found to be vitamin D deficient at last visit.  No reports of bone pain.  ROS: Per HPI  No Known Allergies Past Medical History:  Diagnosis Date  . Allergy   . Autism   . CHF (congestive heart failure) ([place])    a. EF 20-25% by echo in 08/2016 b. repeat echo in 02/2017 showing EF 30-35% with cath showing normal cors.   . Diabetes mellitus without complication ([place])   . Obesity (BMI 30-39.9) 10/23/2017    Current Outpatient Medications:  .  carvedilol (COREG) 25 MG tablet, TAKE ONE TABLET BY MOUTH TWICE DAILY, Disp: 180 tablet, Rfl: 3 .  diclofenac Sodium (VOLTAREN) 1 % GEL, Voltaren 1 % topical gel, Disp: , Rfl:  .  Empagliflozin-Linaglip-Metform (TRIJARDY XR) 25-08-998 MG TB24, Take 1 tablet by mouth daily. THE PREVIOUS DOSE WAS INCORRECT. Please replace, Disp: 30 tablet, Rfl: 12 .  ENTRESTO 49-51 MG, TAKE ONE TABLET BY MOUTH TWICE DAILY, Disp: 60 tablet, Rfl: 3 .  furosemide (LASIX) 20 MG tablet, Take 1 tablet (20 mg total) by mouth daily as needed., Disp: 90 tablet, Rfl: 1 .  glipiZIDE  (GLUCOTROL) 5 MG tablet, TAKE 1 TABLET TWICE DAILY BEFORE MEALS, Disp: 180 tablet, Rfl: 0 .  loratadine (CLARITIN) 10 MG tablet, TAKE ONE (1) TABLET EACH DAY, Disp: 90 tablet, Rfl: 3 .  pantoprazole (PROTONIX) 40 MG tablet, TAKE ONE (1) TABLET EACH DAY, Disp: 90 tablet, Rfl: 3 .  potassium chloride SA (KLOR-CON) 20 MEQ tablet, TAKE TWO TABLETS BY MOUTH DAILY, Disp: 180 tablet, Rfl: 2 .  pravastatin (PRAVACHOL) 10 MG tablet, TAKE ONE (1) TABLET EACH DAY, Disp: 30 tablet, Rfl: 1 Social History   Socioeconomic History  . Marital status: Single    Spouse name: Not on file  . Number of children: Not on file  . Years of education: Not on file  . Highest education level: 12th grade  Occupational History  . Occupation: Disabled  Tobacco Use  . Smoking status: Never Smoker  . Smokeless tobacco: Never Used  Vaping Use  . Vaping Use: Never used  Substance and Sexual Activity  . Alcohol use: No  . Drug use: No  . Sexual activity: Not Currently  Other Topics Concern  . Not on file  Social History Narrative   Autistic   Lives with Mother   Social Determinants of Health   Financial Resource Strain: Not on file  Food Insecurity: Not on file  Transportation Needs: Not on file  Physical Activity: Not on file  Stress: Not on file  Social Connections: Not on file  Intimate Partner Violence: Not on file   Family History  Problem Relation  Age of Onset  . Diabetes Mother   . Diabetes Father   . Bladder Cancer Father   . Colon cancer Neg Hx   . Esophageal cancer Neg Hx   . Liver cancer Neg Hx   . Stomach cancer Neg Hx     Objective: Office vital signs reviewed. BP 123/87   Pulse 87   Temp 98 F (36.7 C)   Ht 6\' 3"  (1.905 m)   Wt 283 lb 3.2 oz (128.5 kg)   SpO2 95%   BMI 35.40 kg/m   Physical Examination:  General: Awake, alert, obese, No acute distress HEENT: Normal; sclera white.  Moist mucous membranes Cardio: regular rate and rhythm, S1S2 heard, no murmurs  appreciated Pulm: clear to auscultation bilaterally, no wheezes, rhonchi or rales; normal work of breathing on room air Extremities: warm, well perfused, No edema, cyanosis or clubbing; +2 pulses bilaterally MSK: Ambulating independently  Assessment/ Plan: 50 y.o. male   Type 2 diabetes mellitus with stage 3a chronic kidney disease, without long-term current use of insulin ([place]) - Plan: Bayer DCA Hb A1c Waived, Renal Function Panel, VITAMIN D 25 Hydroxy (Vit-D Deficiency, Fractures)  Hyperlipidemia associated with type 2 diabetes mellitus ([place])  Hypertension associated with diabetes ([place])  Non-ischemic cardiomyopathy ([place])  Vitamin D deficiency - Plan: VITAMIN D 25 Hydroxy (Vit-D Deficiency, Fractures)  Encounter for hepatitis C screening test for low risk patient - Plan: Hepatitis C antibody  Sugars at goal.  Continue current regimen.  Recheck vitamin D given vitamin D deficiency noted on last visit labs  Continue statin  Blood pressure controlled.  Continue current regimen.  Follow-up with cardiology as directed  No orders of the defined types were placed in this encounter.  No orders of the defined types were placed in this encounter.    Janora Norlander, DO [place] [phone]

## 2020-08-10 LAB — HEPATITIS C ANTIBODY: Hep C Virus Ab: <0.1 s/co ratio (ref 0.0–0.9)

## 2020-08-10 LAB — RENAL FUNCTION PANEL
Albumin: 4.3 g/dL (ref 4.0–5.0)
BUN/Creatinine Ratio: 8 — ABNORMAL LOW (ref 9–20)
BUN: 13 mg/dL (ref 6–24)
CO2: 25 mmol/L (ref 20–29)
Calcium: 9.1 mg/dL (ref 8.7–10.2)
Chloride: 103 mmol/L (ref 96–106)
Creatinine, Ser: 1.58 mg/dL — ABNORMAL HIGH (ref 0.76–1.27)
Glucose: 113 mg/dL — ABNORMAL HIGH (ref 65–99)
Phosphorus: 3.4 mg/dL (ref 2.8–4.1)
Potassium: 5.2 mmol/L (ref 3.5–5.2)
Sodium: 142 mmol/L (ref 134–144)
eGFR: 54 mL/min/{1.73_m2} — ABNORMAL LOW (ref >59)

## 2020-08-10 LAB — VITAMIN D 25 HYDROXY (VIT D DEFICIENCY, FRACTURES): Vit D, 25-Hydroxy: 31.7 ng/mL (ref 30.0–100.0)

## 2020-08-13 ENCOUNTER — Other Ambulatory Visit: Payer: Self-pay | Admitting: Family Medicine

## 2020-09-07 ENCOUNTER — Other Ambulatory Visit: Payer: Self-pay | Admitting: Cardiology

## 2020-10-01 ENCOUNTER — Other Ambulatory Visit: Payer: Self-pay | Admitting: Cardiology

## 2020-11-06 ENCOUNTER — Other Ambulatory Visit: Payer: Self-pay | Admitting: Family Medicine

## 2020-11-06 DIAGNOSIS — E119 Type 2 diabetes mellitus without complications: Secondary | ICD-10-CM

## 2020-11-16 ENCOUNTER — Other Ambulatory Visit: Payer: Self-pay

## 2020-11-16 ENCOUNTER — Ambulatory Visit (INDEPENDENT_AMBULATORY_CARE_PROVIDER_SITE_OTHER): Payer: Medicare Other | Admitting: Cardiology

## 2020-11-16 ENCOUNTER — Encounter: Payer: Self-pay | Admitting: Cardiology

## 2020-11-16 VITALS — BP 126/72 | HR 85 | Ht 75.0 in | Wt 289.8 lb

## 2020-11-16 DIAGNOSIS — N1831 Chronic kidney disease, stage 3a: Secondary | ICD-10-CM | POA: Diagnosis not present

## 2020-11-16 DIAGNOSIS — I5022 Chronic systolic (congestive) heart failure: Secondary | ICD-10-CM

## 2020-11-16 NOTE — Progress Notes (Signed)
Clinical Summary Michael Ramos is a 50 y.o.male seen today for follow up of the following medical problems.      1. Chronic systolic HF/NICM - new diagnosis during 08/2016 admission with fluid overload - 02/2017 cath no significant CAD.     07/2017 echo LVEF 35-40%, grade II diastolic dysfunction     99991111 echo: LVEF 50-55%, no WMAs  - no recent SOB/DOE. No recent edema - compliant with meds.        2. Nocturnal hypoxia/Possible sleep apnea - followed by Dr Radford Pax - drop in O2 sats noted during home oximetry. There were discussions about a formal sleep study however family does not feel patient could cooperate with study due to his auticsion     3. Autism   4. CKD - last Cr stable around 1.6, though overall uptrend from last year.    SH: Has 7 brothers and sisters.  Has completed covid vaccine x 3, pfizer.    Past Medical History:  Diagnosis Date   Allergy    Autism    CHF (congestive heart failure) ([place])    a. EF 20-25% by echo in 08/2016 b. repeat echo in 02/2017 showing EF 30-35% with cath showing normal cors.    Diabetes mellitus without complication (HCC)    Obesity (BMI 30-39.9) 10/23/2017     No Known Allergies   Current Outpatient Medications  Medication Sig Dispense Refill   carvedilol (COREG) 25 MG tablet TAKE ONE TABLET BY MOUTH TWICE DAILY 180 tablet 3   diclofenac Sodium (VOLTAREN) 1 % GEL Voltaren 1 % topical gel     Empagliflozin-Linaglip-Metform (TRIJARDY XR) 25-08-998 MG TB24 Take 1 tablet by mouth daily. THE PREVIOUS DOSE WAS INCORRECT. Please replace 30 tablet 12   ENTRESTO 49-51 MG TAKE ONE TABLET BY MOUTH TWICE DAILY 60 tablet 3   furosemide (LASIX) 20 MG tablet TAKE ONE TABLET BY MOUTH DAILY AS NEEDED 90 tablet 1   glipiZIDE (GLUCOTROL) 5 MG tablet TAKE 1 TABLET TWICE DAILY BEFORE MEALS 180 tablet 0   loratadine (CLARITIN) 10 MG tablet TAKE ONE (1) TABLET EACH DAY 90 tablet 3   pantoprazole (PROTONIX) 40 MG tablet TAKE ONE (1) TABLET  EACH DAY 90 tablet 3   potassium chloride SA (KLOR-CON) 20 MEQ tablet TAKE TWO TABLETS BY MOUTH DAILY 180 tablet 2   pravastatin (PRAVACHOL) 10 MG tablet TAKE ONE (1) TABLET EACH DAY 30 tablet 5   No current facility-administered medications for this visit.     Past Surgical History:  Procedure Laterality Date   RIGHT/LEFT HEART CATH AND CORONARY ANGIOGRAPHY N/A 03/06/2017   Procedure: RIGHT/LEFT HEART CATH AND CORONARY ANGIOGRAPHY;  Surgeon: Burnell Blanks, MD;  Location: [facility];  Service: Cardiovascular;  Laterality: N/A;     No Known Allergies    Family History  Problem Relation Age of Onset   Diabetes Mother    Diabetes Father    Bladder Cancer Father    Colon cancer Neg Hx    Esophageal cancer Neg Hx    Liver cancer Neg Hx    Stomach cancer Neg Hx      Social History Mr. Rittenhouse reports that he has never smoked. He has never used smokeless tobacco. Mr. Locastro reports no history of alcohol use.   Review of Systems CONSTITUTIONAL: No weight loss, fever, chills, weakness or fatigue.  HEENT: Eyes: No visual loss, blurred vision, double vision or yellow sclerae.No hearing loss, sneezing, congestion, runny nose or sore  throat.  SKIN: No rash or itching.  CARDIOVASCULAR: per hpi RESPIRATORY: No shortness of breath, cough or sputum.  GASTROINTESTINAL: No anorexia, nausea, vomiting or diarrhea. No abdominal pain or blood.  GENITOURINARY: No burning on urination, no polyuria NEUROLOGICAL: No headache, dizziness, syncope, paralysis, ataxia, numbness or tingling in the extremities. No change in bowel or bladder control.  MUSCULOSKELETAL: No muscle, back pain, joint pain or stiffness.  LYMPHATICS: No enlarged nodes. No history of splenectomy.  PSYCHIATRIC: No history of depression or anxiety.  ENDOCRINOLOGIC: No reports of sweating, cold or heat intolerance. No polyuria or polydipsia.  Marland Kitchen   Physical Examination Today's Vitals   11/16/20 1540  BP:  126/72  Pulse: 85  SpO2: 95%  Weight: 289 lb 12.8 oz (131.5 kg)  Height: '6\' 3"'$  (1.905 m)   Body mass index is 36.22 kg/m.  Gen: resting comfortably, no acute distress HEENT: no scleral icterus, pupils equal round and reactive, no palptable cervical adenopathy,  CV: RRR, no mr/g, no jvd Resp: Clear to auscultation bilaterally GI: abdomen is soft, non-tender, non-distended, normal bowel sounds, no hepatosplenomegaly MSK: extremities are warm, no edema.  Skin: warm, no rash Neuro:  no focal deficits Psych: appropriate affect   Diagnostic Studies 02/2017 cath Hemodynamic findings consistent with mild pulmonary hypertension.   1. No angiographic evidence of CAD 2. Non-ischemic cardiomyopathy   Recommendations: medical management of cardiomyopathy and CHF.      02/2017 echo Study Conclusions   - Left ventricle: The cavity size was mildly dilated. Wall   thickness was normal. Systolic function was moderately to   severely reduced. The estimated ejection fraction was in the   range of 30% to 35%. Diffuse hypokinesis. Diastolic dysfunction,   grade indeterminate. Indeterminate filling pressures. - Mitral valve: There was mild regurgitation.     07/2017 echo Study Conclusions   - Left ventricle: The cavity size was mildly dilated. Wall   thickness was normal. Systolic function was moderately reduced.   The estimated ejection fraction was in the range of 35% to 40%.   Diffuse hypokinesis. Features are consistent with a pseudonormal   left ventricular filling pattern, with concomitant abnormal   relaxation and increased filling pressure (grade 2 diastolic   dysfunction). - Mitral valve: There was mild regurgitation.     09/2019 echo 1. Left ventricular ejection fraction, by estimation, is 50 to 55%. The  left ventricle has normal function. The left ventricle has no regional  wall motion abnormalities. Left ventricular diastolic parameters were  normal.   2. Right  ventricular systolic function is normal. The right ventricular  size is normal. There is normal pulmonary artery systolic pressure.   3. The mitral valve is normal in structure. No evidence of mitral valve  regurgitation. No evidence of mitral stenosis.   4. The aortic valve was not well visualized. Aortic valve regurgitation  is not visualized. No aortic stenosis is present.   5. The inferior vena cava is normal in size with greater than 50%  respiratory variability, suggesting right atrial pressure of 3 mmHg.      Assessment and Plan  1. Chronic systolic HF/ NICM - medical therapy limited by his renal function and soft bp's - by most recent echo LVEF has noramlzied -doing well without recent symptoms - cntinue current meds  2. CKD IIIa - some downtrend in Cr, continue to monitor.    F/u 6 months  Arnoldo Lenis, M.D.

## 2020-11-16 NOTE — Patient Instructions (Addendum)
Medication Instructions:  Continue all medications.     Labwork: none  Testing/Procedures: none  Follow-Up: 6 months   Any Other Special Instructions Will Be Listed Below (If Applicable).  If you need a refill on your cardiac medications before your next appointment, please call your pharmacy.

## 2020-11-19 ENCOUNTER — Encounter: Payer: Self-pay | Admitting: Family Medicine

## 2020-11-19 ENCOUNTER — Ambulatory Visit (INDEPENDENT_AMBULATORY_CARE_PROVIDER_SITE_OTHER): Payer: Medicare Other | Admitting: Family Medicine

## 2020-11-19 ENCOUNTER — Other Ambulatory Visit: Payer: Self-pay

## 2020-11-19 VITALS — BP 119/81 | HR 84 | Temp 97.3°F | Ht 75.0 in | Wt 286.6 lb

## 2020-11-19 DIAGNOSIS — I428 Other cardiomyopathies: Secondary | ICD-10-CM

## 2020-11-19 DIAGNOSIS — I5022 Chronic systolic (congestive) heart failure: Secondary | ICD-10-CM | POA: Diagnosis not present

## 2020-11-19 DIAGNOSIS — N1831 Chronic kidney disease, stage 3a: Secondary | ICD-10-CM | POA: Diagnosis not present

## 2020-11-19 DIAGNOSIS — E1122 Type 2 diabetes mellitus with diabetic chronic kidney disease: Secondary | ICD-10-CM

## 2020-11-19 DIAGNOSIS — E785 Hyperlipidemia, unspecified: Secondary | ICD-10-CM

## 2020-11-19 DIAGNOSIS — N289 Disorder of kidney and ureter, unspecified: Secondary | ICD-10-CM | POA: Diagnosis not present

## 2020-11-19 DIAGNOSIS — N189 Chronic kidney disease, unspecified: Secondary | ICD-10-CM | POA: Diagnosis not present

## 2020-11-19 DIAGNOSIS — E1169 Type 2 diabetes mellitus with other specified complication: Secondary | ICD-10-CM | POA: Diagnosis not present

## 2020-11-19 LAB — BAYER DCA HB A1C WAIVED: HB A1C (BAYER DCA - WAIVED): 6.6 % (ref <7.0)

## 2020-11-19 NOTE — Progress Notes (Signed)
Subjective: CC: Dm PCP: Janora Norlander, DO [id number] L Degroot is a 50 y.o. male presenting to clinic today for:  1. Type 2 Diabetes with hypertension, hyperlipidemia, CHF Patient is accompanied today's visit by his mother.  She notes that he got a recent good checkup with his cardiologist stating that his heart function was back in normal range.  He has been continued on Entresto, Pravachol, glipizide and Trijardy XR.  He is also taking Coreg and has Lasix on board but his cardiologist felt that he did not need the Lasix on a daily basis.  His mother plans on adjusting this soon. Mr Jemmott denies any dysuria, hematuria, penile pain or discoloration.  He does urinate frequently hence the decrease on Lasix usage  Last eye exam: Up-to-date Last foot exam: Up-to-date Last A1c:  Lab Results  Component Value Date   HGBA1C 6.4 08/09/2020   Nephropathy screen indicated?:  On ARB Last flu, zoster and/or pneumovax:  Immunization History  Administered Date(s) Administered   Influenza,inj,Quad PF,6+ Mos 01/18/2016, 01/10/2017, 01/31/2018, 02/09/2020   PFIZER(Purple Top)SARS-COV-2 Vaccination 07/17/2019, 08/08/2019, 02/29/2020   Pneumococcal Polysaccharide-23 01/10/2017   Tdap 02/09/2020   ROS: Per HPI  No Known Allergies Past Medical History:  Diagnosis Date   Allergy    Autism    CHF (congestive heart failure) ([place])    a. EF 20-25% by echo in 08/2016 b. repeat echo in 02/2017 showing EF 30-35% with cath showing normal cors.    Diabetes mellitus without complication (HCC)    Obesity (BMI 30-39.9) 10/23/2017    Current Outpatient Medications:    carvedilol (COREG) 25 MG tablet, TAKE ONE TABLET BY MOUTH TWICE DAILY, Disp: 180 tablet, Rfl: 3   diclofenac Sodium (VOLTAREN) 1 % GEL, Voltaren 1 % topical gel, Disp: , Rfl:    Empagliflozin-Linaglip-Metform (TRIJARDY XR) 25-08-998 MG TB24, Take 1 tablet by mouth daily. THE PREVIOUS DOSE WAS INCORRECT. Please replace, Disp: 30 tablet,  Rfl: 12   ENTRESTO 49-51 MG, TAKE ONE TABLET BY MOUTH TWICE DAILY, Disp: 60 tablet, Rfl: 3   furosemide (LASIX) 20 MG tablet, TAKE ONE TABLET BY MOUTH DAILY AS NEEDED, Disp: 90 tablet, Rfl: 1   glipiZIDE (GLUCOTROL) 5 MG tablet, TAKE 1 TABLET TWICE DAILY BEFORE MEALS, Disp: 180 tablet, Rfl: 0   loratadine (CLARITIN) 10 MG tablet, TAKE ONE (1) TABLET EACH DAY, Disp: 90 tablet, Rfl: 3   pantoprazole (PROTONIX) 40 MG tablet, TAKE ONE (1) TABLET EACH DAY, Disp: 90 tablet, Rfl: 3   potassium chloride SA (KLOR-CON) 20 MEQ tablet, TAKE TWO TABLETS BY MOUTH DAILY, Disp: 180 tablet, Rfl: 2   pravastatin (PRAVACHOL) 10 MG tablet, TAKE ONE (1) TABLET EACH DAY, Disp: 30 tablet, Rfl: 5 Social History   Socioeconomic History   Marital status: Single    Spouse name: Not on file   Number of children: Not on file   Years of education: Not on file   Highest education level: 12th grade  Occupational History   Occupation: Disabled  Tobacco Use   Smoking status: Never   Smokeless tobacco: Never  Vaping Use   Vaping Use: Never used  Substance and Sexual Activity   Alcohol use: No   Drug use: No   Sexual activity: Not Currently  Other Topics Concern   Not on file  Social History Narrative   Autistic   Lives with Mother   Social Determinants of Health   Financial Resource Strain: Not on file  Food Insecurity: Not on file  Transportation Needs: Not on file  Physical Activity: Not on file  Stress: Not on file  Social Connections: Not on file  Intimate Partner Violence: Not on file   Family History  Problem Relation Age of Onset   Diabetes Mother    Diabetes Father    Bladder Cancer Father    Colon cancer Neg Hx    Esophageal cancer Neg Hx    Liver cancer Neg Hx    Stomach cancer Neg Hx     Objective: Office vital signs reviewed. BP 119/81   Pulse 84   Temp (!) 97.3 F (36.3 C)   Ht '6\' 3"'$  (1.905 m)   Wt 286 lb 9.6 oz (130 kg)   SpO2 94%   BMI 35.82 kg/m   Physical Examination:   General: Awake, alert, well nourished, No acute distress HEENT: Normal; sclera white Cardio: regular rate and rhythm, S1S2 heard, no murmurs appreciated Pulm: clear to auscultation bilaterally, no wheezes, rhonchi or rales; normal work of breathing on room air Extremities: No edema.  Assessment/ Plan: 50 y.o. male   Type 2 diabetes mellitus with stage 3a chronic kidney disease, without long-term current use of insulin ([place]) - Plan: Renal function panel, Bayer DCA Hb A1c Waived  Hyperlipidemia associated with type 2 diabetes mellitus ([place])  Chronic systolic heart failure (HCC)  Non-ischemic cardiomyopathy ([place])  Sugar under excellent control.  Continue current regimen.  Check renal function panel  Continue statin.  Not yet due for fasting lipid  Heart failure is improving based on 09/28/2019 EF of 50-55%.  He will be continued on Entresto as outlined by his cardiologist.  Reinforced Lasix is intended for as needed use.  Discussed that if he has a 3 pound weight gain or evidence of other fluid overload that this medication should be used and we should be contacted.  His mother voiced good understanding.  Okay to follow-up in 4 months since blood sugars well controlled  Orders Placed This Encounter  Procedures   Renal function panel   Bayer DCA Hb A1c Waived   No orders of the defined types were placed in this encounter.    Janora Norlander, DO [place] [phone]

## 2020-11-19 NOTE — Patient Instructions (Signed)
Lasix ONLY as needed.  Generally, we use if there is a 3lb weight gain as this would indicate fluid/ swelling

## 2020-11-20 LAB — RENAL FUNCTION PANEL
Albumin: 4 g/dL (ref 4.0–5.0)
BUN/Creatinine Ratio: 8 — ABNORMAL LOW (ref 9–20)
BUN: 11 mg/dL (ref 6–24)
CO2: 25 mmol/L (ref 20–29)
Calcium: 9.2 mg/dL (ref 8.7–10.2)
Chloride: 101 mmol/L (ref 96–106)
Creatinine, Ser: 1.45 mg/dL — ABNORMAL HIGH (ref 0.76–1.27)
Glucose: 255 mg/dL — ABNORMAL HIGH (ref 65–99)
Phosphorus: 2.7 mg/dL — ABNORMAL LOW (ref 2.8–4.1)
Potassium: 5 mmol/L (ref 3.5–5.2)
Sodium: 140 mmol/L (ref 134–144)
eGFR: 59 mL/min/{1.73_m2} — ABNORMAL LOW (ref >59)

## 2020-11-23 NOTE — Progress Notes (Signed)
Returning nurse call.

## 2020-12-04 ENCOUNTER — Other Ambulatory Visit: Payer: Self-pay | Admitting: Family Medicine

## 2021-01-03 ENCOUNTER — Other Ambulatory Visit: Payer: Self-pay | Admitting: Cardiology

## 2021-01-24 ENCOUNTER — Other Ambulatory Visit: Payer: Self-pay | Admitting: Family Medicine

## 2021-01-24 DIAGNOSIS — E119 Type 2 diabetes mellitus without complications: Secondary | ICD-10-CM

## 2021-01-26 ENCOUNTER — Other Ambulatory Visit: Payer: Self-pay | Admitting: Family Medicine

## 2021-01-26 DIAGNOSIS — E119 Type 2 diabetes mellitus without complications: Secondary | ICD-10-CM

## 2021-02-04 ENCOUNTER — Ambulatory Visit (INDEPENDENT_AMBULATORY_CARE_PROVIDER_SITE_OTHER): Payer: Medicare Other

## 2021-02-04 VITALS — Ht 75.0 in | Wt 292.0 lb

## 2021-02-04 DIAGNOSIS — Z0000 Encounter for general adult medical examination without abnormal findings: Secondary | ICD-10-CM | POA: Diagnosis not present

## 2021-02-04 NOTE — Patient Instructions (Signed)
Michael Ramos , Thank you for taking time to come for your Medicare Wellness Visit. I appreciate your ongoing commitment to your health goals. Please review the following plan we discussed and let me know if I can assist you in the future.   Screening recommendations/referrals: Colonoscopy: Done 11/20/2018 - Repeat in 10 years Recommended yearly ophthalmology/optometry visit for glaucoma screening and checkup Recommended yearly dental visit for hygiene and checkup  Vaccinations: Influenza vaccine: Done 02/09/2020 - Repeat annually Pneumococcal vaccine: Done 01/10/2017 Tdap vaccine: Done 02/09/2020 - Repeat in 10 years Shingles vaccine: Due at age 72   Covid-19: Done 07/17/2019, 08/08/2019, & 02/29/2020  Advanced directives: Please bring a copy of your health care power of attorney and living will to the office to be added to your chart at your convenience.   Conditions/risks identified: Aim for 30 minutes of exercise or brisk walking each day, drink 6-8 glasses of water and eat lots of fruits and vegetables.   Next appointment: Follow up in one year for your annual wellness visit   Preventive Care 40-64 Years, Male Preventive care refers to lifestyle choices and visits with your health care provider that can promote health and wellness. What does preventive care include? A yearly physical exam. This is also called an annual well check. Dental exams once or twice a year. Routine eye exams. Ask your health care provider how often you should have your eyes checked. Personal lifestyle choices, including: Daily care of your teeth and gums. Regular physical activity. Eating a healthy diet. Avoiding tobacco and drug use. Limiting alcohol use. Practicing safe sex. Taking low-dose aspirin every day starting at age 102. What happens during an annual well check? The services and screenings done by your health care provider during your annual well check will depend on your age, overall health, lifestyle  risk factors, and family history of disease. Counseling  Your health care provider may ask you questions about your: Alcohol use. Tobacco use. Drug use. Emotional well-being. Home and relationship well-being. Sexual activity. Eating habits. Work and work Statistician. Screening  You may have the following tests or measurements: Height, weight, and BMI. Blood pressure. Lipid and cholesterol levels. These may be checked every 5 years, or more frequently if you are over 39 years old. Skin check. Lung cancer screening. You may have this screening every year starting at age 50 if you have a 30-pack-year history of smoking and currently smoke or have quit within the past 15 years. Fecal occult blood test (FOBT) of the stool. You may have this test every year starting at age 82. Flexible sigmoidoscopy or colonoscopy. You may have a sigmoidoscopy every 5 years or a colonoscopy every 10 years starting at age 22. Prostate cancer screening. Recommendations will vary depending on your family history and other risks. Hepatitis C blood test. Hepatitis B blood test. Sexually transmitted disease (STD) testing. Diabetes screening. This is done by checking your blood sugar (glucose) after you have not eaten for a while (fasting). You may have this done every 1-3 years. Discuss your test results, treatment options, and if necessary, the need for more tests with your health care provider. Vaccines  Your health care provider may recommend certain vaccines, such as: Influenza vaccine. This is recommended every year. Tetanus, diphtheria, and acellular pertussis (Tdap, Td) vaccine. You may need a Td booster every 10 years. Zoster vaccine. You may need this after age 60. Pneumococcal 13-valent conjugate (PCV13) vaccine. You may need this if you have certain conditions and have  not been vaccinated. Pneumococcal polysaccharide (PPSV23) vaccine. You may need one or two doses if you smoke cigarettes or if you have  certain conditions. Talk to your health care provider about which screenings and vaccines you need and how often you need them. This information is not intended to replace advice given to you by your health care provider. Make sure you discuss any questions you have with your health care provider. Document Released: 04/23/2015 Document Revised: 12/15/2015 Document Reviewed: 01/26/2015 Elsevier Interactive Patient Education  2017 [employer] Prevention in the Home Falls can cause injuries. They can happen to people of all ages. There are many things you can do to make your home safe and to help prevent falls. What can I do on the outside of my home? Regularly fix the edges of walkways and driveways and fix any cracks. Remove anything that might make you trip as you walk through a door, such as a raised step or threshold. Trim any bushes or trees on the path to your home. Use bright outdoor lighting. Clear any walking paths of anything that might make someone trip, such as rocks or tools. Regularly check to see if handrails are loose or broken. Make sure that both sides of any steps have handrails. Any raised decks and porches should have guardrails on the edges. Have any leaves, snow, or ice cleared regularly. Use sand or salt on walking paths during winter. Clean up any spills in your garage right away. This includes oil or grease spills. What can I do in the bathroom? Use night lights. Install grab bars by the toilet and in the tub and shower. Do not use towel bars as grab bars. Use non-skid mats or decals in the tub or shower. If you need to sit down in the shower, use a plastic, non-slip stool. Keep the floor dry. Clean up any water that spills on the floor as soon as it happens. Remove soap buildup in the tub or shower regularly. Attach bath mats securely with double-sided non-slip rug tape. Do not have throw rugs and other things on the floor that can make you trip. What can  I do in the bedroom? Use night lights. Make sure that you have a light by your bed that is easy to reach. Do not use any sheets or blankets that are too big for your bed. They should not hang down onto the floor. Have a firm chair that has side arms. You can use this for support while you get dressed. Do not have throw rugs and other things on the floor that can make you trip. What can I do in the kitchen? Clean up any spills right away. Avoid walking on wet floors. Keep items that you use a lot in easy-to-reach places. If you need to reach something above you, use a strong step stool that has a grab bar. Keep electrical cords out of the way. Do not use floor polish or wax that makes floors slippery. If you must use wax, use non-skid floor wax. Do not have throw rugs and other things on the floor that can make you trip. What can I do with my stairs? Do not leave any items on the stairs. Make sure that there are handrails on both sides of the stairs and use them. Fix handrails that are broken or loose. Make sure that handrails are as long as the stairways. Check any carpeting to make sure that it is firmly attached to the stairs. Fix  any carpet that is loose or worn. Avoid having throw rugs at the top or bottom of the stairs. If you do have throw rugs, attach them to the floor with carpet tape. Make sure that you have a light switch at the top of the stairs and the bottom of the stairs. If you do not have them, ask someone to add them for you. What else can I do to help prevent falls? Wear shoes that: Do not have high heels. Have rubber bottoms. Are comfortable and fit you well. Are closed at the toe. Do not wear sandals. If you use a stepladder: Make sure that it is fully opened. Do not climb a closed stepladder. Make sure that both sides of the stepladder are locked into place. Ask someone to hold it for you, if possible. Clearly mark and make sure that you can see: Any grab bars or  handrails. First and last steps. Where the edge of each step is. Use tools that help you move around (mobility aids) if they are needed. These include: Canes. Walkers. Scooters. Crutches. Turn on the lights when you go into a dark area. Replace any light bulbs as soon as they burn out. Set up your furniture so you have a clear path. Avoid moving your furniture around. If any of your floors are uneven, fix them. If there are any pets around you, be aware of where they are. Review your medicines with your doctor. Some medicines can make you feel dizzy. This can increase your chance of falling. Ask your doctor what other things that you can do to help prevent falls. This information is not intended to replace advice given to you by your health care provider. Make sure you discuss any questions you have with your health care provider. Document Released: 01/21/2009 Document Revised: 09/02/2015 Document Reviewed: 05/01/2014 Elsevier Interactive Patient Education  2017 [employer].

## 2021-02-04 NOTE — Progress Notes (Signed)
Subjective:   Michael Ramos is a 50 y.o. male who presents for Medicare Annual/Subsequent preventive examination.  Virtual Visit via Telephone Note  I connected with  Michael Ramos on 02/04/21 at 11:15 AM EDT by telephone and verified that I am speaking with the correct person using two identifiers.  Location: Patient: Home Provider: [facility] Persons participating in the virtual visit: patient / mother / Nurse Health Advisor   I discussed the limitations, risks, security and privacy concerns of performing an evaluation and management service by telephone and the availability of in person appointments. The patient expressed understanding and agreed to proceed.  Interactive audio and video telecommunications were attempted between this nurse and patient, however failed, due to patient having technical difficulties OR patient did not have access to video capability.  We continued and completed visit with audio only.  Some vital signs may be absent or patient reported.   Deniah Saia E Tavon Corriher, LPN   Review of Systems     Cardiac Risk Factors include: diabetes mellitus;sedentary lifestyle;obesity (BMI >30kg/m2);family history of premature cardiovascular disease;hypertension;dyslipidemia;male gender;Other (see comment), Risk factor comments: CHF, cardiomyopathy     Objective:    Today's Vitals   02/04/21 1112  Weight: 292 lb (132.5 kg)  Height: 6\' 3"  (1.905 m)   Body mass index is 36.5 kg/m.  Advanced Directives 02/04/2021 10/25/2018 12/31/2017 03/06/2017 08/15/2016 08/15/2016 08/15/2016  Does Patient Have a Medical Advance Directive? No Yes No No Yes Yes Yes  Type of Advance Directive - Healthcare Power of [place]  Does patient want to make changes to medical advance directive? - No - Patient declined - - No - Patient declined - -  Copy of [place] in Chart? - No - copy requested -  - No - copy requested No - copy requested No - copy requested  Would patient like information on creating a medical advance directive? No - Patient declined - No - Patient declined No - Patient declined No - Patient declined No - Patient declined No - Patient declined    Current Medications (verified) Outpatient Encounter Medications as of 02/04/2021  Medication Sig   carvedilol (COREG) 25 MG tablet TAKE ONE TABLET BY MOUTH TWICE DAILY   diclofenac Sodium (VOLTAREN) 1 % GEL Voltaren 1 % topical gel   Empagliflozin-Linaglip-Metform (TRIJARDY XR) 25-08-998 MG TB24 Take 1 tablet by mouth daily. THE PREVIOUS DOSE WAS INCORRECT. Please replace   ENTRESTO 49-51 MG TAKE ONE TABLET BY MOUTH TWICE DAILY   furosemide (LASIX) 20 MG tablet TAKE ONE TABLET BY MOUTH DAILY AS NEEDED   glipiZIDE (GLUCOTROL) 5 MG tablet TAKE 1 TABLET TWICE DAILY BEFORE MEALS   loratadine (CLARITIN) 10 MG tablet TAKE ONE (1) TABLET EACH DAY   pantoprazole (PROTONIX) 40 MG tablet TAKE ONE (1) TABLET EACH DAY   potassium chloride SA (KLOR-CON) 20 MEQ tablet TAKE TWO TABLETS BY MOUTH DAILY   pravastatin (PRAVACHOL) 10 MG tablet TAKE ONE (1) TABLET EACH DAY   No facility-administered encounter medications on file as of 02/04/2021.    Allergies (verified) Patient has no known allergies.   History: Past Medical History:  Diagnosis Date   Allergy    Autism    CHF (congestive heart failure) ([place])    a. EF 20-25% by echo in 08/2016 b. repeat echo in 02/2017 showing EF 30-35% with cath showing normal cors.    Diabetes mellitus without complication ([place])  Obesity (BMI 30-39.9) 10/23/2017   Past Surgical History:  Procedure Laterality Date   RIGHT/LEFT HEART CATH AND CORONARY ANGIOGRAPHY N/A 03/06/2017   Procedure: RIGHT/LEFT HEART CATH AND CORONARY ANGIOGRAPHY;  Surgeon: Burnell Blanks, MD;  Location: [facility];  Service: Cardiovascular;  Laterality: N/A;   Family History  Problem Relation Age of Onset    Diabetes Mother    Diabetes Father    Bladder Cancer Father    Michael cancer Neg Hx    Esophageal cancer Neg Hx    Liver cancer Neg Hx    Stomach cancer Neg Hx    Social History   Socioeconomic History   Marital status: Single    Spouse name: Not on file   Number of children: Not on file   Years of education: Not on file   Highest education level: 12th grade  Occupational History   Occupation: Disabled  Tobacco Use   Smoking status: Never   Smokeless tobacco: Never  Vaping Use   Vaping Use: Never used  Substance and Sexual Activity   Alcohol use: No   Drug use: No   Sexual activity: Not Currently  Other Topics Concern   Not on file  Social History Narrative   Autistic   Lives with Mother   Social Determinants of Health   Financial Resource Strain: Low Risk    Difficulty of Paying Living Expenses: Not hard at all  Food Insecurity: No Food Insecurity   Worried About Charity fundraiser in the Last Year: Never true   Ran Out of Food in the Last Year: Never true  Transportation Needs: No Transportation Needs   Lack of Transportation (Medical): No   Lack of Transportation (Non-Medical): No  Physical Activity: Insufficiently Active   Days of Exercise per Week: 7 days   Minutes of Exercise per Session: 20 min  Stress: No Stress Concern Present   Feeling of Stress : Only a little  Social Connections: Socially Isolated   Frequency of Communication with Friends and Family: More than three times a week   Frequency of Social Gatherings with Friends and Family: More than three times a week   Attends Religious Services: Never   Marine scientist or Organizations: No   Attends Music therapist: Never   Marital Status: Never married    Tobacco Counseling Counseling given: Not Answered   Clinical Intake:  Pre-visit preparation completed: Yes  Pain : No/denies pain     BMI - recorded: 36.5 Nutritional Status: BMI > 30  Obese Nutritional Risks:  None Diabetes: Yes CBG done?: No Did pt. bring in CBG monitor from home?: No  How often do you need to have someone help you when you read instructions, pamphlets, or other written materials from your doctor or pharmacy?: 3 - Sometimes  Diabetic? Yes Nutrition Risk Assessment:  Has the patient had any N/V/D within the last 2 months?  No  Does the patient have any non-healing wounds?  No  Has the patient had any unintentional weight loss or weight gain?  No   Diabetes:  Is the patient diabetic?  Yes  If diabetic, was a CBG obtained today?  No  Did the patient bring in their glucometer from home?  No  How often do you monitor your CBG's? BID.   Financial Strains and Diabetes Management:  Are you having any financial strains with the device, your supplies or your medication? No .  Does the patient want to be  seen by Chronic Care Management for management of their diabetes?  No  Would the patient like to be referred to a Nutritionist or for Diabetic Management?  No   Diabetic Exams:  Diabetic Eye Exam: Completed 07/02/2020.  Diabetic Foot Exam: Completed 10/31/2019. Pt has been advised about the importance in completing this exam. Pt is scheduled for diabetic foot exam on 02/24/2020.    Interpreter Needed?: No  Information entered by :: Shep Porter, LPN   Activities of Daily Living In your present state of health, do you have any difficulty performing the following activities: 02/04/2021  Hearing? N  Vision? N  Difficulty concentrating or making decisions? Y  Walking or climbing stairs? N  Dressing or bathing? N  Doing errands, shopping? Y  Preparing Food and eating ? N  Using the Toilet? N  In the past six months, have you accidently leaked urine? N  Do you have problems with loss of bowel control? N  Managing your Medications? N  Managing your Finances? Y  Housekeeping or managing your Housekeeping? N  Some recent data might be hidden    Patient Care  Team: Janora Norlander, DO as PCP - General (Family Medicine) Harl Bowie Alphonse Guild, MD as PCP - Cardiology (Cardiology)  Indicate any recent Medical Services you may have received from other than [facility] providers in the past year (date may be approximate).     Assessment:   This is a routine wellness examination for [place].  Hearing/Vision screen Hearing Screening - Comments:: Denies hearing difficulties  Vision Screening - Comments:: Up to date with annual eye exams with [facility] in [place] - no vision concerns  Dietary issues and exercise activities discussed: Current Exercise Habits: Home exercise routine, Type of exercise: walking, Time (Minutes): 10, Frequency (Times/Week): 7, Weekly Exercise (Minutes/Week): 70, Intensity: Mild, Exercise limited by: psychological condition(s);cardiac condition(s)   Goals Addressed   None    Depression Screen PHQ 2/9 Scores 02/04/2021 11/19/2020 08/09/2020 05/11/2020 02/09/2020 10/31/2019 10/25/2018  PHQ - 2 Score 0 0 0 0 0 0 0  PHQ- 9 Score - - - 0 0 - -  Exception Documentation - - - - - - -  Not completed - - - - - - -    Fall Risk Fall Risk  02/04/2021 11/19/2020 08/09/2020 10/31/2019 10/25/2018  Falls in the past year? 0 0 0 0 0  Number falls in past yr: 0 - - - 0  Injury with Fall? 0 - - - 0    FALL RISK PREVENTION PERTAINING TO THE HOME:  Any stairs in or around the home? No  If so, are there any without handrails? No  Home free of loose throw rugs in walkways, pet beds, electrical cords, etc? Yes  Adequate lighting in your home to reduce risk of falls? Yes   ASSISTIVE DEVICES UTILIZED TO PREVENT FALLS:  Life alert? No  Use of a cane, walker or w/c? No  Grab bars in the bathroom? No  Shower chair or bench in shower? Yes  Elevated toilet seat or a handicapped toilet? No   TIMED UP AND GO:  Was the test performed? No . Telephonic visit  Cognitive Function: autism - mother answered most questions for him - she says he can read and  write and remember to take his medications each day     6CIT Screen 10/25/2018  What Year? (No Data)    Immunizations Immunization History  Administered Date(s) Administered   Influenza,inj,Quad PF,6+ Mos 01/18/2016, 01/10/2017, 01/31/2018,  02/09/2020   PFIZER(Purple Top)SARS-COV-2 Vaccination 07/17/2019, 08/08/2019, 02/29/2020   Pneumococcal Polysaccharide-23 01/10/2017   Tdap 02/09/2020    TDAP status: Up to date  Flu Vaccine status: Due, Education has been provided regarding the importance of this vaccine. Advised may receive this vaccine at local pharmacy or Health Dept. Aware to provide a copy of the vaccination record if obtained from local pharmacy or Health Dept. Verbalized acceptance and understanding.  Pneumococcal vaccine status: Up to date  Covid-19 vaccine status: Completed vaccines  Qualifies for Shingles Vaccine? No   Zostavax completed No    Screening Tests Health Maintenance  Topic Date Due   Pneumococcal Vaccine 44-21 Years old (2 - PCV) 01/10/2018   COVID-19 Vaccine (4 - Booster for Pfizer series) 04/25/2020   FOOT EXAM  10/30/2020   INFLUENZA VACCINE  11/08/2020   HEMOGLOBIN A1C  05/22/2021   OPHTHALMOLOGY EXAM  07/02/2021   COLONOSCOPY (Pts 45-72yrs Insurance coverage will need to be confirmed)  11/19/2028   TETANUS/TDAP  02/08/2030   Hepatitis C Screening  Completed   HIV Screening  Completed   HPV VACCINES  Aged Out    Health Maintenance  Health Maintenance Due  Topic Date Due   Pneumococcal Vaccine 48-22 Years old (2 - PCV) 01/10/2018   COVID-19 Vaccine (4 - Booster for Pfizer series) 04/25/2020   FOOT EXAM  10/30/2020   INFLUENZA VACCINE  11/08/2020    Colorectal cancer screening: Type of screening: Colonoscopy. Completed 11/20/2018. Repeat every 10 years  Lung Cancer Screening: (Low Dose CT Chest recommended if Age 35-80 years, 30 pack-year currently smoking OR have quit w/in 15years.) does not qualify.   Additional  Screening:  Hepatitis C Screening: does not qualify  Vision Screening: Recommended annual ophthalmology exams for early detection of glaucoma and other disorders of the eye. Is the patient up to date with their annual eye exam?  Yes  Who is the provider or what is the name of the office in which the patient attends annual eye exams? [facility] If pt is not established with a provider, would they like to be referred to a provider to establish care? No .   Dental Screening: Recommended annual dental exams for proper oral hygiene  Community Resource Referral / Chronic Care Management: CRR required this visit?  No   CCM required this visit?  No      Plan:     I have personally reviewed and noted the following in the patient's chart:   Medical and social history Use of alcohol, tobacco or illicit drugs  Current medications and supplements including opioid prescriptions. Patient is not currently taking opioid prescriptions. Functional ability and status Nutritional status Physical activity Advanced directives List of other physicians Hospitalizations, surgeries, and ER visits in previous 12 months Vitals Screenings to include cognitive, depression, and falls Referrals and appointments  In addition, I have reviewed and discussed with patient certain preventive protocols, quality metrics, and best practice recommendations. A written personalized care plan for preventive services as well as general preventive health recommendations were provided to patient.     Sandrea Hammond, LPN   [id number]   Nurse Notes: none

## 2021-02-12 DIAGNOSIS — Z23 Encounter for immunization: Secondary | ICD-10-CM | POA: Diagnosis not present

## 2021-02-23 ENCOUNTER — Ambulatory Visit (INDEPENDENT_AMBULATORY_CARE_PROVIDER_SITE_OTHER): Payer: Medicare Other | Admitting: Family Medicine

## 2021-02-23 ENCOUNTER — Other Ambulatory Visit: Payer: Self-pay

## 2021-02-23 ENCOUNTER — Encounter: Payer: Self-pay | Admitting: Family Medicine

## 2021-02-23 VITALS — BP 124/83 | HR 79 | Temp 97.5°F | Ht 75.0 in | Wt 284.6 lb

## 2021-02-23 DIAGNOSIS — I152 Hypertension secondary to endocrine disorders: Secondary | ICD-10-CM | POA: Diagnosis not present

## 2021-02-23 DIAGNOSIS — E1122 Type 2 diabetes mellitus with diabetic chronic kidney disease: Secondary | ICD-10-CM

## 2021-02-23 DIAGNOSIS — E1169 Type 2 diabetes mellitus with other specified complication: Secondary | ICD-10-CM

## 2021-02-23 DIAGNOSIS — E785 Hyperlipidemia, unspecified: Secondary | ICD-10-CM

## 2021-02-23 DIAGNOSIS — I428 Other cardiomyopathies: Secondary | ICD-10-CM | POA: Diagnosis not present

## 2021-02-23 DIAGNOSIS — E1159 Type 2 diabetes mellitus with other circulatory complications: Secondary | ICD-10-CM

## 2021-02-23 DIAGNOSIS — N1831 Chronic kidney disease, stage 3a: Secondary | ICD-10-CM

## 2021-02-23 LAB — BAYER DCA HB A1C WAIVED: HB A1C (BAYER DCA - WAIVED): 6.8 % — ABNORMAL HIGH (ref 4.8–5.6)

## 2021-02-23 NOTE — Progress Notes (Signed)
Subjective: CC: DM PCP: Janora Norlander, DO LFY:[id number] Michael Ramos is a 50 y.o. male presenting to clinic today for:  1. Type 2 Diabetes with hypertension, hyperlipidemia:  Compliant with Trijardy, glipizide, Pravachol and Coreg and Entresto.  No reports of chest pain, shortness of breath or fluid overload.  Blood sugars have been pretty good.  Mother really wants him to lose about 50 pounds.  She is tried Hotel manager with him that she given $50 if he lost 50 pounds.  Last eye exam: Up-to-date Last foot exam: Needs Last A1c:  Lab Results  Component Value Date   HGBA1C 6.8 (H) 02/23/2021   Nephropathy screen indicated?:  On ARB Last flu, zoster and/or pneumovax:  Immunization History  Administered Date(s) Administered   Influenza,inj,Quad PF,6+ Mos 01/18/2016, 01/10/2017, 01/31/2018, 02/09/2020   Influenza-Unspecified 01/23/2021   PFIZER(Purple Top)SARS-COV-2 Vaccination 07/17/2019, 08/08/2019, 02/29/2020   Pneumococcal Polysaccharide-23 01/10/2017   Tdap 02/09/2020    ROS: Per HPI  No Known Allergies Past Medical History:  Diagnosis Date   Allergy    Autism    CHF (congestive heart failure) ([place])    a. EF 20-25% by echo in 08/2016 b. repeat echo in 02/2017 showing EF 30-35% with cath showing normal cors.    Diabetes mellitus without complication (HCC)    Obesity (BMI 30-39.9) 10/23/2017    Current Outpatient Medications:    carvedilol (COREG) 25 MG tablet, TAKE ONE TABLET BY MOUTH TWICE DAILY, Disp: 180 tablet, Rfl: 3   diclofenac Sodium (VOLTAREN) 1 % GEL, Voltaren 1 % topical gel, Disp: , Rfl:    Empagliflozin-Linaglip-Metform (TRIJARDY XR) 25-08-998 MG TB24, Take 1 tablet by mouth daily. THE PREVIOUS DOSE WAS INCORRECT. Please replace, Disp: 30 tablet, Rfl: 12   ENTRESTO 49-51 MG, TAKE ONE TABLET BY MOUTH TWICE DAILY, Disp: 60 tablet, Rfl: 3   furosemide (LASIX) 20 MG tablet, TAKE ONE TABLET BY MOUTH DAILY AS NEEDED, Disp: 90 tablet, Rfl: 1   glipiZIDE (GLUCOTROL)  5 MG tablet, TAKE 1 TABLET TWICE DAILY BEFORE MEALS, Disp: 60 tablet, Rfl: 0   loratadine (CLARITIN) 10 MG tablet, TAKE ONE (1) TABLET EACH DAY, Disp: 90 tablet, Rfl: 3   pantoprazole (PROTONIX) 40 MG tablet, TAKE ONE (1) TABLET EACH DAY, Disp: 90 tablet, Rfl: 2   potassium chloride SA (KLOR-CON) 20 MEQ tablet, TAKE TWO TABLETS BY MOUTH DAILY, Disp: 180 tablet, Rfl: 2   pravastatin (PRAVACHOL) 10 MG tablet, TAKE ONE (1) TABLET EACH DAY, Disp: 30 tablet, Rfl: 5 Social History   Socioeconomic History   Marital status: Single    Spouse name: Not on file   Number of children: Not on file   Years of education: Not on file   Highest education level: 12th grade  Occupational History   Occupation: Disabled  Tobacco Use   Smoking status: Never   Smokeless tobacco: Never  Vaping Use   Vaping Use: Never used  Substance and Sexual Activity   Alcohol use: No   Drug use: No   Sexual activity: Not Currently  Other Topics Concern   Not on file  Social History Narrative   Autistic   Lives with Mother   Social Determinants of Health   Financial Resource Strain: Low Risk    Difficulty of Paying Living Expenses: Not hard at all  Food Insecurity: No Food Insecurity   Worried About Charity fundraiser in the Last Year: Never true   Ran Out of Food in the Last Year: Never true  Transportation  Needs: No Transportation Needs   Lack of Transportation (Medical): No   Lack of Transportation (Non-Medical): No  Physical Activity: Insufficiently Active   Days of Exercise per Week: 7 days   Minutes of Exercise per Session: 20 min  Stress: No Stress Concern Present   Feeling of Stress : Only a little  Social Connections: Socially Isolated   Frequency of Communication with Friends and Family: More than three times a week   Frequency of Social Gatherings with Friends and Family: More than three times a week   Attends Religious Services: Never   Marine scientist or Organizations: No   Attends  Music therapist: Never   Marital Status: Never married  Human resources officer Violence: Not At Risk   Fear of Current or Ex-Partner: No   Emotionally Abused: No   Physically Abused: No   Sexually Abused: No   Family History  Problem Relation Age of Onset   Diabetes Mother    Diabetes Father    Bladder Cancer Father    Colon cancer Neg Hx    Esophageal cancer Neg Hx    Liver cancer Neg Hx    Stomach cancer Neg Hx     Objective: Office vital signs reviewed. BP 124/83   Pulse 79   Temp (!) 97.5 F (36.4 C)   Ht '6\' 3"'  (1.905 m)   Wt 284 lb 9.6 oz (129.1 kg)   SpO2 94%   BMI 35.57 kg/m   Physical Examination:  General: Awake, alert, well nourished, No acute distress Cardio: regular rate and rhythm, S1S2 heard, no murmurs appreciated Pulm: clear to auscultation bilaterally, no wheezes, rhonchi or rales; normal work of breathing on room air Extremities: No evidence of fluid overload  Assessment/ Plan: 50 y.o. male   Type 2 diabetes mellitus with stage 3a chronic kidney disease, without long-term current use of insulin (HCC) - Plan: CMP14+EGFR, Lipid panel, Bayer DCA Hb A1c Waived, AMB Referral to Community Care Coordinaton  Hyperlipidemia associated with type 2 diabetes mellitus ([place])  Hypertension associated with diabetes ([place])  Non-ischemic cardiomyopathy ([place])  Sugar under good control.  His mother is interested in switching him to a GLP in efforts to promote some weight loss.  I think this is reasonable but would like her to have a more in-depth conversation about the risks and benefits since he is under such excellent control with current therapies.  I have referred him to CCM.  Would certainly need to come off of the Trijardy if they were to switch off of this on to GLP so as to reduce risk of pancreatitis.  Would really like to keep him on the Jardiance given heart history.  Could consider discontinuing the glipizide.  Not sure what is covered by Medicaid as  far as GLP's ago  Fasting lipid obtained.  Continue statin  Blood pressure controlled.  No changes    Orders Placed This Encounter  Procedures   CMP14+EGFR   Lipid panel   Bayer DCA Hb A1c Waived   No orders of the defined types were placed in this encounter.    Janora Norlander, DO [place] [phone]

## 2021-02-24 ENCOUNTER — Telehealth: Payer: Self-pay

## 2021-02-24 LAB — LIPID PANEL
Chol/HDL Ratio: 3.5 ratio (ref 0.0–5.0)
Cholesterol, Total: 129 mg/dL (ref 100–199)
HDL: 37 mg/dL — ABNORMAL LOW (ref >39)
LDL Chol Calc (NIH): 60 mg/dL (ref 0–99)
Triglycerides: 196 mg/dL — ABNORMAL HIGH (ref 0–149)
VLDL Cholesterol Cal: 32 mg/dL (ref 5–40)

## 2021-02-24 LAB — CMP14+EGFR
ALT: 21 IU/L (ref 0–44)
AST: 17 IU/L (ref 0–40)
Albumin/Globulin Ratio: 1.4 (ref 1.2–2.2)
Albumin: 3.9 g/dL — ABNORMAL LOW (ref 4.0–5.0)
Alkaline Phosphatase: 35 IU/L — ABNORMAL LOW (ref 44–121)
BUN/Creatinine Ratio: 10 (ref 9–20)
BUN: 16 mg/dL (ref 6–24)
Bilirubin Total: 0.3 mg/dL (ref 0.0–1.2)
CO2: 25 mmol/L (ref 20–29)
Calcium: 8.8 mg/dL (ref 8.7–10.2)
Chloride: 104 mmol/L (ref 96–106)
Creatinine, Ser: 1.61 mg/dL — ABNORMAL HIGH (ref 0.76–1.27)
Globulin, Total: 2.7 g/dL (ref 1.5–4.5)
Glucose: 133 mg/dL — ABNORMAL HIGH (ref 70–99)
Potassium: 4.7 mmol/L (ref 3.5–5.2)
Sodium: 143 mmol/L (ref 134–144)
Total Protein: 6.6 g/dL (ref 6.0–8.5)
eGFR: 52 mL/min/{1.73_m2} — ABNORMAL LOW (ref >59)

## 2021-02-24 NOTE — Chronic Care Management (AMB) (Signed)
  Chronic Care Management   Note  02/24/2021 Name: THADIUS SMISEK MRN: [id number] DOB: 1970/11/15  [name] is a 50 y.o. year old male who is a primary care patient of Janora Norlander, DO. I reached out to [name] by phone today in response to a referral sent by Mr. Havier Deeb Perusse's PCP.  Mr. Corkum was given information about Chronic Care Management services today including:  CCM service includes personalized support from designated clinical staff supervised by his physician, including individualized plan of care and coordination with other care providers 24/7 contact phone numbers for assistance for urgent and routine care needs. Service will only be billed when office clinical staff spend 20 minutes or more in a month to coordinate care. Only one practitioner may furnish and bill the service in a calendar month. The patient may stop CCM services at any time (effective at the end of the month) by phone call to the office staff. The patient is responsible for co-pay (up to 20% after annual deductible is met) if co-pay is required by the individual health plan.   Patient agreed to services and verbal consent obtained.   Follow up plan: Telephone appointment with care management team member scheduled for:02/25/2021  Noreene Larsson, [place], [place], [place] [address] Direct Dial: [phone] [email or web address] Website: [email or web address]

## 2021-02-25 ENCOUNTER — Ambulatory Visit (INDEPENDENT_AMBULATORY_CARE_PROVIDER_SITE_OTHER): Payer: Medicare Other | Admitting: Pharmacist

## 2021-02-25 ENCOUNTER — Other Ambulatory Visit: Payer: Self-pay

## 2021-02-25 DIAGNOSIS — E785 Hyperlipidemia, unspecified: Secondary | ICD-10-CM

## 2021-02-25 DIAGNOSIS — E119 Type 2 diabetes mellitus without complications: Secondary | ICD-10-CM

## 2021-02-25 MED ORDER — RYBELSUS 7 MG PO TABS: 7.0000 mg | ORAL_TABLET | Freq: Every day | ORAL | 3 refills | Status: DC

## 2021-02-25 MED ORDER — SYNJARDY XR 25-1000 MG PO TB24: 1.0000 | ORAL_TABLET | Freq: Every day | ORAL | 3 refills | Status: DC

## 2021-02-25 NOTE — Progress Notes (Signed)
Chronic Care Management Pharmacy Note  02/25/2021 Name:  Michael Ramos MRN:  [id number] DOB:  1971/01/13  Summary: T2DM  Recommendations/Changes made from today's visit: Diabetes: New goal. Uncontrolled; current treatment: TRIJARDY XR-->RYBELSUS + SYNJARDY XR;  Patient still not at goal Will introduce GLP1 in place of DPP4 Denies personal and family history of Medullary thyroid cancer (MTC) Patient's caregivers & patient agreed on oral GLP1 option (rybelsus) STOP Trijardy START Rybelsus 90m daily Counseled patient & caregivers to give in the AM with 4 oz water, 30 min before eating, drinking, other meds Sample pack of 370mdaily given Will increase to 9m62maily thereafter START Synjardy XR Current glucose readings: fasting glucose: <165, post prandial glucose: <200 Denies  hypoglycemic/hyperglycemic symptoms Discussed meal planning options and Plate method for healthy eating Avoid sugary drinks and desserts Incorporate balanced protein, non starchy veggies, 1 serving of carbohydrate with each meal Increase water intake Increase physical activity as able Current exercise: n/a Recommended oral GLP vs DPP4 Collaborated with PCP  Patient Goals/Self-Care Activities patient will:  - take medications as prescribed as evidenced by patient report and record review check glucose DAILY, document, and provide at future appointments engage in dietary modifications by following HEART HEALTHY diet  Plan: F/U IN 03/2021  Subjective: Michael Ramos an 50 21o. year old male who is a primary patient of GotJanora NorlanderO.  The CCM team was consulted for assistance with disease management and care coordination needs.    Engaged with patient face to face for initial visit in response to provider referral for pharmacy case management and/or care coordination services.   Consent to Services:  The patient was given information about Chronic Care Management services, agreed to  services, and gave verbal consent prior to initiation of services.  Please see initial visit note for detailed documentation.   Patient Care Team: GotJanora NorlanderO as PCP - General (Family Medicine) BraHarl BowieonAlphonse GuildD as PCP - Cardiology (Cardiology) Pru[facility]vera GuisePHP[facility] Pharmacist (Family Medicine)  Objective:  Lab Results  Component Value Date   CREATININE 1.61 (H) 02/23/2021   CREATININE 1.45 (H) 11/19/2020   CREATININE 1.58 (H) 08/09/2020    Lab Results  Component Value Date   HGBA1C 6.8 (H) 02/23/2021   Last diabetic Eye exam:  Lab Results  Component Value Date/Time   HMDIABEYEEXA No Retinopathy 07/02/2020 12:00 AM    Last diabetic Foot exam: No results found for: HMDIABFOOTEX      Component Value Date/Time   CHOL 129 02/23/2021 1025   TRIG 196 (H) 02/23/2021 1025   HDL 37 (L) 02/23/2021 1025   CHOLHDL 3.5 02/23/2021 1025   LDLCALC 60 02/23/2021 1025    Hepatic Function Latest Ref Rng & Units 02/23/2021 11/19/2020 08/09/2020  Total Protein 6.0 - 8.5 g/dL 6.6 - -  Albumin 4.0 - 5.0 g/dL 3.9(L) 4.0 4.3  AST 0 - 40 IU/L 17 - -  ALT 0 - 44 IU/L 21 - -  Alk Phosphatase 44 - 121 IU/L 35(L) - -  Total Bilirubin 0.0 - 1.2 mg/dL 0.3 - -  Bilirubin, Direct 0.1 - 0.5 mg/dL - - -    Lab Results  Component Value Date/Time   TSH 1.450 04/29/2019 04:36 PM   TSH 2.230 11/15/2018 09:48 AM    CBC Latest Ref Rng & Units 05/11/2020 04/29/2019 11/15/2018  WBC 3.4 - 10.8 x10E3/uL 7.4 7.1 8.5  Hemoglobin 13.0 - 17.7 g/dL 14.5 14.8 13.9  Hematocrit  37.5 - 51.0 % 47.7 44.2 41.6  Platelets 150 - 450 x10E3/uL 197 216 216    Lab Results  Component Value Date/Time   VD25OH 31.7 08/09/2020 09:43 AM   VD25OH 13.5 (L) 05/11/2020 11:04 AM    Clinical ASCVD: No  The ASCVD Risk score (Arnett DK, et al., 2019) failed to calculate for the following reasons:   The valid total cholesterol range is 130 to 320 mg/dL    Other: (CHADS2VASc if Afib, PHQ9 if depression, MMRC or CAT  for COPD, ACT, DEXA)  Social History   Tobacco Use  Smoking Status Never  Smokeless Tobacco Never   BP Readings from Last 3 Encounters:  02/23/21 124/83  11/19/20 119/81  11/16/20 126/72   Pulse Readings from Last 3 Encounters:  02/23/21 79  11/19/20 84  11/16/20 85   Wt Readings from Last 3 Encounters:  02/23/21 284 lb 9.6 oz (129.1 kg)  02/04/21 292 lb (132.5 kg)  11/19/20 286 lb 9.6 oz (130 kg)    Assessment: Review of patient past medical history, allergies, medications, health status, including review of consultants reports, laboratory and other test data, was performed as part of comprehensive evaluation and provision of chronic care management services.   SDOH:  (Social Determinants of Health) assessments and interventions performed:    CCM Care Plan  No Known Allergies  Medications Reviewed Today     Reviewed by Lavera Guise, [facility] (Pharmacist) on 02/25/21 at 1200  Med List Status: <None>   Medication Order Taking? Sig Documenting Provider Last Dose Status Informant  carvedilol (COREG) 25 MG tablet [id number] No TAKE ONE TABLET BY MOUTH TWICE DAILY Branch, Alphonse Guild, MD Taking Active   diclofenac Sodium (VOLTAREN) 1 % GEL [id number] No Voltaren 1 % topical gel [provider] Taking Active   Empagliflozin-metFORMIN HCl ER (SYNJARDY XR) 25-1000 MG TB24 [id number] Yes Take 1 tablet by mouth daily. Stop trijardy Halford Che[place]man  Active   ENTRESTO 49-51 MG [id number] No TAKE ONE TABLET BY MOUTH TWICE DAILY Branch, Alphonse Guild, MD Taking Active   furosemide (LASIX) 20 MG tablet [id number] No TAKE ONE TABLET BY MOUTH DAILY AS NEEDED Arnoldo Lenis, MD Taking Active   glipiZIDE (GLUCOTROL) 5 MG tablet [id number] No TAKE 1 TABLET TWICE DAILY BEFORE MEALS Ronnie Doss M, DO Taking Active   loratadine (CLARITIN) 10 MG tablet [id number] No TAKE ONE (1) TABLET EACH DAY [place], [place], [place] Taking Active   pantoprazole (PROTONIX) 40 MG tablet [id number]  No TAKE ONE (1) TABLET EACH DAY [place], [place], DO Taking Active   potassium chloride SA (KLOR-CON) 20 MEQ tablet [id number] No TAKE TWO TABLETS BY MOUTH DAILY Arnoldo Lenis, MD Taking Active   pravastatin (PRAVACHOL) 10 MG tablet [id number] No TAKE ONE (1) TABLET EACH DAY [place], [place], DO Taking Active   Semaglutide (RYBELSUS) 7 MG TABS [id number] Yes Take 7 mg by mouth daily. Start after 33m sample pack GRonnie DossM, [place] Active             Patient Active Problem List   Diagnosis Date Noted   Hyperlipidemia associated with type 2 diabetes mellitus ([place] 10/31/2019   Congestive heart failure ([place] 12/22/2017   Low back pain 12/22/2017   Pain of left hip joint 12/22/2017   Obesity (BMI 30-39.9) 069/48/5462  Chronic systolic heart failure ([place]    Non-ischemic cardiomyopathy ([place]    Dilated cardiomyopathy ([place] 08/28/2016   Acute on chronic renal insufficiency 08/28/2016  Essential hypertension 71/24/5809   Acute systolic CHF (congestive heart failure) ([place]) 08/18/2016   Shortness of breath 08/15/2016   Type 2 diabetes mellitus without complication, without long-term current use of insulin ([place]) 03/14/2016   Chronic nonseasonal allergic rhinitis due to pollen 03/14/2016   Autism 03/14/2016    Immunization History  Administered Date(s) Administered   Influenza,inj,Quad PF,6+ Mos 01/18/2016, 01/10/2017, 01/31/2018, 02/09/2020   Influenza-Unspecified 01/23/2021   PFIZER(Purple Top)SARS-COV-2 Vaccination 07/17/2019, 08/08/2019, 02/29/2020   Pneumococcal Polysaccharide-23 01/10/2017   Tdap 02/09/2020    Conditions to be addressed/monitored: DMII  Care Plan : PHARMD MEDICATION MANAGEMENT  Updates made by Lavera Guise, [place] since 03/06/2021 12:00 AM     Problem: DISEASE PROGRESSION PREVENTION      Long-Range Goal: T2DM   This Visit's Progress: Not on track  Priority: High  Note:   Current Barriers:  Unable to achieve control of T2DM  Suboptimal  therapeutic regimen for T2DM  Pharmacist Clinical Goal(s):  patient will achieve & maintain control of T2DM as evidenced by IMPROVED GLYCEMIC CONTROL through collaboration with PharmD and provider.   Interventions: 1:1 collaboration with Janora Norlander, DO regarding development and update of comprehensive plan of care as evidenced by provider attestation and co-signature Inter-disciplinary care team collaboration (see longitudinal plan of care) Comprehensive medication review performed; medication list updated in electronic medical record  Diabetes: New goal. Uncontrolled; current treatment: TRIJARDY XR;  Patient still not at goal Will introduce GLP1 in place of DPP4 Denies personal and family history of Medullary thyroid cancer (MTC) Patient's caregivers & patient agreed on oral GLP1 option (rybelsus) STOP Trijardy START Rybelsus 72m daily Counseled patient & caregivers to give in the AM with 4 oz water, 30 min before eating, drinking, other meds Sample pack of 344mdaily given Will increase to 88m42maily thereafter START Synjardy XR Current glucose readings: fasting glucose: <165, post prandial glucose: <200 Denies  hypoglycemic/hyperglycemic symptoms Discussed meal planning options and Plate method for healthy eating Avoid sugary drinks and desserts Incorporate balanced protein, non starchy veggies, 1 serving of carbohydrate with each meal Increase water intake Increase physical activity as able Current exercise: n/a Recommended oral GLP vs DPP4 Collaborated with PCP   Patient Goals/Self-Care Activities patient will:  - take medications as prescribed as evidenced by patient report and record review check glucose DAILY, document, and provide at future appointments engage in dietary modifications by following HEART HEALTHY diet--WILL ALSO BE BENEFICIAL FOR CHOLESTEROL/HEART HEALTH      Medication Assistance: None required.  Patient affirms current coverage meets  needs.  Patient's preferred pharmacy is:  [facility] [address] [address] [place]one: [phone]: [phone] [place]one: [phone]: [phone] pill box? No - MOM & SISTER ASSIST WITH CARE-THEY PROVIDE PATIENT WITH EXCELLENT CARE CAREGIVERS endorses 100% compliance  Follow Up:  Patient agrees to Care Plan and Follow-up.  Plan: Telephone follow up appointment with care management team member scheduled for:  03/25/21   JulRegina EckharmD, BCPS Clinical Pharmacist, [place] Phone [phone]

## 2021-03-06 NOTE — Patient Instructions (Signed)
Visit Information  Thank you for taking time to visit with me today. Please don't hesitate to contact me if I can be of assistance to you before our next scheduled telephone appointment.  Following are the goals we discussed today:  Current Barriers:  Unable to achieve control of T2DM  Suboptimal therapeutic regimen for T2DM  Pharmacist Clinical Goal(s):  patient will achieve & maintain control of T2DM as evidenced by IMPROVED GLYCEMIC CONTROL through collaboration with PharmD and provider.   Interventions: 1:1 collaboration with Janora Norlander, DO regarding development and update of comprehensive plan of care as evidenced by provider attestation and co-signature Inter-disciplinary care team collaboration (see longitudinal plan of care) Comprehensive medication review performed; medication list updated in electronic medical record  Diabetes: New goal. Uncontrolled; current treatment: TRIJARDY XR;  Patient still not at goal Will introduce GLP1 in place of DPP4 Denies personal and family history of Medullary thyroid cancer (MTC) Patient's caregivers & patient agreed on oral GLP1 option (rybelsus) STOP Trijardy START Rybelsus 3mg  daily Counseled patient & caregivers to give in the AM with 4 oz water, 30 min before eating, drinking, other meds Sample pack of 3mg  daily given Will increase to 7mg  daily thereafter START Synjardy XR Current glucose readings: fasting glucose: <165, post prandial glucose: <200 Denies  hypoglycemic/hyperglycemic symptoms Discussed meal planning options and Plate method for healthy eating Avoid sugary drinks and desserts Incorporate balanced protein, non starchy veggies, 1 serving of carbohydrate with each meal Increase water intake Increase physical activity as able Current exercise: n/a Recommended oral GLP vs DPP4 Collaborated with PCP  Patient Goals/Self-Care Activities patient will:  - take medications as prescribed as evidenced by patient  report and record review check glucose DAILY, document, and provide at future appointments engage in dietary modifications by following HEART HEALTHY diet   Please call the care guide team at [phone] if you need to cancel or reschedule your appointment.   The patient verbalized understanding of instructions, educational materials, and care plan provided today and declined offer to receive copy of patient instructions, educational materials, and care plan.   Signature Regina Eck, PharmD, BCPS Clinical Pharmacist, [place]  II Phone [phone]

## 2021-03-25 ENCOUNTER — Ambulatory Visit (INDEPENDENT_AMBULATORY_CARE_PROVIDER_SITE_OTHER): Payer: Medicare Other | Admitting: Pharmacist

## 2021-03-25 DIAGNOSIS — E785 Hyperlipidemia, unspecified: Secondary | ICD-10-CM

## 2021-03-25 DIAGNOSIS — E119 Type 2 diabetes mellitus without complications: Secondary | ICD-10-CM

## 2021-03-25 NOTE — Progress Notes (Signed)
Chronic Care Management Pharmacy Note  03/25/2021 Name:  Michael Ramos MRN:  [id number] DOB:  Nov 11, 1970  Summary: T2DM, HLD  Recommendations/Changes made from today's visit:  Diabetes: New goal. Uncontrolled; current treatment: RYBELSUS, SYNJARDY XR;  Patient still not at goal--patient's mom and sister assist patient with his care Will introduce GLP1 in place of DPP4 Denies personal and family history of Medullary thyroid cancer (MTC) Patient's caregivers & patient agreed on oral GLP1 option (rybelsus) STOP Trijardy INCREASE TO Rybelsus 4m daily Counseled patient & caregivers to give in the AM with 4 oz water, 30 min before eating, drinking, other meds CONTINUE Synjardy XR Current glucose readings: fasting glucose: 100s, post prandial glucose: <200 Denies  hypoglycemic/hyperglycemic symptoms Discussed meal planning options and Plate method for healthy eating Avoid sugary drinks and desserts Incorporate balanced protein, non starchy veggies, 1 serving of carbohydrate with each meal Increase water intake Increase physical activity as able Current exercise: n/a Recommended oral GLP vs DPP4 Collaborated with PCP   Patient Goals/Self-Care Activities patient will:  - take medications as prescribed as evidenced by patient report and record review check glucose DAILY, document, and provide at future appointments engage in dietary modifications by following HEART HEALTHY diet--WILL ALSO BE BENEFICIAL FOR CHOLESTEROL/HEART HEALTHY   Subjective: Michael HAGINis an 50y.o. year old male who is a primary patient of GJanora Norlander DO.  The CCM team was consulted for assistance with disease management and care coordination needs.    Engaged with patient by telephone for follow up visit in response to provider referral for pharmacy case management and/or care coordination services.   Consent to Services:  The patient was given information about Chronic Care Management  services, agreed to services, and gave verbal consent prior to initiation of services.  Please see initial visit note for detailed documentation.   Patient Care Team: GJanora Norlander DO as PCP - General (Family Medicine) BHarl Bowie JAlphonse Guild MD as PCP - Cardiology (Cardiology) PLavera Guise RSal[facility] Surgery Centeras Pharmacist (Family Medicine)  Objective:  Lab Results  Component Value Date   CREATININE 1.61 (H) 02/23/2021   CREATININE 1.45 (H) 11/19/2020   CREATININE 1.58 (H) 08/09/2020    Lab Results  Component Value Date   HGBA1C 6.8 (H) 02/23/2021   Last diabetic Eye exam:  Lab Results  Component Value Date/Time   HMDIABEYEEXA No Retinopathy 07/02/2020 12:00 AM    Last diabetic Foot exam: No results found for: HMDIABFOOTEX      Component Value Date/Time   CHOL 129 02/23/2021 1025   TRIG 196 (H) 02/23/2021 1025   HDL 37 (L) 02/23/2021 1025   CHOLHDL 3.5 02/23/2021 1025   LDLCALC 60 02/23/2021 1025    Hepatic Function Latest Ref Rng & Units 02/23/2021 11/19/2020 08/09/2020  Total Protein 6.0 - 8.5 g/dL 6.6 - -  Albumin 4.0 - 5.0 g/dL 3.9(L) 4.0 4.3  AST 0 - 40 IU/L 17 - -  ALT 0 - 44 IU/L 21 - -  Alk Phosphatase 44 - 121 IU/L 35(L) - -  Total Bilirubin 0.0 - 1.2 mg/dL 0.3 - -  Bilirubin, Direct 0.1 - 0.5 mg/dL - - -    Lab Results  Component Value Date/Time   TSH 1.450 04/29/2019 04:36 PM   TSH 2.230 11/15/2018 09:48 AM    CBC Latest Ref Rng & Units 05/11/2020 04/29/2019 11/15/2018  WBC 3.4 - 10.8 x10E3/uL 7.4 7.1 8.5  Hemoglobin 13.0 - 17.7 g/dL 14.5 14.8 13.9  Hematocrit  37.5 - 51.0 % 47.7 44.2 41.6  Platelets 150 - 450 x10E3/uL 197 216 216    Lab Results  Component Value Date/Time   VD25OH 31.7 08/09/2020 09:43 AM   VD25OH 13.5 (L) 05/11/2020 11:04 AM    Clinical ASCVD: No  The ASCVD Risk score (Arnett DK, et al., 2019) failed to calculate for the following reasons:   The valid total cholesterol range is 130 to 320 mg/dL    Other: (CHADS2VASc if Afib, PHQ9 if  depression, MMRC or CAT for COPD, ACT, DEXA)  Social History   Tobacco Use  Smoking Status Never  Smokeless Tobacco Never   BP Readings from Last 3 Encounters:  02/23/21 124/83  11/19/20 119/81  11/16/20 126/72   Pulse Readings from Last 3 Encounters:  02/23/21 79  11/19/20 84  11/16/20 85   Wt Readings from Last 3 Encounters:  02/23/21 284 lb 9.6 oz (129.1 kg)  02/04/21 292 lb (132.5 kg)  11/19/20 286 lb 9.6 oz (130 kg)    Assessment: Review of patient past medical history, allergies, medications, health status, including review of consultants reports, laboratory and other test data, was performed as part of comprehensive evaluation and provision of chronic care management services.   SDOH:  (Social Determinants of Health) assessments and interventions performed:    CCM Care Plan  No Known Allergies  Medications Reviewed Today     Reviewed by Lavera Guise, [facility] (Pharmacist) on 03/30/21 at [place] List Status: <None>   Medication Order Taking? Sig Documenting Provider Last Dose Status Informant  carvedilol (COREG) 25 MG tablet [id number]  TAKE ONE TABLET BY MOUTH TWICE DAILY Branch, Alphonse Guild, MD  Active   diclofenac Sodium (VOLTAREN) 1 % GEL [id number] No Voltaren 1 % topical gel [provider] Taking Active   Empagliflozin-metFORMIN HCl ER (SYNJARDY XR) 25-1000 MG TB24 [id number]  Take 1 tablet by mouth daily. Stop trijardy Halford Chessman  Active   ENTRESTO 49-51 MG [id number] No TAKE ONE TABLET BY MOUTH TWICE DAILY Branch, Alphonse Guild, MD Taking Active   furosemide (LASIX) 20 MG tablet [id number] No TAKE ONE TABLET BY MOUTH DAILY AS NEEDED Arnoldo Lenis, MD Taking Active   glipiZIDE (GLUCOTROL) 5 MG tablet [id number] No TAKE 1 TABLET TWICE DAILY BEFORE MEALS Ronnie Doss M, DO Taking Active   loratadine (CLARITIN) 10 MG tablet [id number] No TAKE ONE (1) TABLET EACH DAY [place], [place], [place] Taking Active   pantoprazole (PROTONIX) 40 MG  tablet [id number] No TAKE ONE (1) TABLET EACH DAY [place], King, DO Taking Active   potassium chloride SA (KLOR-CON) 20 MEQ tablet [id number] No TAKE TWO TABLETS BY MOUTH DAILY Arnoldo Lenis, MD Taking Active   pravastatin (PRAVACHOL) 10 MG tablet [id number] No TAKE ONE (1) TABLET EACH DAY Gottschalk, [place], DO Taking Active   Semaglutide (RYBELSUS) 7 MG TABS [id number]  Take 7 mg by mouth daily. Start after 58m sample pack GRonnie DossM, [place] Active             Patient Active Problem List   Diagnosis Date Noted   Hyperlipidemia associated with type 2 diabetes mellitus ([place] 10/31/2019   Congestive heart failure ([place] 12/22/2017   Low back pain 12/22/2017   Pain of left hip joint 12/22/2017   Obesity (BMI 30-39.9) 023/55/7322  Chronic systolic heart failure ([place]    Non-ischemic cardiomyopathy ([place]    Dilated cardiomyopathy ([place] 08/28/2016   Acute on chronic renal insufficiency 08/28/2016  Essential hypertension 28/20/6015   Acute systolic CHF (congestive heart failure) ([place]) 08/18/2016   Shortness of breath 08/15/2016   Type 2 diabetes mellitus without complication, without long-term current use of insulin ([place]) 03/14/2016   Chronic nonseasonal allergic rhinitis due to pollen 03/14/2016   Autism 03/14/2016    Immunization History  Administered Date(s) Administered   Influenza,inj,Quad PF,6+ Mos 01/18/2016, 01/10/2017, 01/31/2018, 02/09/2020   Influenza-Unspecified 01/23/2021   PFIZER(Purple Top)SARS-COV-2 Vaccination 07/17/2019, 08/08/2019, 02/29/2020   Pneumococcal Polysaccharide-23 01/10/2017   Tdap 02/09/2020    Conditions to be addressed/monitored: HLD and DMII  Care Plan : PHARMD MEDICATION MANAGEMENT  Updates made by Lavera Guise, [place] since 03/30/2021 12:00 AM     Problem: DISEASE PROGRESSION PREVENTION      Long-Range Goal: T2DM   Recent Progress: Not on track  Priority: High  Note:   Current Barriers:  Unable to achieve control of  T2DM  Suboptimal therapeutic regimen for T2DM  Pharmacist Clinical Goal(s):  patient will achieve & maintain control of T2DM as evidenced by IMPROVED GLYCEMIC CONTROL through collaboration with PharmD and provider.   Interventions: 1:1 collaboration with Janora Norlander, DO regarding development and update of comprehensive plan of care as evidenced by provider attestation and co-signature Inter-disciplinary care team collaboration (see longitudinal plan of care) Comprehensive medication review performed; medication list updated in electronic medical record  Diabetes: New goal. Uncontrolled; current treatment: RYBELSUS, SYNJARDY XR;  Patient still not at goal Will introduce GLP1 in place of DPP4 Denies personal and family history of Medullary thyroid cancer (MTC) Patient's caregivers & patient agreed on oral GLP1 option (rybelsus) STOP Trijardy INCREASE TO Rybelsus 49m daily Counseled patient & caregivers to give in the AM with 4 oz water, 30 min before eating, drinking, other meds CONTINUE Synjardy XR Current glucose readings: fasting glucose: <165, post prandial glucose: <200 Denies  hypoglycemic/hyperglycemic symptoms Discussed meal planning options and Plate method for healthy eating Avoid sugary drinks and desserts Incorporate balanced protein, non starchy veggies, 1 serving of carbohydrate with each meal Increase water intake Increase physical activity as able Current exercise: n/a Recommended oral GLP vs DPP4 Collaborated with PCP   Patient Goals/Self-Care Activities patient will:  - take medications as prescribed as evidenced by patient report and record review check glucose DAILY, document, and provide at future appointments engage in dietary modifications by following HEART HEALTHY diet--WILL ALSO BE BENEFICIAL FOR CHOLESTEROL/HEART HEALTHY      Medication Assistance: None required.  Patient affirms current coverage meets needs.  Patient's [place]  [place]:  [place]Phone: [phone]: [phone] [facility] [place] [address] [phone] [place]Phone: [phone]: [phone]  Follow Up:  Patient agrees to Care Plan and Follow-up.  Plan: Telephone follow up appointment with care management team member scheduled for:  05/06/21   JRegina Eck PharmD, BCPS Clinical Pharmacist, [place] Phone [phone]

## 2021-03-28 ENCOUNTER — Other Ambulatory Visit: Payer: Self-pay | Admitting: Cardiology

## 2021-03-30 NOTE — Patient Instructions (Signed)
Visit Information  Thank you for taking time to visit with me today. Please don't hesitate to contact me if I can be of assistance to you before our next scheduled telephone appointment.  Following are the goals we discussed today:  Current Barriers:  Unable to achieve control of T2DM  Suboptimal therapeutic regimen for T2DM  Pharmacist Clinical Goal(s):  patient will achieve & maintain control of T2DM as evidenced by IMPROVED GLYCEMIC CONTROL through collaboration with PharmD and provider.   Interventions: 1:1 collaboration with Janora Norlander, DO regarding development and update of comprehensive plan of care as evidenced by provider attestation and co-signature Inter-disciplinary care team collaboration (see longitudinal plan of care) Comprehensive medication review performed; medication list updated in electronic medical record  Diabetes: New goal. Uncontrolled; current treatment: RYBELSUS, SYNJARDY XR;  Patient still not at goal Will introduce GLP1 in place of DPP4 Denies personal and family history of Medullary thyroid cancer (MTC) Patient's caregivers & patient agreed on oral GLP1 option (rybelsus) STOP Trijardy INCREASE TO Rybelsus 7mg  daily Counseled patient & caregivers to give in the AM with 4 oz water, 30 min before eating, drinking, other meds CONTINUE Synjardy XR Current glucose readings: fasting glucose: <165, post prandial glucose: <200 Denies  hypoglycemic/hyperglycemic symptoms Discussed meal planning options and Plate method for healthy eating Avoid sugary drinks and desserts Incorporate balanced protein, non starchy veggies, 1 serving of carbohydrate with each meal Increase water intake Increase physical activity as able Current exercise: n/a Recommended oral GLP vs DPP4 Collaborated with PCP   Patient Goals/Self-Care Activities patient will:  - take medications as prescribed as evidenced by patient report and record review check glucose DAILY,  document, and provide at future appointments engage in dietary modifications by following HEART HEALTHY diet--WILL ALSO BE BENEFICIAL FOR CHOLESTEROL/HEART HEALTHY   Please call the care guide team at [phone] if you need to cancel or reschedule your appointment.  The patient verbalized understanding of instructions, educational materials, and care plan provided today and declined offer to receive copy of patient instructions, educational materials, and care plan.    Regina Eck, PharmD, BCPS Clinical Pharmacist, [place]  II Phone [phone]

## 2021-04-05 ENCOUNTER — Telehealth: Payer: Self-pay | Admitting: Family Medicine

## 2021-04-05 MED ORDER — RYBELSUS 3 MG PO TABS: 3.0000 mg | ORAL_TABLET | Freq: Every day | ORAL | 2 refills | Status: DC

## 2021-04-05 NOTE — Telephone Encounter (Signed)
Ok to send the 3mg .

## 2021-04-05 NOTE — Telephone Encounter (Signed)
Pt was started on Rybelsus 3mg  02/25/2021 and then the dose was increased to 7mg  03/25/2021. Mom states he has terrible smelling "burps" and is having loose stools daily. She states he has had 3 episodes of dizziness since the increased dose but she did not check his blood sugars when the dizziness occurred. She states pt is hydrating well and eating a "healthy" diet. She did hold the rybelsus yesterday. Please advise.

## 2021-04-05 NOTE — Telephone Encounter (Signed)
Rx sent to pharmacy and pt's mother is aware.

## 2021-04-05 NOTE — Telephone Encounter (Signed)
Pt's mom is aware of provider feedback and states that pt is refusing to take the 7mg  any further. Should I send in 3mg  rx to pharmacy as pt was originally given samples or wait for Almyra Free to look at it as well.

## 2021-04-05 NOTE — Telephone Encounter (Signed)
Loose stools and sulfur burps are VERY common with this class of medication.  If he was doing better at the 3mg , we could consider reducing back to that dose.  Will cc Almyra Free as well.

## 2021-04-11 ENCOUNTER — Other Ambulatory Visit: Payer: Self-pay | Admitting: Family Medicine

## 2021-04-11 DIAGNOSIS — J3089 Other allergic rhinitis: Secondary | ICD-10-CM

## 2021-04-18 ENCOUNTER — Ambulatory Visit (INDEPENDENT_AMBULATORY_CARE_PROVIDER_SITE_OTHER): Payer: Medicare Other | Admitting: Family Medicine

## 2021-04-18 ENCOUNTER — Encounter: Payer: Self-pay | Admitting: Family Medicine

## 2021-04-18 VITALS — BP 125/70 | HR 86 | Temp 97.8°F | Ht 75.0 in | Wt 279.0 lb

## 2021-04-18 DIAGNOSIS — E1122 Type 2 diabetes mellitus with diabetic chronic kidney disease: Secondary | ICD-10-CM

## 2021-04-18 DIAGNOSIS — R42 Dizziness and giddiness: Secondary | ICD-10-CM

## 2021-04-18 DIAGNOSIS — N1831 Chronic kidney disease, stage 3a: Secondary | ICD-10-CM

## 2021-04-18 MED ORDER — TRIJARDY XR 25-5-1000 MG PO TB24: 1.0000 | ORAL_TABLET | Freq: Every day | ORAL | 12 refills | Status: DC

## 2021-04-18 NOTE — Patient Instructions (Signed)
STOP Rybelsus!  ALL DOSES!  STOP Synjardy XR  START TRIJARDY XR

## 2021-04-18 NOTE — Progress Notes (Signed)
Subjective: CC: Dizziness PCP: Janora Norlander, DO ONG:EXBMWUX L Crum is a 51 y.o. male presenting to clinic today for:  1.  Dizziness Patient is accompanied by his sister and mother today.  They report that his dizziness seems to have onset with the advancement of Rybelsus to 7 mg.  He had done well on 3 mg up until that point.  The dizziness was reportedly intermittent.  No reports of falls with medicine but there is concerned that the medication may be causing.  During these episodes of dizziness, blood pressure has been normal and blood sugar has been normal.   ROS: Per HPI  No Known Allergies Past Medical History:  Diagnosis Date   Allergy    Autism    CHF (congestive heart failure) ([place])    a. EF 20-25% by echo in 08/2016 b. repeat echo in 02/2017 showing EF 30-35% with cath showing normal cors.    Diabetes mellitus without complication (HCC)    Obesity (BMI 30-39.9) 10/23/2017    Current Outpatient Medications:    carvedilol (COREG) 25 MG tablet, TAKE ONE TABLET BY MOUTH TWICE DAILY, Disp: 180 tablet, Rfl: 3   diclofenac Sodium (VOLTAREN) 1 % GEL, Voltaren 1 % topical gel, Disp: , Rfl:    Empagliflozin-metFORMIN HCl ER (SYNJARDY XR) 25-1000 MG TB24, Take 1 tablet by mouth daily. Stop trijardy, Disp: 90 tablet, Rfl: 3   ENTRESTO 49-51 MG, TAKE ONE TABLET BY MOUTH TWICE DAILY, Disp: 60 tablet, Rfl: 3   furosemide (LASIX) 20 MG tablet, TAKE ONE TABLET BY MOUTH DAILY AS NEEDED, Disp: 90 tablet, Rfl: 1   glipiZIDE (GLUCOTROL) 5 MG tablet, TAKE 1 TABLET TWICE DAILY BEFORE MEALS, Disp: 60 tablet, Rfl: 0   loratadine (CLARITIN) 10 MG tablet, TAKE ONE (1) TABLET EACH DAY, Disp: 90 tablet, Rfl: 3   pantoprazole (PROTONIX) 40 MG tablet, TAKE ONE (1) TABLET EACH DAY, Disp: 90 tablet, Rfl: 2   potassium chloride SA (KLOR-CON) 20 MEQ tablet, TAKE TWO TABLETS BY MOUTH DAILY, Disp: 180 tablet, Rfl: 2   pravastatin (PRAVACHOL) 10 MG tablet, TAKE ONE (1) TABLET EACH DAY, Disp: 30  tablet, Rfl: 5   Semaglutide (RYBELSUS) 3 MG TABS, Take 3 mg by mouth daily., Disp: 30 tablet, Rfl: 2 Social History   Socioeconomic History   Marital status: Single    Spouse name: Not on file   Number of children: Not on file   Years of education: Not on file   Highest education level: 12th grade  Occupational History   Occupation: Disabled  Tobacco Use   Smoking status: Never   Smokeless tobacco: Never  Vaping Use   Vaping Use: Never used  Substance and Sexual Activity   Alcohol use: No   Drug use: No   Sexual activity: Not Currently  Other Topics Concern   Not on file  Social History Narrative   Autistic   Lives with Mother   Social Determinants of Health   Financial Resource Strain: Low Risk    Difficulty of Paying Living Expenses: Not hard at all  Food Insecurity: No Food Insecurity   Worried About Charity fundraiser in the Last Year: Never true   [place] in the Last Year: Never true  Transportation Needs: No Transportation Needs   Lack of Transportation (Medical): No   Lack of Transportation (Non-Medical): No  Physical Activity: Insufficiently Active   Days of Exercise per Week: 7 days   Minutes of Exercise per Session: 20  min  Stress: No Stress Concern Present   Feeling of Stress : Only a little  Social Connections: Socially Isolated   Frequency of Communication with Friends and Family: More than three times a week   Frequency of Social Gatherings with Friends and Family: More than three times a week   Attends Religious Services: Never   Marine scientist or Organizations: No   Attends Music therapist: Never   Marital Status: Never married  Human resources officer Violence: Not At Risk   Fear of Current or Ex-Partner: No   Emotionally Abused: No   Physically Abused: No   Sexually Abused: No   Family History  Problem Relation Age of Onset   Diabetes Mother    Diabetes Father    Bladder Cancer Father    Colon cancer Neg Hx     Esophageal cancer Neg Hx    Liver cancer Neg Hx    Stomach cancer Neg Hx     Objective: Office vital signs reviewed. BP 125/70    Pulse 86    Temp 97.8 F (36.6 C)    Ht 6\' 3"  (1.905 m)    Wt 279 lb (126.6 kg)    SpO2 93%    BMI 34.87 kg/m   Physical Examination:  General: Awake, alert, well nourished, No acute distress HEENT: TMs intact bilaterally with normal light reflex. Cardio: regular rate and rhythm, S1S2 heard, no murmurs appreciated Pulm: clear to auscultation bilaterally, no wheezes, rhonchi or rales; normal work of breathing on room air MSK: Ambulating independently Neuro: No nystagmus  Assessment/ Plan: 51 y.o. male   Type 2 diabetes mellitus with stage 3a chronic kidney disease, without long-term current use of insulin ([place]) - Plan: Empagliflozin-Linaglip-Metform (TRIJARDY XR) 25-08-998 MG TB24  Dizziness  We will restart him on the Trijardy.  He may discontinue Rybelsus and Synjardy.  It sounds like perhaps Rybelsus and dizziness occurred in conjunction and therefore we will assume that Rybelsus may be causing dizziness.  We will reconvene in about 6 weeks but if the dizziness has not resolved we will need to consider ENT referral.  Ear exam however was unremarkable  No orders of the defined types were placed in this encounter.  No orders of the defined types were placed in this encounter.  Medications Discontinued During This Encounter  Medication Reason   Empagliflozin-metFORMIN HCl ER (SYNJARDY XR) 25-1000 MG TB24    Semaglutide (RYBELSUS) 3 MG TABS       Jonda Alanis Windell Moulding, DO [place] [phone]

## 2021-04-19 ENCOUNTER — Other Ambulatory Visit: Payer: Self-pay | Admitting: Cardiology

## 2021-04-19 ENCOUNTER — Telehealth: Payer: Self-pay

## 2021-04-19 ENCOUNTER — Other Ambulatory Visit: Payer: Self-pay | Admitting: Family Medicine

## 2021-04-19 DIAGNOSIS — E119 Type 2 diabetes mellitus without complications: Secondary | ICD-10-CM

## 2021-04-19 NOTE — Telephone Encounter (Signed)
I don't see any record he was on chlorthalidone. The pharmacy has our name on it?   Zandra Abts MD

## 2021-04-19 NOTE — Telephone Encounter (Signed)
Disregard , we filled his lasix and potassium

## 2021-04-19 NOTE — Telephone Encounter (Signed)
Received fax from [facility] to refill chlorthalidone 12.5 mg qd I do not see where we prescribed this medication. I will forward to Dr.Branch for clarification.

## 2021-04-22 ENCOUNTER — Other Ambulatory Visit: Payer: Self-pay | Admitting: Family Medicine

## 2021-04-22 ENCOUNTER — Telehealth: Payer: Self-pay | Admitting: Family Medicine

## 2021-04-22 DIAGNOSIS — R42 Dizziness and giddiness: Secondary | ICD-10-CM

## 2021-04-22 NOTE — Telephone Encounter (Signed)
Go ahead with referral

## 2021-04-22 NOTE — Telephone Encounter (Signed)
Recommend referral to ENT if still having dizzy spells.  He's been off the med that was thought to be causing so not sure why he is having these.

## 2021-04-22 NOTE — Telephone Encounter (Signed)
Pt was told to call back if he continued to have dizzy spells. Sister stated that he had another last night. Please call back and advise.

## 2021-04-23 ENCOUNTER — Other Ambulatory Visit: Payer: Self-pay | Admitting: Cardiology

## 2021-05-02 ENCOUNTER — Ambulatory Visit (INDEPENDENT_AMBULATORY_CARE_PROVIDER_SITE_OTHER): Payer: Medicare Other

## 2021-05-02 ENCOUNTER — Telehealth (INDEPENDENT_AMBULATORY_CARE_PROVIDER_SITE_OTHER): Payer: Medicare Other | Admitting: Nurse Practitioner

## 2021-05-02 DIAGNOSIS — M25551 Pain in right hip: Secondary | ICD-10-CM
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
## 2021-05-06 ENCOUNTER — Ambulatory Visit (INDEPENDENT_AMBULATORY_CARE_PROVIDER_SITE_OTHER): Payer: Medicare Other | Admitting: Pharmacist

## 2021-05-06 DIAGNOSIS — E119 Type 2 diabetes mellitus without complications: Secondary | ICD-10-CM

## 2021-05-06 DIAGNOSIS — E785 Hyperlipidemia, unspecified: Secondary | ICD-10-CM

## 2021-05-06 DIAGNOSIS — E1169 Type 2 diabetes mellitus with other specified complication: Secondary | ICD-10-CM

## 2021-05-06 NOTE — Patient Instructions (Signed)
Visit Information  Following are the goals we discussed today:  Current Barriers:  Unable to achieve control of T2DM  Suboptimal therapeutic regimen for T2DM  Pharmacist Clinical Goal(s):  patient will achieve & maintain control of T2DM as evidenced by IMPROVED GLYCEMIC CONTROL through collaboration with PharmD and provider.   Interventions: 1:1 collaboration with Janora Norlander, DO regarding development and update of comprehensive plan of care as evidenced by provider attestation and co-signature Inter-disciplinary care team collaboration (see longitudinal plan of care) Comprehensive medication review performed; medication list updated in electronic medical record  Diabetes: Goal on Track (progressing): YES. Uncontrolled; current treatment: trijardy XR;  Patient couldn't tolerated GLP1 due to dizziness-->Rybelsus stopped Restarted Trijardy XR Tolerating well Blood sugars currently at goal--discussed with mom, Katharine Look Current glucose readings: fasting glucose: <165, post prandial glucose: <200 Denies hypoglycemic/hyperglycemic symptoms Discussed meal planning options and Plate method for healthy eating Avoid sugary drinks and desserts Incorporate balanced protein, non starchy veggies, 1 serving of carbohydrate with each meal Increase water intake Increase physical activity as able Current exercise: n/a Recommended continue trijardy Collaborated with PCP   Patient Goals/Self-Care Activities patient will:  - take medications as prescribed as evidenced by patient report and record review check glucose DAILY, document, and provide at future appointments engage in dietary modifications by following [place]: Telephone follow up appointment with care management team member scheduled for:  6 MONTHS  Signature Regina Eck, PharmD, BCPS Clinical Pharmacist, [place]  [facility]  Phone [phone]   Please call the care guide team at [phone] if you need to cancel or reschedule your appointment.   Patient verbalizes understanding of instructions and care plan provided today and agrees to view in [place]. Active MyChart status confirmed with patient.

## 2021-05-06 NOTE — Progress Notes (Signed)
Chronic Care Management Pharmacy Note  05/06/2021 Name:  Michael Ramos MRN:  [id number] DOB:  [birth year]  Summary: T2DM, HLD  Recommendations/Changes made from today's visit:  Diabetes: Goal on Track (progressing): YES. Controlled-A1C 6.8; current treatment: trijardy XR;  Patient couldn't tolerated GLP1 due to dizziness-->Rybelsus stopped Restarted Trijardy XR Tolerating well Blood sugars currently at goal--discussed with mom, Michael Ramos Current glucose readings: fasting glucose: <165, post prandial glucose: <200 Denies hypoglycemic/hyperglycemic symptoms Discussed meal planning options and Plate method for healthy eating Avoid sugary drinks and desserts Incorporate balanced protein, non starchy veggies, 1 serving of carbohydrate with each meal Increase water intake Increase physical activity as able Current exercise: n/a Recommended continue trijardy Collaborated with PCP  Patient Goals/Self-Care Activities patient will:  - take medications as prescribed as evidenced by patient report and record review check glucose DAILY, document, and provide at future appointments engage in dietary modifications by following [place]: F/U 3 MONTHS  Subjective: Michael Ramos is an 51 y.o. year old male who is a primary patient of Janora Norlander, DO.  The CCM team was consulted for assistance with disease management and care coordination needs.    Engaged with patient by telephone for follow up visit in response to provider referral for pharmacy case management and/or care coordination services.   Consent to Services:  The patient was given information about Chronic Care Management services, agreed to services, and gave verbal consent prior to initiation of services.  Please see initial visit note for detailed documentation.   Patient Care Team: Janora Norlander, DO as PCP - General (Family Medicine) Harl Bowie, Alphonse Guild, MD as PCP - Cardiology (Cardiology) Lavera Guise, [facility] as Pharmacist (Family Medicine)  Objective:  Lab Results  Component Value Date   CREATININE 1.61 (H) 02/23/2021   CREATININE 1.45 (H) 11/19/2020   CREATININE 1.58 (H) 08/09/2020    Lab Results  Component Value Date   HGBA1C 6.8 (H) 02/23/2021   Last diabetic Eye exam:  Lab Results  Component Value Date/Time   HMDIABEYEEXA No Retinopathy 07/02/2020 12:00 AM    Last diabetic Foot exam: No results found for: HMDIABFOOTEX      Component Value Date/Time   CHOL 129 02/23/2021 1025   TRIG 196 (H) 02/23/2021 1025   HDL 37 (L) 02/23/2021 1025   CHOLHDL 3.5 02/23/2021 1025   LDLCALC 60 02/23/2021 1025    Hepatic Function Latest Ref Rng & Units 02/23/2021 11/19/2020 08/09/2020  Total Protein 6.0 - 8.5 g/dL 6.6 - -  Albumin 4.0 - 5.0 g/dL 3.9(L) 4.0 4.3  AST 0 - 40 IU/L 17 - -  ALT 0 - 44 IU/L 21 - -  Alk Phosphatase 44 - 121 IU/L 35(L) - -  Total Bilirubin 0.0 - 1.2 mg/dL 0.3 - -  Bilirubin, Direct 0.1 - 0.5 mg/dL - - -    Lab Results  Component Value Date/Time   TSH 1.450 04/29/2019 04:36 PM   TSH 2.230 11/15/2018 09:48 AM    CBC Latest Ref Rng & Units 05/11/2020 04/29/2019 11/15/2018  WBC 3.4 - 10.8 x10E3/uL 7.4 7.1 8.5  Hemoglobin 13.0 - 17.7 g/dL 14.5 14.8 13.9  Hematocrit 37.5 - 51.0 % 47.7 44.2 41.6  Platelets 150 - 450 x10E3/uL 197 216 216    Lab Results  Component Value Date/Time   VD25OH 31.7 08/09/2020 09:43 AM   VD25OH 13.5 (L) 05/11/2020 11:04 AM    Clinical ASCVD: No  The ASCVD Risk score (Arnett DK, et al., 2019) failed to calculate for the following reasons:   The valid total cholesterol range is 130 to 320 mg/dL    Other: (CHADS2VASc if Afib, PHQ9 if depression, MMRC or CAT for COPD, ACT, DEXA)  Social History   Tobacco Use  Smoking Status Never  Smokeless Tobacco Never   BP Readings from Last 3 Encounters:  04/18/21 125/70  02/23/21 124/83  11/19/20 119/81   Pulse Readings from Last  3 Encounters:  04/18/21 86  02/23/21 79  11/19/20 84   Wt Readings from Last 3 Encounters:  04/18/21 279 lb (126.6 kg)  02/23/21 284 lb 9.6 oz (129.1 kg)  02/04/21 292 lb (132.5 kg)    Assessment: Review of patient past medical history, allergies, medications, health status, including review of consultants reports, laboratory and other test data, was performed as part of comprehensive evaluation and provision of chronic care management services.   SDOH:  (Social Determinants of Health) assessments and interventions performed:    CCM Care Plan  No Known Allergies  Medications Reviewed Today     Reviewed by Lavera Guise, [facility] (Pharmacist) on 05/06/21 at [place] List Status: <None>   Medication Order Taking? Sig Documenting Provider Last Dose Status Informant  carvedilol (COREG) 25 MG tablet [id number] No TAKE ONE TABLET BY MOUTH TWICE DAILY Branch, Alphonse Guild, MD Taking Active   diclofenac Sodium (VOLTAREN) 1 % GEL [id number] No Voltaren 1 % topical gel [provider] Taking Active   Empagliflozin-Linaglip-Metform (TRIJARDY XR) 25-08-998 MG TB24 [id number]  Take 1 tablet by mouth daily. [address] AND SYNJARDY [place]  Active   ENTRESTO 49-51 MG [id number] No TAKE ONE TABLET BY MOUTH TWICE DAILY Branch, Alphonse Guild, MD Taking Active   furosemide (LASIX) 20 MG tablet [id number]  TAKE ONE TABLET BY MOUTH DAILY AS NEEDED Arnoldo Lenis, MD  Active   glipiZIDE (GLUCOTROL) 5 MG tablet [id number]  TAKE 1 TABLET TWICE DAILY BEFORE MEALS Ronnie Doss M, DO  Active   loratadine (CLARITIN) 10 MG tablet [id number] No TAKE ONE (1) TABLET EACH DAY [place], [place], [place] Taking Active   pantoprazole (PROTONIX) 40 MG tablet [id number] No TAKE ONE (1) TABLET EACH DAY [place], [place], DO Taking Active   potassium chloride SA (KLOR-CON M) 20 MEQ tablet [id number]  TAKE TWO TABLETS BY MOUTH DAILY Arnoldo Lenis, MD  Active   pravastatin (PRAVACHOL) 10 MG tablet  [id number] No TAKE ONE (1) TABLET EACH DAY [place], [place] Taking Active             Patient Active Problem List   Diagnosis Date Noted   Hyperlipidemia associated with type 2 diabetes mellitus ([place]) 10/31/2019   Congestive heart failure ([place]) 12/22/2017   Low back pain 12/22/2017   Pain of left hip joint 12/22/2017   Obesity (BMI 30-39.9) 17/91/5056   Chronic systolic heart failure (HCC)    Non-ischemic cardiomyopathy (HCC)    Dilated cardiomyopathy ([place]) 08/28/2016   Acute on chronic renal insufficiency 08/28/2016   Essential hypertension 97/94/8016   Acute systolic CHF (congestive heart failure) ([place]) 08/18/2016   Shortness of breath 08/15/2016   Type 2 diabetes mellitus without complication, without long-term current use of insulin ([place]) 03/14/2016   Chronic nonseasonal allergic rhinitis due to pollen 03/14/2016   Autism 03/14/2016    Immunization History  Administered Date(s) Administered   Influenza,inj,Quad PF,6+ Mos 01/18/2016, 01/10/2017, 01/31/2018, 02/09/2020   Influenza-Unspecified 01/23/2021  PFIZER(Purple Top)SARS-COV-2 Vaccination 07/17/2019, 08/08/2019, 02/29/2020   Pneumococcal Polysaccharide-23 01/10/2017   Tdap 02/09/2020    Conditions to be addressed/monitored: HLD and DMII  Care Plan : PHARMD MEDICATION MANAGEMENT  Updates made by Lavera Guise, RPH since 05/06/2021 12:00 AM     Problem: DISEASE PROGRESSION PREVENTION      Long-Range Goal: T2DM   Recent Progress: Not on track  Priority: High  Note:   Current Barriers:  Unable to achieve control of T2DM  Suboptimal therapeutic regimen for T2DM  Pharmacist Clinical Goal(s):  patient will achieve & maintain control of T2DM as evidenced by IMPROVED GLYCEMIC CONTROL through collaboration with PharmD and provider.   Interventions: 1:1 collaboration with Janora Norlander, DO regarding development and update of comprehensive plan of care as evidenced by provider attestation and  co-signature Inter-disciplinary care team collaboration (see longitudinal plan of care) Comprehensive medication review performed; medication list updated in electronic medical record  Diabetes: Goal on Track (progressing): YES. Uncontrolled; current treatment: trijardy XR;  Patient couldn't tolerated GLP1 due to dizziness-->Rybelsus stopped Restarted Trijardy XR Tolerating well Blood sugars currently at goal--discussed with mom, Michael Ramos Current glucose readings: fasting glucose: <165, post prandial glucose: <200 Denies hypoglycemic/hyperglycemic symptoms Discussed meal planning options and Plate method for healthy eating Avoid sugary drinks and desserts Incorporate balanced protein, non starchy veggies, 1 serving of carbohydrate with each meal Increase water intake Increase physical activity as able Current exercise: n/a Recommended continue trijardy Collaborated with PCP   Patient Goals/Self-Care Activities patient will:  - take medications as prescribed as evidenced by patient report and record review check glucose DAILY, document, and provide at future appointments engage in dietary modifications by following HEART HEALTHY diet--WILL ALSO BE BENEFICIAL FOR CHOLESTEROL      Medication Assistance: None required.  Patient affirms current coverage meets needs.  Patient's preferred pharmacy is:  [facility] - [address], [place] [address] Phone: [phone] Fax: [phone]  [facility] [place], [address] [place] [address] Phone: [phone] Fax: [phone]  Uses pill box? No - MOM ASSISTS WITH MEDICATIONS EVERY DAY MOM endorses 100% compliance  Follow Up:  Patient agrees to Care Plan and Follow-up.  Plan: Telephone follow up appointment with care management team member scheduled for:  6 MONTHS  Regina Eck, PharmD, BCPS Clinical Pharmacist,  [place]  [facility] Phone [phone]

## 2021-05-11 ENCOUNTER — Other Ambulatory Visit: Payer: Self-pay | Admitting: Cardiology

## 2021-05-20 ENCOUNTER — Encounter: Payer: Self-pay | Admitting: Cardiology

## 2021-05-20 ENCOUNTER — Ambulatory Visit (INDEPENDENT_AMBULATORY_CARE_PROVIDER_SITE_OTHER): Payer: Medicare Other | Admitting: Cardiology

## 2021-05-20 ENCOUNTER — Ambulatory Visit: Payer: Medicare Other | Admitting: Cardiology

## 2021-05-20 ENCOUNTER — Other Ambulatory Visit: Payer: Self-pay

## 2021-05-20 VITALS — BP 114/70 | HR 90 | Ht 75.0 in | Wt 279.4 lb

## 2021-05-20 DIAGNOSIS — E782 Mixed hyperlipidemia: Secondary | ICD-10-CM

## 2021-05-20 DIAGNOSIS — I5022 Chronic systolic (congestive) heart failure: Secondary | ICD-10-CM | POA: Diagnosis not present

## 2021-05-20 NOTE — Patient Instructions (Addendum)
Follow-Up: °Follow up with Dr. Branch in 6 months ° °Any Other Special Instructions Will Be Listed Below (If Applicable). ° ° ° ° °If you need a refill on your cardiac medications before your next appointment, please call your pharmacy. ° °

## 2021-05-20 NOTE — Progress Notes (Signed)
Clinical Summary [name] is a 51 y.o.male seen today for follow up of the following medical problems.      1. Chronic systolic HF/NICM - new diagnosis during 08/2016 admission with fluid overload - 02/2017 cath no significant CAD.     07/2017 echo LVEF 35-40%, grade II diastolic dysfunction     09/9483 echo: LVEF 50-55%, no WMAs   - no SOB/DOE. No LE edema - compliant with meds. Has not needed prn lasix.        2. Nocturnal hypoxia/Possible sleep apnea - followed by Dr Radford Pax - drop in O2 sats noted during home oximetry. There were discussions about a formal sleep study however family does not feel patient could cooperate with study due to his auticsion     3. Autism   4. CKD - Cr stable 1.4 to 1.6  5. Hyperlipidemia -02/2021 TC 129 TG 196 HDL 37 LDL 60    SH: Has 7 brothers and sisters.       Past Medical History:  Diagnosis Date   Allergy    Autism    CHF (congestive heart failure) ([place])    a. EF 20-25% by echo in 08/2016 b. repeat echo in 02/2017 showing EF 30-35% with cath showing normal cors.    Diabetes mellitus without complication (HCC)    Obesity (BMI 30-39.9) 10/23/2017     Allergies  Allergen Reactions   Semaglutide Other (See Comments)    DIZZINESS     Current Outpatient Medications  Medication Sig Dispense Refill   carvedilol (COREG) 25 MG tablet TAKE ONE TABLET BY MOUTH TWICE DAILY 180 tablet 3   diclofenac Sodium (VOLTAREN) 1 % GEL Voltaren 1 % topical gel     Empagliflozin-Linaglip-Metform (TRIJARDY XR) 25-08-998 MG TB24 Take 1 tablet by mouth daily. STOP RYBELSUS AND SYNJARDY 30 tablet 12   ENTRESTO 49-51 MG TAKE ONE TABLET BY MOUTH TWICE DAILY 60 tablet 3   furosemide (LASIX) 20 MG tablet TAKE ONE TABLET BY MOUTH DAILY AS NEEDED 90 tablet 0   glipiZIDE (GLUCOTROL) 5 MG tablet TAKE 1 TABLET TWICE DAILY BEFORE MEALS 60 tablet 1   loratadine (CLARITIN) 10 MG tablet TAKE ONE (1) TABLET EACH DAY 90 tablet 3   pantoprazole (PROTONIX)  40 MG tablet TAKE ONE (1) TABLET EACH DAY 90 tablet 2   potassium chloride SA (KLOR-CON M) 20 MEQ tablet TAKE TWO TABLETS BY MOUTH DAILY 180 tablet 0   pravastatin (PRAVACHOL) 10 MG tablet TAKE ONE (1) TABLET EACH DAY 30 tablet 5   No current facility-administered medications for this visit.     Past Surgical History:  Procedure Laterality Date   RIGHT/LEFT HEART CATH AND CORONARY ANGIOGRAPHY N/A 03/06/2017   Procedure: RIGHT/LEFT HEART CATH AND CORONARY ANGIOGRAPHY;  Surgeon: Burnell Blanks, MD;  Location: [facility];  Service: Cardiovascular;  Laterality: N/A;     Allergies  Allergen Reactions   Semaglutide Other (See Comments)    DIZZINESS      Family History  Problem Relation Age of Onset   Diabetes Mother    Diabetes Father    Bladder Cancer Father    Colon cancer Neg Hx    Esophageal cancer Neg Hx    Liver cancer Neg Hx    Stomach cancer Neg Hx      Social History Michael Ramos reports that he has never smoked. He has never used smokeless tobacco. Michael Ramos reports no history of alcohol use.   Review of Systems  CONSTITUTIONAL: No weight loss, fever, chills, weakness or fatigue.  HEENT: Eyes: No visual loss, blurred vision, double vision or yellow sclerae.No hearing loss, sneezing, congestion, runny nose or sore throat.  SKIN: No rash or itching.  CARDIOVASCULAR: per hpi RESPIRATORY: No shortness of breath, cough or sputum.  GASTROINTESTINAL: No anorexia, nausea, vomiting or diarrhea. No abdominal pain or blood.  GENITOURINARY: No burning on urination, no polyuria NEUROLOGICAL: No headache, dizziness, syncope, paralysis, ataxia, numbness or tingling in the extremities. No change in bowel or bladder control.  MUSCULOSKELETAL: No muscle, back pain, joint pain or stiffness.  LYMPHATICS: No enlarged nodes. No history of splenectomy.  PSYCHIATRIC: No history of depression or anxiety.  ENDOCRINOLOGIC: No reports of sweating, cold or heat intolerance.  No polyuria or polydipsia.  Marland Kitchen   Physical Examination Today's Vitals   05/20/21 1112  BP: 114/70  Pulse: 90  SpO2: 98%  Weight: 279 lb 6.4 oz (126.7 kg)  Height: 6\' 3"  (1.905 m)   Body mass index is 34.92 kg/m.  Gen: resting comfortably, no acute distress HEENT: no scleral icterus, pupils equal round and reactive, no palptable cervical adenopathy,  CV: RRR, no m/r/g no jvd Resp: Clear to auscultation bilaterally GI: abdomen is soft, non-tender, non-distended, normal bowel sounds, no hepatosplenomegaly MSK: extremities are warm, no edema.  Skin: warm, no rash Neuro:  no focal deficits Psych: appropriate affect   Diagnostic Studies 02/2017 cath Hemodynamic findings consistent with mild pulmonary hypertension.   1. No angiographic evidence of CAD 2. Non-ischemic cardiomyopathy   Recommendations: medical management of cardiomyopathy and CHF.      02/2017 echo Study Conclusions   - Left ventricle: The cavity size was mildly dilated. Wall   thickness was normal. Systolic function was moderately to   severely reduced. The estimated ejection fraction was in the   range of 30% to 35%. Diffuse hypokinesis. Diastolic dysfunction,   grade indeterminate. Indeterminate filling pressures. - Mitral valve: There was mild regurgitation.     07/2017 echo Study Conclusions   - Left ventricle: The cavity size was mildly dilated. Wall   thickness was normal. Systolic function was moderately reduced.   The estimated ejection fraction was in the range of 35% to 40%.   Diffuse hypokinesis. Features are consistent with a pseudonormal   left ventricular filling pattern, with concomitant abnormal   relaxation and increased filling pressure (grade 2 diastolic   dysfunction). - Mitral valve: There was mild regurgitation.     09/2019 echo 1. Left ventricular ejection fraction, by estimation, is 50 to 55%. The  left ventricle has normal function. The left ventricle has no regional  wall  motion abnormalities. Left ventricular diastolic parameters were  normal.   2. Right ventricular systolic function is normal. The right ventricular  size is normal. There is normal pulmonary artery systolic pressure.   3. The mitral valve is normal in structure. No evidence of mitral valve  regurgitation. No evidence of mitral stenosis.   4. The aortic valve was not well visualized. Aortic valve regurgitation  is not visualized. No aortic stenosis is present.   5. The inferior vena cava is normal in size with greater than 50%  respiratory variability, suggesting right atrial pressure of 3 mmHg.        Assessment and Plan  1. Chronic systolic HF/ NICM - medical therapy limited by his renal function and soft bp's - by most recent echo LVEF has noramlzied -no recent symptoms, continue current meds    2.Hyperlipidemia -  at goal, continue pravastatin     Arnoldo Lenis, M.D.

## 2021-05-31 ENCOUNTER — Encounter: Payer: Self-pay | Admitting: Family Medicine

## 2021-05-31 ENCOUNTER — Ambulatory Visit (INDEPENDENT_AMBULATORY_CARE_PROVIDER_SITE_OTHER): Payer: Medicare Other | Admitting: Family Medicine

## 2021-05-31 VITALS — BP 106/77 | HR 81 | Temp 98.0°F | Ht 75.0 in | Wt 278.8 lb

## 2021-05-31 DIAGNOSIS — N189 Chronic kidney disease, unspecified: Secondary | ICD-10-CM | POA: Diagnosis not present

## 2021-05-31 DIAGNOSIS — K219 Gastro-esophageal reflux disease without esophagitis: Secondary | ICD-10-CM | POA: Diagnosis not present

## 2021-05-31 DIAGNOSIS — R42 Dizziness and giddiness: Secondary | ICD-10-CM | POA: Diagnosis not present

## 2021-05-31 DIAGNOSIS — N289 Disorder of kidney and ureter, unspecified: Secondary | ICD-10-CM | POA: Diagnosis not present

## 2021-05-31 DIAGNOSIS — E119 Type 2 diabetes mellitus without complications: Secondary | ICD-10-CM | POA: Diagnosis not present

## 2021-05-31 LAB — BAYER DCA HB A1C WAIVED: HB A1C (BAYER DCA - WAIVED): 6.4 % — ABNORMAL HIGH (ref 4.8–5.6)

## 2021-05-31 MED ORDER — PANTOPRAZOLE SODIUM 40 MG PO TBEC: 40.0000 mg | DELAYED_RELEASE_TABLET | Freq: Two times a day (BID) | ORAL | 2 refills | Status: DC

## 2021-05-31 MED ORDER — GLIPIZIDE 5 MG PO TABS: 5.0000 mg | ORAL_TABLET | Freq: Two times a day (BID) | ORAL | 1 refills | Status: DC

## 2021-05-31 NOTE — Progress Notes (Signed)
Subjective: CC:DM PCP: Michael Norlander, DO Michael Ramos is a 51 y.o. male who is accompanied today's visit by his sister, Michael Ramos.  He is presenting to clinic today for:  1. Type 2 Diabetes with hypertension, hyperlipidemia and heart disease:  Compliant with glipizide, Trijardy.  Takes all of his heart medications as prescribed.  Last eye exam: Up-to-date Last foot exam: Needs Last A1c:  Lab Results  Component Value Date   HGBA1C 6.4 (H) 05/31/2021   Nephropathy screen indicated?:  On ARB Last flu, zoster and/or pneumovax:  Immunization History  Administered Date(s) Administered   Influenza,inj,Quad PF,6+ Mos 01/18/2016, 01/10/2017, 01/31/2018, 02/09/2020   Influenza-Unspecified 01/23/2021   PFIZER(Purple Top)SARS-COV-2 Vaccination 07/17/2019, 08/08/2019, 02/29/2020   Pneumococcal Polysaccharide-23 01/10/2017   Tdap 02/09/2020    ROS: Reports intermittent dizziness since her last visit, occurring twice.  Denies LOC, polyuria, polydipsia, unintended weight loss/gain, foot ulcerations, numbness or tingling in extremities, shortness of breath or chest pain.  2.  Sulfur burps Patient has been suffering from foul belching for quite some time now.  He uses his Protonix daily as prescribed.  He is no longer on the GLP.  No nausea, vomiting, change in p.o. intake.  He does drink sodas frequently.  ROS: Per HPI  Allergies  Allergen Reactions   Semaglutide Other (See Comments)    DIZZINESS   Past Medical History:  Diagnosis Date   Allergy    Autism    CHF (congestive heart failure) ([place])    a. EF 20-25% by echo in 08/2016 b. repeat echo in 02/2017 showing EF 30-35% with cath showing normal cors.    Diabetes mellitus without complication (HCC)    Obesity (BMI 30-39.9) 10/23/2017    Current Outpatient Medications:    carvedilol (COREG) 25 MG tablet, TAKE ONE TABLET BY MOUTH TWICE DAILY, Disp: 180 tablet, Rfl: 3   diclofenac Sodium (VOLTAREN) 1 % GEL, Voltaren 1 %  topical gel, Disp: , Rfl:    Empagliflozin-Linaglip-Metform (TRIJARDY XR) 25-08-998 MG TB24, Take 1 tablet by mouth daily. STOP RYBELSUS AND SYNJARDY, Disp: 30 tablet, Rfl: 12   ENTRESTO 49-51 MG, TAKE ONE TABLET BY MOUTH TWICE DAILY, Disp: 60 tablet, Rfl: 3   furosemide (LASIX) 20 MG tablet, TAKE ONE TABLET BY MOUTH DAILY AS NEEDED, Disp: 90 tablet, Rfl: 0   glipiZIDE (GLUCOTROL) 5 MG tablet, TAKE 1 TABLET TWICE DAILY BEFORE MEALS, Disp: 60 tablet, Rfl: 1   loratadine (CLARITIN) 10 MG tablet, TAKE ONE (1) TABLET EACH DAY, Disp: 90 tablet, Rfl: 3   pantoprazole (PROTONIX) 40 MG tablet, TAKE ONE (1) TABLET EACH DAY, Disp: 90 tablet, Rfl: 2   potassium chloride SA (KLOR-CON M) 20 MEQ tablet, TAKE TWO TABLETS BY MOUTH DAILY, Disp: 180 tablet, Rfl: 0   pravastatin (PRAVACHOL) 10 MG tablet, TAKE ONE (1) TABLET EACH DAY, Disp: 30 tablet, Rfl: 5 Social History   Socioeconomic History   Marital status: Single    Spouse name: Not on file   Number of children: Not on file   Years of education: Not on file   Highest education level: 12th grade  Occupational History   Occupation: Disabled  Tobacco Use   Smoking status: Never   Smokeless tobacco: Never  Vaping Use   Vaping Use: Never used  Substance and Sexual Activity   Alcohol use: No   Drug use: No   Sexual activity: Not Currently  Other Topics Concern   Not on file  Social History Narrative   Autistic  Lives with Mother   Social Determinants of Health   Financial Resource Strain: Low Risk    Difficulty of Paying Living Expenses: Not hard at all  Food Insecurity: No Food Insecurity   Worried About Charity fundraiser in the Last Year: Never true   Arboriculturist in the Last Year: Never true  Transportation Needs: No Transportation Needs   Lack of Transportation (Medical): No   Lack of Transportation (Non-Medical): No  Physical Activity: Insufficiently Active   Days of Exercise per Week: 7 days   Minutes of Exercise per Session:  20 min  Stress: No Stress Concern Present   Feeling of Stress : Only a little  Social Connections: Socially Isolated   Frequency of Communication with Friends and Family: More than three times a week   Frequency of Social Gatherings with Friends and Family: More than three times a week   Attends Religious Services: Never   Marine scientist or Organizations: No   Attends Music therapist: Never   Marital Status: Never married  Human resources officer Violence: Not At Risk   Fear of Current or Ex-Partner: No   Emotionally Abused: No   Physically Abused: No   Sexually Abused: No   Family History  Problem Relation Age of Onset   Diabetes Mother    Diabetes Father    Bladder Cancer Father    Colon cancer Neg Hx    Esophageal cancer Neg Hx    Liver cancer Neg Hx    Stomach cancer Neg Hx     Objective: Office vital signs reviewed. BP 106/77    Pulse 81    Temp 98 F (36.7 C)    Ht 6\' 3"  (1.905 m)    Wt 278 lb 12.8 oz (126.5 kg)    SpO2 93%    BMI 34.85 kg/m   Physical Examination:  General: Awake, alert, nontoxic male, No acute distress HEENT: Sclera white. Cardio: regular rate and rhythm, S1S2 heard, no murmurs appreciated Pulm: clear to auscultation bilaterally, no wheezes, rhonchi or rales; normal work of breathing on room air Skin: dry; intact; no rashes or lesions Neuro: Response to questioning.  Developmentally delayed  Diabetic Foot Exam - Simple   Simple Foot Form Diabetic Foot exam was performed with the following findings: Yes 05/31/2021 12:37 PM  Visual Inspection See comments: Yes Sensation Testing Intact to touch and monofilament testing bilaterally: Yes Pulse Check Posterior Tibialis and Dorsalis pulse intact bilaterally: Yes Comments Dry skin.  Onychomycosis of the nails on bilateral feet.      Assessment/ Plan: 51 y.o. male   Type 2 diabetes mellitus without complication, without long-term current use of insulin (HCC) - Plan: Bayer DCA Hb  A1c Waived, glipiZIDE (GLUCOTROL) 5 MG tablet  Acute on chronic renal insufficiency - Plan: Renal function panel  Gastroesophageal reflux disease without esophagitis - Plan: pantoprazole (PROTONIX) 40 MG tablet  Dizziness  Sugar remains under excellent control with A1c of 6.4 today.  No changes.  Glipizide renewed.  Continue Trijardy.  Check renal function panel.  He had a slight rise in creatinine compared to previous checkup last visit.  I suspect that the bowel belching that he has been experiencing is likely related to uncontrolled GERD.  I would like him to advance his PPI to twice daily for the next couple of weeks.  If he finds this helpful, would like him to trial back down on once daily.  If symptoms resume, may need to  consider ongoing twice daily use versus addition of Pepcid.  Continues to have intermittent episodes of dizziness.  I question orthostasis.  Blood pressure was on the low side of normal today.  I have asked the patient's mother to check blood pressure when he has these dizzy episodes.  He can even come in and see the nurse for orthostatic vital signs.  Encourage p.o. hydration with water.  May need to consider referral to ENT versus neurology if dizziness recurs  Orders Placed This Encounter  Procedures   Renal function panel   Bayer DCA Hb A1c Waived   No orders of the defined types were placed in this encounter.    Michael Norlander, DO [place] [phone]

## 2021-06-01 LAB — RENAL FUNCTION PANEL
Albumin: 4.2 g/dL (ref 4.0–5.0)
BUN/Creatinine Ratio: 7 — ABNORMAL LOW (ref 9–20)
BUN: 13 mg/dL (ref 6–24)
CO2: 25 mmol/L (ref 20–29)
Calcium: 9.1 mg/dL (ref 8.7–10.2)
Chloride: 103 mmol/L (ref 96–106)
Creatinine, Ser: 1.76 mg/dL — ABNORMAL HIGH (ref 0.76–1.27)
Glucose: 92 mg/dL (ref 70–99)
Phosphorus: 3.5 mg/dL (ref 2.8–4.1)
Potassium: 4.3 mmol/L (ref 3.5–5.2)
Sodium: 142 mmol/L (ref 134–144)
eGFR: 47 mL/min/{1.73_m2} — ABNORMAL LOW (ref >59)

## 2021-07-05 ENCOUNTER — Encounter: Payer: Self-pay | Admitting: Family Medicine

## 2021-07-05 ENCOUNTER — Ambulatory Visit: Payer: Medicare Other | Admitting: Family

## 2021-07-08 ENCOUNTER — Ambulatory Visit: Payer: Medicare Other | Admitting: Family Medicine

## 2021-07-08 ENCOUNTER — Ambulatory Visit (INDEPENDENT_AMBULATORY_CARE_PROVIDER_SITE_OTHER): Payer: Medicare Other | Admitting: Family Medicine

## 2021-07-08 ENCOUNTER — Encounter: Payer: Self-pay | Admitting: Family Medicine

## 2021-07-08 VITALS — BP 110/76 | HR 87 | Temp 97.2°F | Ht 75.0 in | Wt 274.1 lb

## 2021-07-08 DIAGNOSIS — R42 Dizziness and giddiness: Secondary | ICD-10-CM | POA: Diagnosis not present

## 2021-07-08 DIAGNOSIS — K219 Gastro-esophageal reflux disease without esophagitis: Secondary | ICD-10-CM | POA: Diagnosis not present

## 2021-07-08 MED ORDER — MECLIZINE HCL 12.5 MG PO TABS: 12.5000 mg | ORAL_TABLET | Freq: Three times a day (TID) | ORAL | 1 refills | Status: DC | PRN

## 2021-07-08 NOTE — Patient Instructions (Signed)
Vertigo Vertigo is the feeling that you or your surroundings are moving when they are not. This feeling can come and go at any time. Vertigo often goes away on its own. Vertigo can be dangerous if it occurs while you are doing something thatcould endanger yourself or others, such as driving or operating machinery. Your health care provider will do tests to try to determine the cause of your vertigo. Tests will also help your health care provider decide how best totreat your condition. Follow these instructions at home: Eating and drinking     Dehydration can make vertigo worse. Drink enough fluid to keep your urine pale yellow. Do not drink alcohol. Activity Return to your normal activities as told by your health care provider. Ask your health care provider what activities are safe for you. In the morning, first sit up on the side of the bed. When you feel okay, stand slowly while you hold onto something until you know that your balance is fine. Move slowly. Avoid sudden body or head movements or certain positions, as told by your health care provider. If you have trouble walking or keeping your balance, try using a cane for stability. If you feel dizzy or unstable, sit down right away. Avoid doing any tasks that would cause danger to you or others if vertigo occurs. Avoid bending down if you feel dizzy. Place items in your home so that they are easy for you to reach without bending or leaning over. Do not drive or use machinery if you feel dizzy. General instructions Take over-the-counter and prescription medicines only as told by your health care provider. Keep all follow-up visits. This is important. Contact a health care provider if: Your medicines do not relieve your vertigo or they make it worse. Your condition gets worse or you develop new symptoms. You have a fever. You develop nausea or vomiting, or if nausea gets worse. Your family or friends notice any behavioral changes. You  have numbness or a prickling and tingling sensation in part of your body. Get help right away if you: Are always dizzy or you faint. Develop severe headaches. Develop a stiff neck. Develop sensitivity to light. Have difficulty moving or speaking. Have weakness in your hands, arms, or legs. Have changes in your hearing or vision. These symptoms may represent a serious problem that is an emergency. Do not wait to see if the symptoms will go away. Get medical help right away. Call your local emergency services (911 in the [place] Do not drive yourself to the hospital. Summary Vertigo is the feeling that you or your surroundings are moving when they are not. Your health care provider will do tests to try to determine the cause of your vertigo. Follow instructions for home care. You may be told to avoid certain tasks, positions, or movements. Contact a health care provider if your medicines do not relieve your symptoms, or if you have a fever, nausea, vomiting, or changes in behavior. Get help right away if you have severe headaches or difficulty speaking, or you develop hearing or vision problems. This information is not intended to replace advice given to you by your health care provider. Make sure you discuss any questions you have with your healthcare provider. Document Revised: 02/25/2020 Document Reviewed: 02/25/2020 Elsevier Patient Education  2022 Elsevier Inc.  

## 2021-07-08 NOTE — Progress Notes (Signed)
? ?Acute Office Visit ? ?Subjective:  ? ? Patient ID: Michael Ramos, male    DOB: [birth year], 51 y.o.   MRN: [id number] ? ?Chief Complaint  ?Patient presents with  ? Dizziness  ? ? ?HPI ?Here with sister who helped provide the history. Patient is in today for dizziness. This last occurred a few nights ago. This has happen a few times in the last few week. He reports dizziness that occurs when lying down. The dizziness feels like the room is spinning. This makes him feel so nauseous that he vomits. He will only have 1 episode of vomiting. After this occurs he will feel back to normal and go back to sleep. Episodes are short. The last two times that this happened, they have checked his blood pressure and blood sugar and both were normal each time. Denies focal weakness, confusion, chest pain, shortness of breath, changes in vision, changes in gait, HA, congestion, ear pain, or fever.  ? ?He also continues to have sulfur burps. He has been taking protonix once a day. Denies other symptoms.  ? ?Past Medical History:  ?Diagnosis Date  ? Allergy   ? Autism   ? CHF (congestive heart failure) ([place])   ? a. EF 20-25% by echo in 08/2016 b. repeat echo in 02/2017 showing EF 30-35% with cath showing normal cors.   ? Diabetes mellitus without complication ([place])   ? Obesity (BMI 30-39.9) 10/23/2017  ? ? ?Past Surgical History:  ?Procedure Laterality Date  ? RIGHT/LEFT HEART CATH AND CORONARY ANGIOGRAPHY N/A 03/06/2017  ? Procedure: RIGHT/LEFT HEART CATH AND CORONARY ANGIOGRAPHY;  Surgeon: Burnell Blanks, MD;  Location: [facility];  Service: Cardiovascular;  Laterality: N/A;  ? ? ?Family History  ?Problem Relation Age of Onset  ? Diabetes Mother   ? Diabetes Father   ? Bladder Cancer Father   ? Colon cancer Neg Hx   ? Esophageal cancer Neg Hx   ? Liver cancer Neg Hx   ? Stomach cancer Neg Hx   ? ? ?Social History  ? ?Socioeconomic History  ? Marital status: Single  ?  Spouse name: Not on file  ? Number of  children: Not on file  ? Years of education: Not on file  ? Highest education level: 12th grade  ?Occupational History  ? Occupation: Disabled  ?Tobacco Use  ? Smoking status: Never  ? Smokeless tobacco: Never  ?Vaping Use  ? Vaping Use: Never used  ?Substance and Sexual Activity  ? Alcohol use: No  ? Drug use: No  ? Sexual activity: Not Currently  ?Other Topics Concern  ? Not on file  ?Social History Narrative  ? Autistic  ? Lives with Mother  ? ?Social Determinants of Health  ? ?Financial Resource Strain: Low Risk   ? Difficulty of Paying Living Expenses: Not hard at all  ?Food Insecurity: No Food Insecurity  ? Worried About Charity fundraiser in the Last Year: Never true  ? Ran Out of Food in the Last Year: Never true  ?Transportation Needs: No Transportation Needs  ? Lack of Transportation (Medical): No  ? Lack of Transportation (Non-Medical): No  ?Physical Activity: Insufficiently Active  ? Days of Exercise per Week: 7 days  ? Minutes of Exercise per Session: 20 min  ?Stress: No Stress Concern Present  ? Feeling of Stress : Only a little  ?Social Connections: Socially Isolated  ? Frequency of Communication with Friends and Family: More than three times a week  ?  Frequency of Social Gatherings with Friends and Family: More than three times a week  ? Attends Religious Services: Never  ? Active Member of Clubs or Organizations: No  ? Attends Archivist Meetings: Never  ? Marital Status: Never married  ?Intimate Partner Violence: Not At Risk  ? Fear of Current or Ex-Partner: No  ? Emotionally Abused: No  ? Physically Abused: No  ? Sexually Abused: No  ? ? ?Outpatient Medications Prior to Visit  ?Medication Sig Dispense Refill  ? carvedilol (COREG) 25 MG tablet TAKE ONE TABLET BY MOUTH TWICE DAILY 180 tablet 3  ? diclofenac Sodium (VOLTAREN) 1 % GEL Voltaren 1 % topical gel    ? Empagliflozin-Linaglip-Metform (TRIJARDY XR) 25-08-998 MG TB24 Take 1 tablet by mouth daily. STOP RYBELSUS AND SYNJARDY 30  tablet 12  ? ENTRESTO 49-51 MG TAKE ONE TABLET BY MOUTH TWICE DAILY 60 tablet 3  ? furosemide (LASIX) 20 MG tablet TAKE ONE TABLET BY MOUTH DAILY AS NEEDED 90 tablet 0  ? glipiZIDE (GLUCOTROL) 5 MG tablet Take 1 tablet (5 mg total) by mouth 2 (two) times daily before a meal. 180 tablet 1  ? loratadine (CLARITIN) 10 MG tablet TAKE ONE (1) TABLET EACH DAY 90 tablet 3  ? pantoprazole (PROTONIX) 40 MG tablet Take 1 tablet (40 mg total) by mouth 2 (two) times daily before a meal. As directed by Dr Darnell Level 180 tablet 2  ? potassium chloride SA (KLOR-CON M) 20 MEQ tablet TAKE TWO TABLETS BY MOUTH DAILY 180 tablet 0  ? pravastatin (PRAVACHOL) 10 MG tablet TAKE ONE (1) TABLET EACH DAY 30 tablet 5  ? ?No facility-administered medications prior to visit.  ? ? ?Allergies  ?Allergen Reactions  ? Semaglutide Other (See Comments)  ?  DIZZINESS  ? ? ?Review of Systems ?As per HPI.  ?   ?Objective:  ?  ?Physical Exam ?Vitals and nursing note reviewed.  ?Constitutional:   ?   General: He is not in acute distress. ?   Appearance: He is not ill-appearing, toxic-appearing or diaphoretic.  ?HENT:  ?   Head: Normocephalic and atraumatic.  ?   Right Ear: Tympanic membrane, ear canal and external ear normal.  ?   Left Ear: Tympanic membrane, ear canal and external ear normal.  ?   Nose: Nose normal.  ?   Mouth/Throat:  ?   Mouth: Mucous membranes are moist.  ?   Pharynx: Oropharynx is clear.  ?Eyes:  ?   Extraocular Movements: Extraocular movements intact.  ?   Conjunctiva/sclera: Conjunctivae normal.  ?   Pupils: Pupils are equal, round, and reactive to light.  ?Musculoskeletal:  ?   Right lower leg: No edema.  ?   Left lower leg: No edema.  ?Skin: ?   General: Skin is warm and dry.  ?Neurological:  ?   General: No focal deficit present.  ?   Mental Status: He is alert and oriented to person, place, and time.  ?Psychiatric:     ?   Mood and Affect: Mood normal.  ? ? ?BP 110/76   Pulse 87   Temp (!) 97.2 ?F (36.2 ?C) (Temporal)   Ht '6\' 3"'   (1.905 m)   Wt 274 lb 2 oz (124.3 kg)   BMI 34.26 kg/m?  ?Wt Readings from Last 3 Encounters:  ?07/08/21 274 lb 2 oz (124.3 kg)  ?05/31/21 278 lb 12.8 oz (126.5 kg)  ?05/20/21 279 lb 6.4 oz (126.7 kg)  ? ? ?Health Maintenance Due  ?  Topic Date Due  ? COVID-19 Vaccine (4 - Booster for Pfizer series) 04/25/2020  ? OPHTHALMOLOGY EXAM  07/02/2021  ? ? ?There are no preventive care reminders to display for this patient. ? ? ?Lab Results  ?Component Value Date  ? TSH 1.450 04/29/2019  ? ?Lab Results  ?Component Value Date  ? WBC 7.4 05/11/2020  ? HGB 14.5 05/11/2020  ? HCT 47.7 05/11/2020  ? MCV 86 05/11/2020  ? PLT 197 05/11/2020  ? ?Lab Results  ?Component Value Date  ? NA 142 05/31/2021  ? K 4.3 05/31/2021  ? CO2 25 05/31/2021  ? GLUCOSE 92 05/31/2021  ? BUN 13 05/31/2021  ? CREATININE 1.76 (H) 05/31/2021  ? BILITOT 0.3 02/23/2021  ? ALKPHOS 35 (L) 02/23/2021  ? AST 17 02/23/2021  ? ALT 21 02/23/2021  ? PROT 6.6 02/23/2021  ? ALBUMIN 4.2 05/31/2021  ? CALCIUM 9.1 05/31/2021  ? ANIONGAP 11 09/09/2018  ? EGFR 47 (L) 05/31/2021  ? GFR 50.95 (L) 01/10/2019  ? ?Lab Results  ?Component Value Date  ? CHOL 129 02/23/2021  ? ?Lab Results  ?Component Value Date  ? HDL 37 (L) 02/23/2021  ? ?Lab Results  ?Component Value Date  ? [place] 60 02/23/2021  ? ?Lab Results  ?Component Value Date  ? TRIG 196 (H) 02/23/2021  ? ?Lab Results  ?Component Value Date  ? CHOLHDL 3.5 02/23/2021  ? ?Lab Results  ?Component Value Date  ? HGBA1C 6.4 (H) 05/31/2021  ? ? ?   ?Assessment & Plan:  ? ?Michael Ramos was seen today for dizziness. ? ?Diagnoses and all orders for this visit: ? ?Dizziness ?Discussed BPPV as cause of dizziness. Try meclizine as below. Slow movements. Stay well hydrated and eat regularly.  ?-     meclizine (ANTIVERT) 12.5 MG tablet; Take 1-2 tablets (12.5-25 mg total) by mouth 3 (three) times daily as needed for dizziness. ? ?Gastroesophageal reflux disease without esophagitis ?Reports sulfur belching. Discussed to increase protonix  to BID.  ? ? ?Return to office for new or worsening symptoms, or if symptoms persist. Keep follow up appointment with PCP ? ?The patient indicates understanding of these issues and agrees with the plan. ? ?Patty Leitzke Thad Ranger

## 2021-07-15 ENCOUNTER — Other Ambulatory Visit: Payer: Self-pay | Admitting: Cardiology

## 2021-08-23 ENCOUNTER — Ambulatory Visit (INDEPENDENT_AMBULATORY_CARE_PROVIDER_SITE_OTHER): Payer: Medicare Other

## 2021-08-23 ENCOUNTER — Telehealth: Payer: Self-pay | Admitting: Family Medicine

## 2021-08-23 DIAGNOSIS — E119 Type 2 diabetes mellitus without complications: Secondary | ICD-10-CM

## 2021-08-23 DIAGNOSIS — E1169 Type 2 diabetes mellitus with other specified complication: Secondary | ICD-10-CM

## 2021-08-23 NOTE — Progress Notes (Signed)
? ? ?Chronic Care Management ?Pharmacy Note ? ?08/23/2021 ?Name:  Michael Ramos MRN:  [id number] DOB:  1970/06/07 ? ?Summary: ?Comprehensive medication review performed; medication list updated in electronic medical record ? ?Diabetes: Goal on Track (progressing): YES. ?Controlled-A1C 6.8-->6.4%; current treatment: trijardy XR; glipizide  ?Patient couldn't tolerated GLP1 due to dizziness-->Rybelsus stopped ?Continue glipizide ?Continue Trijardy XR ?Tolerating well ?Blood sugars currently at goal--discussed with mom, Katharine Look ?Still having some dizziness, no hypoglycemia, taking meclizine-->encouraged PCP follow up if unresolved ?Current glucose readings: fasting glucose: <165, post prandial glucose: <200 ?Denies hypoglycemic/hyperglycemic symptoms ?Discussed meal planning options and Plate method for healthy eating ?Avoid sugary drinks and desserts ?Incorporate balanced protein, non starchy veggies, 1 serving of carbohydrate with each meal ?Increase water intake ?Increase physical activity as able ?Current exercise: n/a ?LDL at goal <70 (60)--continue current therapy ?Recommended continue trijardy, glipizide ?Collaborated with PCP ? ?Subjective: ?Michael Ramos is an 51 y.o. year old male who is a primary patient of Janora Norlander, DO.  The CCM team was consulted for assistance with disease management and care coordination needs.   ? ?Engaged with patient by telephone for follow up visit in response to provider referral for pharmacy case management and/or care coordination services.  ? ?Consent to Services:  ?The patient was given information about Chronic Care Management services, agreed to services, and gave verbal consent prior to initiation of services.  Please see initial visit note for detailed documentation.  ? ?Patient Care Team: ?Janora Norlander, DO as PCP - General (Family Medicine) ?Arnoldo Lenis, MD as PCP - Cardiology (Cardiology) ?Lavera Guise, [facility] as Pharmacist (Family  Medicine) ? ?Objective: ? ?Lab Results  ?Component Value Date  ? CREATININE 1.76 (H) 05/31/2021  ? CREATININE 1.61 (H) 02/23/2021  ? CREATININE 1.45 (H) 11/19/2020  ? ? ?Lab Results  ?Component Value Date  ? HGBA1C 6.4 (H) 05/31/2021  ? ?Last diabetic Eye exam:  ?Lab Results  ?Component Value Date/Time  ? HMDIABEYEEXA No Retinopathy 07/02/2020 12:00 AM  ?  ?Last diabetic Foot exam: No results found for: HMDIABFOOTEX  ? ?   ?Component Value Date/Time  ? CHOL 129 02/23/2021 1025  ? TRIG 196 (H) 02/23/2021 1025  ? HDL 37 (L) 02/23/2021 1025  ? CHOLHDL 3.5 02/23/2021 1025  ? Donald 60 02/23/2021 1025  ? ? ? ?  Latest Ref Rng & Units 05/31/2021  ? 10:24 AM 02/23/2021  ? 10:25 AM 11/19/2020  ?  2:34 PM  ?Hepatic Function  ?Total Protein 6.0 - 8.5 g/dL  6.6     ?Albumin 4.0 - 5.0 g/dL 4.2   3.9   4.0    ?AST 0 - 40 IU/L  17     ?ALT 0 - 44 IU/L  21     ?Alk Phosphatase 44 - 121 IU/L  35     ?Total Bilirubin 0.0 - 1.2 mg/dL  0.3     ? ? ?Lab Results  ?Component Value Date/Time  ? TSH 1.450 04/29/2019 04:36 PM  ? TSH 2.230 11/15/2018 09:48 AM  ? ? ? ?  Latest Ref Rng & Units 05/11/2020  ? 11:04 AM 04/29/2019  ?  4:36 PM 11/15/2018  ?  9:48 AM  ?CBC  ?WBC 3.4 - 10.8 x10E3/uL 7.4   7.1   8.5    ?Hemoglobin 13.0 - 17.7 g/dL 14.5   14.8   13.9    ?Hematocrit 37.5 - 51.0 % 47.7   44.2   41.6    ?Platelets  150 - 450 x10E3/uL 197   216   216    ? ? ?Lab Results  ?Component Value Date/Time  ? VD25OH 31.7 08/09/2020 09:43 AM  ? VD25OH 13.5 (L) 05/11/2020 11:04 AM  ? ? ?Clinical ASCVD: No  ?The ASCVD Risk score (Arnett DK, et al., 2019) failed to calculate for the following reasons: ?  The valid total cholesterol range is 130 to 320 mg/dL   ? ?Other: (CHADS2VASc if Afib, PHQ9 if depression, MMRC or CAT for COPD, ACT, DEXA) ? ?Social History  ? ?Tobacco Use  ?Smoking Status Never  ?Smokeless Tobacco Never  ? ?BP Readings from Last 3 Encounters:  ?07/08/21 110/76  ?05/31/21 106/77  ?05/20/21 114/70  ? ?Pulse Readings from Last 3 Encounters:   ?07/08/21 87  ?05/31/21 81  ?05/20/21 90  ? ?Wt Readings from Last 3 Encounters:  ?07/08/21 274 lb 2 oz (124.3 kg)  ?05/31/21 278 lb 12.8 oz (126.5 kg)  ?05/20/21 279 lb 6.4 oz (126.7 kg)  ? ? ?Assessment: Review of patient past medical history, allergies, medications, health status, including review of consultants reports, laboratory and other test data, was performed as part of comprehensive evaluation and provision of chronic care management services.  ? ?SDOH:  (Social Determinants of Health) assessments and interventions performed:  ? ? ?CCM Care Plan ? ?Allergies  ?Allergen Reactions  ? Semaglutide Other (See Comments)  ?  DIZZINESS with Rybelsus  ? ? ?Medications Reviewed Today   ? ? Reviewed by Lavera Guise, [facility] (Pharmacist) on 08/23/21 at [place] List Status: <None>  ? ?Medication Order Taking? Sig Documenting Provider Last Dose Status Informant  ?carvedilol (COREG) 25 MG tablet [id number] No TAKE ONE TABLET BY MOUTH TWICE DAILY Branch, Alphonse Guild, MD Taking Active   ?diclofenac Sodium (VOLTAREN) 1 % GEL [id number] No Voltaren 1 % topical gel [provider] Taking Active   ?Empagliflozin-Linaglip-Metform (TRIJARDY XR) 25-08-998 MG TB24 [id number] No Take 1 tablet by mouth daily. STOP RYBELSUS AND SYNJARDY Janora Norlander, DO Taking Active   ?ENTRESTO 49-51 MG [id number] No TAKE ONE TABLET BY MOUTH TWICE DAILY Branch, Alphonse Guild, MD Taking Active   ?furosemide (LASIX) 20 MG tablet [id number]  TAKE ONE TABLET BY MOUTH DAILY AS NEEDED Branch, Alphonse Guild, MD  Active   ?glipiZIDE (GLUCOTROL) 5 MG tablet [id number] No Take 1 tablet (5 mg total) by mouth 2 (two) times daily before a meal. Ronnie Doss M, DO Taking Active   ?loratadine (CLARITIN) 10 MG tablet [id number] No TAKE ONE (1) TABLET EACH DAY [place], [place] M, DO Taking Active   ?meclizine (ANTIVERT) 12.5 MG tablet [id number]  Take 1-2 tablets (12.5-25 mg total) by mouth 3 (three) times daily as needed for dizziness. Gwenlyn Perking,  FNP  Active   ?pantoprazole (PROTONIX) 40 MG tablet [id number] No Take 1 tablet (40 mg total) by mouth 2 (two) times daily before a meal. As directed by Dr Arley Phen[place], Koleen Distance, DO Taking Active   ?potassium chloride SA (KLOR-CON M) 20 MEQ tablet [id number]  TAKE TWO TABLETS BY MOUTH DAILY Arnoldo Lenis, MD  Active   ?pravastatin (PRAVACHOL) 10 MG tablet [id number] No TAKE ONE (1) TABLET EACH DAY [place], [place], DO Taking Active   ? ?  ?  ? ?  ? ? ?Patient Active Problem List  ? Diagnosis Date Noted  ? Hyperlipidemia associated with type 2 diabetes mellitus ([place]) 10/31/2019  ? Congestive heart failure ([place]) 12/22/2017  ? Low back pain 12/22/2017  ?  Pain of left hip joint 12/22/2017  ? Obesity (BMI 30-39.9) 10/23/2017  ? Chronic systolic heart failure ([place])   ? Non-ischemic cardiomyopathy ([place])   ? Dilated cardiomyopathy ([place]) 08/28/2016  ? Acute on chronic renal insufficiency 08/28/2016  ? Essential hypertension 08/25/2016  ? Acute systolic CHF (congestive heart failure) ([place]) 08/18/2016  ? Shortness of breath 08/15/2016  ? Type 2 diabetes mellitus without complication, without long-term current use of insulin ([place]) 03/14/2016  ? Chronic nonseasonal allergic rhinitis due to pollen 03/14/2016  ? Autism 03/14/2016  ? ? ?Immunization History  ?Administered Date(s) Administered  ? Influenza,inj,Quad PF,6+ Mos 01/18/2016, 01/10/2017, 01/31/2018, 02/09/2020  ? Influenza-Unspecified 01/23/2021  ? PFIZER(Purple Top)SARS-COV-2 Vaccination 07/17/2019, 08/08/2019, 02/29/2020  ? Pneumococcal Polysaccharide-23 01/10/2017  ? Tdap 02/09/2020  ? ? ?Conditions to be addressed/monitored: ?HLD and DMII ? ?Care Plan : PHARMD MEDICATION MANAGEMENT  ?Updates made by Lavera Guise, RPH since 08/23/2021 12:00 AM  ?  ? ?Problem: DISEASE PROGRESSION PREVENTION   ?  ? ?Long-Range Goal: T2DM   ?Recent Progress: Not on track  ?Priority: High  ?Note:   ?Current Barriers:  ?Unable to achieve control of T2DM  ?Suboptimal therapeutic  regimen for T2DM ? ?Pharmacist Clinical Goal(s):  ?patient will achieve & maintain control of T2DM as evidenced by IMPROVED GLYCEMIC CONTROL through collaboration with PharmD and provider.  ? ?Interventions: ?1:1 collabo

## 2021-08-23 NOTE — Telephone Encounter (Signed)
Returned call ?Patient's mom reported dizziness  ?Patient having to take vertigo meds/meclizine ?

## 2021-08-23 NOTE — Patient Instructions (Addendum)
Visit Information ? ?Following are the goals we discussed today:  ?Current Barriers:  ?Unable to achieve control of T2DM  ?Suboptimal therapeutic regimen for T2DM ? ?Pharmacist Clinical Goal(s):  ?patient will achieve & maintain control of T2DM as evidenced by IMPROVED GLYCEMIC CONTROL through collaboration with PharmD and provider.  ? ?Interventions: ?1:1 collaboration with Janora Norlander, DO regarding development and update of comprehensive plan of care as evidenced by provider attestation and co-signature ?Inter-disciplinary care team collaboration (see longitudinal plan of care) ?Comprehensive medication review performed; medication list updated in electronic medical record ? ?Diabetes: Goal on Track (progressing): YES. ?Controlled-A1C 6.8-->6.4%; current treatment: trijardy XR; glipizide  ?Patient couldn't tolerated GLP1 due to dizziness-->Rybelsus stopped ?Continue glipizide ?Continue Trijardy XR ?Tolerating well ?Blood sugars currently at goal--discussed with mom, Katharine Look ?Still having some dizziness, no hypoglycemia, taking meclizine-->encouraged PCP follow up if unresolved ?Current glucose readings: fasting glucose: <165, post prandial glucose: <200 ?Denies hypoglycemic/hyperglycemic symptoms ?Discussed meal planning options and Plate method for healthy eating ?Avoid sugary drinks and desserts ?Incorporate balanced protein, non starchy veggies, 1 serving of carbohydrate with each meal ?Increase water intake ?Increase physical activity as able ?Current exercise: n/a ?LDL at goal <70 (60)--continue current therapy ?Recommended continue trijardy, glipizide ?Collaborated with PCP ? ? ?Patient Goals/Self-Care Activities ?patient will:  ?- take medications as prescribed as evidenced by patient report and record review ?check glucose DAILY, document, and provide at future appointments ?engage in dietary modifications by following HEART HEALTHY diet--WILL ALSO BE BENEFICIAL FOR CHOLESTEROL ? ? ?Plan: The  patient has been provided with contact information for the care management team and has been advised to call with any health related questions or concerns.  ? ?Signature ?Regina Eck, PharmD, BCPS ?Clinical Pharmacist, [facility] ?[place]  II Phone [phone] ? ? ?Please call the care guide team at [phone] if you need to cancel or reschedule your appointment.  ? ?Patient verbalizes understanding of instructions and care plan provided today and agrees to view in [place]. Active MyChart status confirmed with patient.   ? ?

## 2021-08-26 ENCOUNTER — Encounter: Payer: Self-pay | Admitting: Family Medicine

## 2021-08-26 ENCOUNTER — Ambulatory Visit: Payer: Medicare Other | Admitting: Family Medicine

## 2021-08-29 ENCOUNTER — Ambulatory Visit (INDEPENDENT_AMBULATORY_CARE_PROVIDER_SITE_OTHER): Payer: Medicare Other

## 2021-08-29 ENCOUNTER — Encounter: Payer: Self-pay | Admitting: Family Medicine

## 2021-08-29 ENCOUNTER — Ambulatory Visit (INDEPENDENT_AMBULATORY_CARE_PROVIDER_SITE_OTHER): Payer: Medicare Other | Admitting: Family Medicine

## 2021-08-29 VITALS — BP 105/57 | HR 85 | Temp 97.5°F | Ht 75.0 in | Wt 273.5 lb

## 2021-08-29 DIAGNOSIS — N1831 Chronic kidney disease, stage 3a: Secondary | ICD-10-CM | POA: Diagnosis not present

## 2021-08-29 DIAGNOSIS — I10 Essential (primary) hypertension: Secondary | ICD-10-CM | POA: Diagnosis not present

## 2021-08-29 DIAGNOSIS — M25552 Pain in left hip: Secondary | ICD-10-CM

## 2021-08-29 NOTE — Patient Instructions (Signed)
Hip Pain The hip is the joint between the upper legs and the lower pelvis. The bones, cartilage, tendons, and muscles of your hip joint support your body and allow you to move around. Hip pain can range from a minor ache to severe pain in one or both of your hips. The pain may be felt on the inside of the hip joint near the groin, or on the outside near the buttocks and upper thigh. You may also have swelling or stiffness in your hip area. Follow these instructions at home: Managing pain, stiffness, and swelling     If directed, put ice on the painful area. To do this: Put ice in a plastic bag. Place a towel between your skin and the bag. Leave the ice on for 20 minutes, 2-3 times a day. If directed, apply heat to the affected area as often as told by your health care provider. Use the heat source that your health care provider recommends, such as a moist heat pack or a heating pad. Place a towel between your skin and the heat source. Leave the heat on for 20-30 minutes. Remove the heat if your skin turns bright red. This is especially important if you are unable to feel pain, heat, or cold. You may have a greater risk of getting burned. Activity Do exercises as told by your health care provider. Avoid activities that cause pain. General instructions  Take over-the-counter and prescription medicines only as told by your health care provider. Keep a journal of your symptoms. Write down: How often you have hip pain. The location of your pain. What the pain feels like. What makes the pain worse. Sleep with a pillow between your legs on your most comfortable side. Keep all follow-up visits as told by your health care provider. This is important. Contact a health care provider if: You cannot put weight on your leg. Your pain or swelling continues or gets worse after one week. It gets harder to walk. You have a fever. Get help right away if: You fall. You have a sudden increase in pain  and swelling in your hip. Your hip is red or swollen or very tender to touch. Summary Hip pain can range from a minor ache to severe pain in one or both of your hips. The pain may be felt on the inside of the hip joint near the groin, or on the outside near the buttocks and upper thigh. Avoid activities that cause pain. Write down how often you have hip pain, the location of the pain, what makes it worse, and what it feels like. This information is not intended to replace advice given to you by your health care provider. Make sure you discuss any questions you have with your health care provider. Document Revised: 08/12/2018 Document Reviewed: 08/12/2018 Elsevier Patient Education  2023 Elsevier Inc.  

## 2021-08-29 NOTE — Progress Notes (Signed)
   Acute Office Visit  Subjective:     Patient ID: Michael Ramos, male    DOB: 1971-02-24, 51 y.o.   MRN: [id number]  Chief Complaint  Patient presents with   Hip Pain    Hip Pain   Here with sister today who helped provided the history due to Fankie's autism. It is difficult to obtain a complete history. Patient is in today for left hip pain x 1 week. It is mild. It hurts most of the time. He has been taking advil for the pain. There has not been any injury. He previously did have a fracture in one of his hips years ago. Unsure which hip. He was seen by [facility] ortho and was given an injection in to the hip. He has not had any issues with the hip since then. He has known arthritis in his right hip.   ROS As per HPI.      Objective:    BP (!) 105/57   Pulse 85   Temp (!) 97.5 F (36.4 C) (Temporal)   Ht '6\' 3"'$  (1.905 m)   Wt 273 lb 8 oz (124.1 kg)   BMI 34.19 kg/m    Physical Exam Vitals and nursing note reviewed.  Constitutional:      General: He is not in acute distress.    Appearance: He is not ill-appearing, toxic-appearing or diaphoretic.  Pulmonary:     Effort: Pulmonary effort is normal. No respiratory distress.  Musculoskeletal:        General: No swelling.     Left hip: No deformity, tenderness or bony tenderness.     Right lower leg: No edema.     Left lower leg: No edema.  Skin:    General: Skin is warm and dry.  Neurological:     Mental Status: He is alert and oriented to person, place, and time. Mental status is at baseline.     Gait: Gait abnormal (antalgic, using cane).    No results found for any visits on 08/29/21.      Assessment & Plan:   Maximiliano was seen today for hip pain.  Diagnoses and all orders for this visit:  Pain in left hip ? Arthritis. Known arthritis in right hip. Xray today, report pending. Discussed tylenol, ice, heat, rest, voltaren gel. Limit NSAIDS due to CKD and cardiac history. Last GFR was 47. BP well controlled  today. Discussed referral to ortho pending xray report. -     DG Hip Unilat W OR W/O Pelvis 2-3 Views Left; Future  Stage 3a chronic kidney disease (HCC) Last GFR 47.  Essential hypertension BP at goal today.   Return if symptoms worsen or fail to improve.  Gwenlyn Perking, FNP

## 2021-08-31 ENCOUNTER — Other Ambulatory Visit: Payer: Self-pay | Admitting: Family Medicine

## 2021-08-31 DIAGNOSIS — M25551 Pain in right hip: Secondary | ICD-10-CM

## 2021-08-31 DIAGNOSIS — M25552 Pain in left hip: Secondary | ICD-10-CM

## 2021-09-07 DIAGNOSIS — E785 Hyperlipidemia, unspecified: Secondary | ICD-10-CM

## 2021-09-07 DIAGNOSIS — E1169 Type 2 diabetes mellitus with other specified complication: Secondary | ICD-10-CM

## 2021-09-07 DIAGNOSIS — E119 Type 2 diabetes mellitus without complications: Secondary | ICD-10-CM

## 2021-09-13 ENCOUNTER — Other Ambulatory Visit: Payer: Self-pay | Admitting: Cardiology

## 2021-09-19 DIAGNOSIS — D2239 Melanocytic nevi of other parts of face: Secondary | ICD-10-CM | POA: Diagnosis not present

## 2021-09-19 DIAGNOSIS — L308 Other specified dermatitis: Secondary | ICD-10-CM | POA: Diagnosis not present

## 2021-09-20 ENCOUNTER — Ambulatory Visit (HOSPITAL_COMMUNITY)
Admission: RE | Admit: 2021-09-20 | Discharge: 2021-09-20 | Disposition: A | Discharge status: Home / Self Care | Payer: Medicare Other | Source: Ambulatory Visit | Attending: Family Medicine | Admitting: Family Medicine

## 2021-09-20 DIAGNOSIS — S73191A Other sprain of right hip, initial encounter: Secondary | ICD-10-CM | POA: Diagnosis not present

## 2021-09-20 DIAGNOSIS — M25852 Other specified joint disorders, left hip: Secondary | ICD-10-CM | POA: Diagnosis not present

## 2021-09-20 DIAGNOSIS — M160 Bilateral primary osteoarthritis of hip: Secondary | ICD-10-CM | POA: Diagnosis not present

## 2021-09-20 DIAGNOSIS — S73192A Other sprain of left hip, initial encounter: Secondary | ICD-10-CM | POA: Diagnosis not present

## 2021-09-20 DIAGNOSIS — M5136 Other intervertebral disc degeneration, lumbar region: Secondary | ICD-10-CM | POA: Diagnosis not present

## 2021-09-20 DIAGNOSIS — M25552 Pain in left hip: Secondary | ICD-10-CM

## 2021-09-20 DIAGNOSIS — M25551 Pain in right hip: Secondary | ICD-10-CM | POA: Insufficient documentation

## 2021-09-20 DIAGNOSIS — K5730 Diverticulosis of large intestine without perforation or abscess without bleeding: Secondary | ICD-10-CM | POA: Diagnosis not present

## 2021-10-12 ENCOUNTER — Other Ambulatory Visit: Payer: Self-pay | Admitting: Cardiology

## 2021-10-12 ENCOUNTER — Other Ambulatory Visit: Payer: Self-pay | Admitting: Family Medicine

## 2021-10-12 DIAGNOSIS — E119 Type 2 diabetes mellitus without complications: Secondary | ICD-10-CM

## 2021-10-26 DIAGNOSIS — M25551 Pain in right hip: Secondary | ICD-10-CM | POA: Diagnosis not present

## 2021-10-26 DIAGNOSIS — M545 Low back pain, unspecified: Secondary | ICD-10-CM | POA: Diagnosis not present

## 2021-10-26 DIAGNOSIS — M25552 Pain in left hip: Secondary | ICD-10-CM | POA: Diagnosis not present

## 2021-11-09 ENCOUNTER — Other Ambulatory Visit: Payer: Self-pay | Admitting: Family Medicine

## 2021-11-09 DIAGNOSIS — R42 Dizziness and giddiness: Secondary | ICD-10-CM

## 2021-11-16 DIAGNOSIS — H1045 Other chronic allergic conjunctivitis: Secondary | ICD-10-CM | POA: Diagnosis not present

## 2021-11-16 DIAGNOSIS — H0102B Squamous blepharitis left eye, upper and lower eyelids: Secondary | ICD-10-CM | POA: Diagnosis not present

## 2021-11-16 DIAGNOSIS — E119 Type 2 diabetes mellitus without complications: Secondary | ICD-10-CM | POA: Diagnosis not present

## 2021-11-16 DIAGNOSIS — H0288A Meibomian gland dysfunction right eye, upper and lower eyelids: Secondary | ICD-10-CM | POA: Diagnosis not present

## 2021-11-16 DIAGNOSIS — H0288B Meibomian gland dysfunction left eye, upper and lower eyelids: Secondary | ICD-10-CM | POA: Diagnosis not present

## 2021-11-16 DIAGNOSIS — H0102A Squamous blepharitis right eye, upper and lower eyelids: Secondary | ICD-10-CM | POA: Diagnosis not present

## 2021-11-16 LAB — HM DIABETES EYE EXAM

## 2021-11-22 ENCOUNTER — Ambulatory Visit (INDEPENDENT_AMBULATORY_CARE_PROVIDER_SITE_OTHER): Payer: Medicare Other | Admitting: Family Medicine

## 2021-11-22 ENCOUNTER — Encounter: Payer: Self-pay | Admitting: Family Medicine

## 2021-11-22 VITALS — Temp 97.6°F | Ht 75.0 in | Wt 273.4 lb

## 2021-11-22 DIAGNOSIS — E119 Type 2 diabetes mellitus without complications: Secondary | ICD-10-CM | POA: Diagnosis not present

## 2021-11-22 DIAGNOSIS — I152 Hypertension secondary to endocrine disorders: Secondary | ICD-10-CM | POA: Diagnosis not present

## 2021-11-22 DIAGNOSIS — E785 Hyperlipidemia, unspecified: Secondary | ICD-10-CM | POA: Diagnosis not present

## 2021-11-22 DIAGNOSIS — E1159 Type 2 diabetes mellitus with other circulatory complications: Secondary | ICD-10-CM | POA: Diagnosis not present

## 2021-11-22 DIAGNOSIS — E1169 Type 2 diabetes mellitus with other specified complication: Secondary | ICD-10-CM | POA: Diagnosis not present

## 2021-11-22 DIAGNOSIS — I5022 Chronic systolic (congestive) heart failure: Secondary | ICD-10-CM

## 2021-11-22 DIAGNOSIS — N1831 Chronic kidney disease, stage 3a: Secondary | ICD-10-CM

## 2021-11-22 DIAGNOSIS — R2689 Other abnormalities of gait and mobility: Secondary | ICD-10-CM

## 2021-11-22 DIAGNOSIS — E1122 Type 2 diabetes mellitus with diabetic chronic kidney disease: Secondary | ICD-10-CM | POA: Diagnosis not present

## 2021-11-22 DIAGNOSIS — I428 Other cardiomyopathies: Secondary | ICD-10-CM | POA: Diagnosis not present

## 2021-11-22 DIAGNOSIS — R42 Dizziness and giddiness: Secondary | ICD-10-CM | POA: Diagnosis not present

## 2021-11-22 LAB — BAYER DCA HB A1C WAIVED: HB A1C (BAYER DCA - WAIVED): 6.2 % — ABNORMAL HIGH (ref 4.8–5.6)

## 2021-11-22 NOTE — Progress Notes (Signed)
Subjective: CC:DM PCP: Janora Norlander, DO WER:[id number] Michael Ramos is a 51 y.o. male presenting to clinic today for:  1. Type 2 Diabetes with hypertension, hyperlipidemia:  With all medications.  Last eye exam: UTD Last foot exam: UTD Last A1c:  Lab Results  Component Value Date   HGBA1C 6.4 (H) 05/31/2021   Nephropathy screen indicated?: UTD Last flu, zoster and/or pneumovax:  Immunization History  Administered Date(s) Administered   Influenza,inj,Quad PF,6+ Mos 01/18/2016, 01/10/2017, 01/31/2018, 02/09/2020   Influenza-Unspecified 01/23/2021   PFIZER(Purple Top)SARS-COV-2 Vaccination 07/17/2019, 08/08/2019, 02/29/2020   Pneumococcal Polysaccharide-23 01/10/2017   Tdap 02/09/2020    ROS: Reports dizziness but no chest pain, shortness of breath or edema.  No hypoglycemic episodes reported  2.  Vertigo Patient with ongoing and intermittent vertigo.  The vertigo symptoms seem to be more frequent and more severe as of late.  His sister reports that she often has to accompany him when he has the spells because he cannot walk in a straight line.  He often will wake up in the middle the night to take meclizine because he has spells of dizziness at night.  This does induce some nausea.  Both blood sugars and blood pressures have been controlled.  No reports of sinus symptoms.  Has not been swimming.  Hydrating without difficulty    ROS: Per HPI  Allergies  Allergen Reactions   Semaglutide Other (See Comments)    DIZZINESS with Rybelsus   Past Medical History:  Diagnosis Date   Allergy    Autism    CHF (congestive heart failure) ([place])    a. EF 20-25% by echo in 08/2016 b. repeat echo in 02/2017 showing EF 30-35% with cath showing normal cors.    Diabetes mellitus without complication (HCC)    Obesity (BMI 30-39.9) 10/23/2017    Current Outpatient Medications:    carvedilol (COREG) 25 MG tablet, TAKE ONE TABLET BY MOUTH TWICE DAILY, Disp: 180 tablet, Rfl: 3   diclofenac  Sodium (VOLTAREN) 1 % GEL, Voltaren 1 % topical gel, Disp: , Rfl:    Empagliflozin-Linaglip-Metform (TRIJARDY XR) 25-08-998 MG TB24, Take 1 tablet by mouth daily. STOP RYBELSUS AND SYNJARDY, Disp: 30 tablet, Rfl: 12   ENTRESTO 49-51 MG, TAKE ONE TABLET BY MOUTH TWICE DAILY, Disp: 60 tablet, Rfl: 3   furosemide (LASIX) 20 MG tablet, TAKE ONE TABLET BY MOUTH DAILY AS NEEDED, Disp: 90 tablet, Rfl: 1   glipiZIDE (GLUCOTROL) 5 MG tablet, TAKE 1 TABLET BY MOUTH TWICE DAILY BEFORE MEALS, Disp: 180 tablet, Rfl: 0   loratadine (CLARITIN) 10 MG tablet, TAKE ONE (1) TABLET EACH DAY, Disp: 90 tablet, Rfl: 3   meclizine (ANTIVERT) 12.5 MG tablet, TAKE 1-2 TABLETS BY MOUTH 3 TIMES DAILY AS NEEDED FOR DIZZINESS, Disp: 30 tablet, Rfl: 1   pantoprazole (PROTONIX) 40 MG tablet, Take 1 tablet (40 mg total) by mouth 2 (two) times daily before a meal. As directed by Dr Darnell Level, Disp: 180 tablet, Rfl: 2   potassium chloride SA (KLOR-CON M) 20 MEQ tablet, TAKE TWO TABLETS BY MOUTH DAILY, Disp: 180 tablet, Rfl: 1   pravastatin (PRAVACHOL) 10 MG tablet, TAKE ONE (1) TABLET EACH DAY, Disp: 30 tablet, Rfl: 5 Social History   Socioeconomic History   Marital status: Single    Spouse name: Not on file   Number of children: Not on file   Years of education: Not on file   Highest education level: 12th grade  Occupational History   Occupation: Disabled  Tobacco  Use   Smoking status: Never   Smokeless tobacco: Never  Vaping Use   Vaping Use: Never used  Substance and Sexual Activity   Alcohol use: No   Drug use: No   Sexual activity: Not Currently  Other Topics Concern   Not on file  Social History Narrative   Autistic   Lives with Mother   Social Determinants of Health   Financial Resource Strain: Low Risk  (02/04/2021)   Overall Financial Resource Strain (CARDIA)    Difficulty of Paying Living Expenses: Not hard at all  Food Insecurity: No Food Insecurity (02/04/2021)   Hunger Vital Sign    Worried About Running  Out of Food in the Last Year: Never true    Ran Out of Food in the Last Year: Never true  Transportation Needs: No Transportation Needs (02/04/2021)   PRAPARE - Hydrologist (Medical): No    Lack of Transportation (Non-Medical): No  Physical Activity: Insufficiently Active (02/04/2021)   Exercise Vital Sign    Days of Exercise per Week: 7 days    Minutes of Exercise per Session: 20 min  Stress: No Stress Concern Present (02/04/2021)   [place]    Feeling of Stress : Only a little  Social Connections: Socially Isolated (02/04/2021)   Social Connection and Isolation Panel [NHANES]    Frequency of Communication with Friends and Family: More than three times a week    Frequency of Social Gatherings with Friends and Family: More than three times a week    Attends Religious Services: Never    Marine scientist or Organizations: No    Attends Archivist Meetings: Never    Marital Status: Never married  Intimate Partner Violence: Not At Risk (02/04/2021)   Humiliation, Afraid, Rape, and Kick questionnaire    Fear of Current or Ex-Partner: No    Emotionally Abused: No    Physically Abused: No    Sexually Abused: No   Family History  Problem Relation Age of Onset   Diabetes Mother    Diabetes Father    Bladder Cancer Father    Colon cancer Neg Hx    Esophageal cancer Neg Hx    Liver cancer Neg Hx    Stomach cancer Neg Hx     Objective: Office vital signs reviewed. Temp 97.6 F (36.4 C)   Ht '6\' 3"'$  (1.905 m)   Wt 273 lb 6.4 oz (124 kg)   SpO2 94%   BMI 34.17 kg/m   Physical Examination:  General: Awake, alert, nontoxic male, No acute distress HEENT: TMs intact bilaterally.  No obstruction.  No bulging.  Sinuses without any significant drainage.  Oropharynx without erythema Cardio: regular rate and rhythm, S1S2 heard, no murmurs appreciated Pulm: clear to auscultation  bilaterally, no wheezes, rhonchi or rales; normal work of breathing on room air MSK: Ambulating with minimal assistance Neuro: Mild sway with Romberg.  PERRLA, EOMI.  Cognition delayed but this is baseline  Orthostatic Vitals for the past 48 hrs (Last 6 readings):  Orthostatic BP Orthostatic Pulse  11/22/21 1119 106/69 87  11/22/21 1135 110/75 55     Assessment/ Plan: 51 y.o. male   Type 2 diabetes mellitus with stage 3a chronic kidney disease, without long-term current use of insulin (HCC) - Plan: Bayer DCA Hb A1c Waived, Renal Function Panel  Hypertension associated with diabetes ([place])  Hyperlipidemia associated with type 2 diabetes mellitus ([place])  Non-ischemic cardiomyopathy (HCC)  Chronic systolic heart failure (HCC)  Balance problem - Plan: Ambulatory referral to ENT, MR Brain Wo Contrast  Recurrent vertigo - Plan: Ambulatory referral to ENT, MR Brain Wo Contrast  Blood sugar remains under excellent control with A1c below 6.5 today.  Blood pressure remains controlled and there is no appreciable orthostasis on exam  Continue cholesterol-lowering medications  No evidence of fluid overload  Uncertain etiology of this balance problem and recurrent vertigo.  For this reason I am going to obtain MRI of the brain and refer to ENT to further evaluate as it is becoming more frequent and more severe.  Orders Placed This Encounter  Procedures   Bayer DCA Hb A1c Waived   Renal Function Panel   No orders of the defined types were placed in this encounter.    Janora Norlander, DO [place] [phone]

## 2021-11-23 LAB — RENAL FUNCTION PANEL
Albumin: 4 g/dL — ABNORMAL LOW (ref 4.1–5.1)
BUN/Creatinine Ratio: 14 (ref 9–20)
BUN: 24 mg/dL (ref 6–24)
CO2: 21 mmol/L (ref 20–29)
Calcium: 8.8 mg/dL (ref 8.7–10.2)
Chloride: 102 mmol/L (ref 96–106)
Creatinine, Ser: 1.71 mg/dL — ABNORMAL HIGH (ref 0.76–1.27)
Glucose: 237 mg/dL — ABNORMAL HIGH (ref 70–99)
Phosphorus: 2.8 mg/dL (ref 2.8–4.1)
Potassium: 4.7 mmol/L (ref 3.5–5.2)
Sodium: 140 mmol/L (ref 134–144)
eGFR: 48 mL/min/{1.73_m2} — ABNORMAL LOW (ref >59)

## 2021-11-25 ENCOUNTER — Ambulatory Visit (INDEPENDENT_AMBULATORY_CARE_PROVIDER_SITE_OTHER): Payer: Medicare Other | Admitting: Cardiology

## 2021-11-25 ENCOUNTER — Encounter: Payer: Self-pay | Admitting: Cardiology

## 2021-11-25 VITALS — BP 100/62 | HR 86 | Ht 75.0 in | Wt 273.8 lb

## 2021-11-25 DIAGNOSIS — E782 Mixed hyperlipidemia: Secondary | ICD-10-CM | POA: Diagnosis not present

## 2021-11-25 DIAGNOSIS — I5022 Chronic systolic (congestive) heart failure: Secondary | ICD-10-CM

## 2021-11-25 MED ORDER — SACUBITRIL-VALSARTAN 24-26 MG PO TABS: 1.0000 | ORAL_TABLET | Freq: Two times a day (BID) | ORAL | 6 refills | Status: AC

## 2021-11-25 NOTE — Progress Notes (Signed)
Clinical Summary Michael Ramos is a 51 y.o.male seen today for follow up of the following medical problems.      1. Chronic systolic HF/NICM - new diagnosis during 08/2016 admission with fluid overload - 02/2017 cath no significant CAD.     07/2017 echo LVEF 35-40%, grade II diastolic dysfunction     09/6597 echo: LVEF 50-55%, no WMAs   - no SOB/DOE. Has had some recent dizziness, nausea. Being worked up for vertigo - no recent edema. Lasix written as prn but appears to be taking daily.        2. Nocturnal hypoxia/Possible sleep apnea - followed by Dr Radford Pax - drop in O2 sats noted during home oximetry. There were discussions about a formal sleep study however family does not feel patient could cooperate with study due to his autism     3. Autism   4. CKD    5. Hyperlipidemia -02/2021 TC 129 TG 196 HDL 37 LDL 60  6. Dizziness - recent workup dizziness.      SH: Has 7 brothers and sisters.    Past Medical History:  Diagnosis Date   Allergy    Autism    CHF (congestive heart failure) ([place])    a. EF 20-25% by echo in 08/2016 b. repeat echo in 02/2017 showing EF 30-35% with cath showing normal cors.    Diabetes mellitus without complication (HCC)    Obesity (BMI 30-39.9) 10/23/2017     Allergies  Allergen Reactions   Semaglutide Other (See Comments)    DIZZINESS with Rybelsus     Current Outpatient Medications  Medication Sig Dispense Refill   carvedilol (COREG) 25 MG tablet TAKE ONE TABLET BY MOUTH TWICE DAILY 180 tablet 3   diclofenac Sodium (VOLTAREN) 1 % GEL Voltaren 1 % topical gel     Empagliflozin-Linaglip-Metform (TRIJARDY XR) 25-08-998 MG TB24 Take 1 tablet by mouth daily. STOP RYBELSUS AND SYNJARDY 30 tablet 12   ENTRESTO 49-51 MG TAKE ONE TABLET BY MOUTH TWICE DAILY 60 tablet 3   furosemide (LASIX) 20 MG tablet TAKE ONE TABLET BY MOUTH DAILY AS NEEDED 90 tablet 1   glipiZIDE (GLUCOTROL) 5 MG tablet TAKE 1 TABLET BY MOUTH TWICE DAILY BEFORE MEALS  180 tablet 0   loratadine (CLARITIN) 10 MG tablet TAKE ONE (1) TABLET EACH DAY 90 tablet 3   meclizine (ANTIVERT) 12.5 MG tablet TAKE 1-2 TABLETS BY MOUTH 3 TIMES DAILY AS NEEDED FOR DIZZINESS 30 tablet 1   pantoprazole (PROTONIX) 40 MG tablet Take 1 tablet (40 mg total) by mouth 2 (two) times daily before a meal. As directed by Dr Darnell Level 180 tablet 2   potassium chloride SA (KLOR-CON M) 20 MEQ tablet TAKE TWO TABLETS BY MOUTH DAILY 180 tablet 1   pravastatin (PRAVACHOL) 10 MG tablet TAKE ONE (1) TABLET EACH DAY 30 tablet 5   No current facility-administered medications for this visit.     Past Surgical History:  Procedure Laterality Date   RIGHT/LEFT HEART CATH AND CORONARY ANGIOGRAPHY N/A 03/06/2017   Procedure: RIGHT/LEFT HEART CATH AND CORONARY ANGIOGRAPHY;  Surgeon: Burnell Blanks, MD;  Location: [facility];  Service: Cardiovascular;  Laterality: N/A;     Allergies  Allergen Reactions   Semaglutide Other (See Comments)    DIZZINESS with Rybelsus      Family History  Problem Relation Age of Onset   Diabetes Mother    Diabetes Father    Bladder Cancer Father    Colon cancer  Neg Hx    Esophageal cancer Neg Hx    Liver cancer Neg Hx    Stomach cancer Neg Hx      Social History Mr. Spangler reports that he has never smoked. He has never used smokeless tobacco. Mr. Simones reports no history of alcohol use.   Review of Systems CONSTITUTIONAL: No weight loss, fever, chills, weakness or fatigue.  HEENT: Eyes: No visual loss, blurred vision, double vision or yellow sclerae.No hearing loss, sneezing, congestion, runny nose or sore throat.  SKIN: No rash or itching.  CARDIOVASCULAR: per hpi RESPIRATORY: No shortness of breath, cough or sputum.  GASTROINTESTINAL: No anorexia, nausea, vomiting or diarrhea. No abdominal pain or blood.  GENITOURINARY: No burning on urination, no polyuria NEUROLOGICAL: No headache, dizziness, syncope, paralysis, ataxia, numbness or  tingling in the extremities. No change in bowel or bladder control.  MUSCULOSKELETAL: No muscle, back pain, joint pain or stiffness.  LYMPHATICS: No enlarged nodes. No history of splenectomy.  PSYCHIATRIC: No history of depression or anxiety.  ENDOCRINOLOGIC: No reports of sweating, cold or heat intolerance. No polyuria or polydipsia.  Marland Kitchen   Physical Examination Today's Vitals   11/25/21 1445  BP: 100/62  Pulse: 86  SpO2: 96%  Weight: 273 lb 12.8 oz (124.2 kg)  Height: '6\' 3"'$  (1.905 m)   Body mass index is 34.22 kg/m.  Gen: resting comfortably, no acute distress HEENT: no scleral icterus, pupils equal round and reactive, no palptable cervical adenopathy,  CV: RRR, no m/r/g no jvd Resp: Clear to auscultation bilaterally GI: abdomen is soft, non-tender, non-distended, normal bowel sounds, no hepatosplenomegaly MSK: extremities are warm, no edema.  Skin: warm, no rash Neuro:  no focal deficits Psych: appropriate affect   Diagnostic Studies 02/2017 cath Hemodynamic findings consistent with mild pulmonary hypertension.   1. No angiographic evidence of CAD 2. Non-ischemic cardiomyopathy   Recommendations: medical management of cardiomyopathy and CHF.      02/2017 echo Study Conclusions   - Left ventricle: The cavity size was mildly dilated. Wall   thickness was normal. Systolic function was moderately to   severely reduced. The estimated ejection fraction was in the   range of 30% to 35%. Diffuse hypokinesis. Diastolic dysfunction,   grade indeterminate. Indeterminate filling pressures. - Mitral valve: There was mild regurgitation.     07/2017 echo Study Conclusions   - Left ventricle: The cavity size was mildly dilated. Wall   thickness was normal. Systolic function was moderately reduced.   The estimated ejection fraction was in the range of 35% to 40%.   Diffuse hypokinesis. Features are consistent with a pseudonormal   left ventricular filling pattern, with  concomitant abnormal   relaxation and increased filling pressure (grade 2 diastolic   dysfunction). - Mitral valve: There was mild regurgitation.     09/2019 echo 1. Left ventricular ejection fraction, by estimation, is 50 to 55%. The  left ventricle has normal function. The left ventricle has no regional  wall motion abnormalities. Left ventricular diastolic parameters were  normal.   2. Right ventricular systolic function is normal. The right ventricular  size is normal. There is normal pulmonary artery systolic pressure.   3. The mitral valve is normal in structure. No evidence of mitral valve  regurgitation. No evidence of mitral stenosis.   4. The aortic valve was not well visualized. Aortic valve regurgitation  is not visualized. No aortic stenosis is present.   5. The inferior vena cava is normal in size with greater than  50%  respiratory variability, suggesting right atrial pressure of 3 mmHg.    Assessment and Plan  1. Chronic systolic HF/ NICM - medical therapy limited by his renal function and soft bp's - by most recent echo LVEF has noramlzied -low normal bp's, recent dizziness difficult to discern etiology as limited historian. Being worked up for vertigo. With low bp will lower his entresto to 24/'26mg'$  bid see if helps at all     2.Hyperlipidemia -at goal, continue current meds      Arnoldo Lenis, M.D.

## 2021-11-25 NOTE — Patient Instructions (Signed)
Medication Instructions:  Your physician has recommended you make the following change in your medication:  Decrease Entresto to 24-26 mg tablets twice daily   Labwork: None  Testing/Procedures: None  Follow-Up: Follow up with Dr Harl Bowie in 3 months.   Any Other Special Instructions Will Be Listed Below (If Applicable).     If you need a refill on your cardiac medications before your next appointment, please call your pharmacy.

## 2021-12-09 ENCOUNTER — Other Ambulatory Visit: Payer: Self-pay | Admitting: Family Medicine

## 2021-12-09 ENCOUNTER — Other Ambulatory Visit: Payer: Self-pay | Admitting: Cardiology

## 2021-12-09 DIAGNOSIS — J3089 Other allergic rhinitis: Secondary | ICD-10-CM

## 2021-12-15 ENCOUNTER — Other Ambulatory Visit: Payer: Self-pay | Admitting: Family Medicine

## 2021-12-15 DIAGNOSIS — R42 Dizziness and giddiness: Secondary | ICD-10-CM

## 2021-12-16 ENCOUNTER — Encounter (HOSPITAL_COMMUNITY)
Admission: RE | Admit: 2021-12-16 | Discharge: 2021-12-16 | Disposition: A | Discharge status: Home / Self Care | Payer: Medicare Other | Source: Ambulatory Visit | Attending: Family Medicine | Admitting: Family Medicine

## 2021-12-16 DIAGNOSIS — R42 Dizziness and giddiness: Secondary | ICD-10-CM | POA: Diagnosis not present

## 2021-12-16 DIAGNOSIS — R2689 Other abnormalities of gait and mobility: Secondary | ICD-10-CM | POA: Insufficient documentation

## 2022-01-05 ENCOUNTER — Other Ambulatory Visit: Payer: Self-pay | Admitting: Family Medicine

## 2022-01-05 DIAGNOSIS — E119 Type 2 diabetes mellitus without complications: Secondary | ICD-10-CM

## 2022-01-26 ENCOUNTER — Other Ambulatory Visit: Payer: Self-pay | Admitting: Cardiology

## 2022-02-02 ENCOUNTER — Encounter: Payer: Self-pay | Admitting: Family Medicine

## 2022-02-02 ENCOUNTER — Ambulatory Visit (INDEPENDENT_AMBULATORY_CARE_PROVIDER_SITE_OTHER): Payer: Medicare Other | Admitting: Family Medicine

## 2022-02-02 VITALS — BP 135/82 | HR 87 | Temp 96.9°F | Ht 75.0 in | Wt 270.4 lb

## 2022-02-02 DIAGNOSIS — Z23 Encounter for immunization: Secondary | ICD-10-CM

## 2022-02-02 DIAGNOSIS — R300 Dysuria: Secondary | ICD-10-CM | POA: Diagnosis not present

## 2022-02-02 DIAGNOSIS — N390 Urinary tract infection, site not specified: Secondary | ICD-10-CM | POA: Diagnosis not present

## 2022-02-02 LAB — URINALYSIS, COMPLETE
Bilirubin, UA: NEGATIVE
Ketones, UA: NEGATIVE
Leukocytes,UA: NEGATIVE
Nitrite, UA: NEGATIVE
Specific Gravity, UA: 1.02 (ref 1.005–1.030)
Urobilinogen, Ur: 0.2 mg/dL (ref 0.2–1.0)
pH, UA: 5 (ref 5.0–7.5)

## 2022-02-02 MED ORDER — SULFAMETHOXAZOLE-TRIMETHOPRIM 800-160 MG PO TABS: 1.0000 | ORAL_TABLET | Freq: Two times a day (BID) | ORAL | 0 refills | Status: DC

## 2022-02-02 NOTE — Progress Notes (Signed)
Chief Complaint  Patient presents with   Dysuria    HPI  Patient presents today for burning when he pees. Burns at tip of the penis. Onset 2-3 days ago. Some frequency. Drinks a lot of water  No flank pain, fever, nausea or vomiting. He has autism and his sister says he rarely complains. She gives much of the HPI  PMH: Smoking status noted ROS: Per HPI  Objective: BP 135/82   Pulse 87   Temp (!) 96.9 F (36.1 C)   Ht '6\' 3"'$  (1.905 m)   Wt 270 lb 6.4 oz (122.7 kg)   SpO2 95%   BMI 33.80 kg/m  Gen: NAD, alert, cooperative with exam HEENT: NCAT, EOMI, PERRL CV: RRR, good S1/S2, no murmur Resp: CTABL, no wheezes, non-labored Abd: SNTND, BS present, no guarding or organomegaly Ext: No edema, warm Neuro: Alert and oriented  Assessment and plan:  1. Dysuria   2. Urinary tract infection without hematuria, site unspecified   Concern for prosatitis  Meds ordered this encounter  Medications   sulfamethoxazole-trimethoprim (BACTRIM DS) 800-160 MG tablet    Sig: Take 1 tablet by mouth 2 (two) times daily.    Dispense:  20 tablet    Refill:  0    Orders Placed This Encounter  Procedures   Urine Culture   Urinalysis, Complete    Follow up as needed.  Claretta Fraise, MD

## 2022-02-04 LAB — URINE CULTURE

## 2022-02-07 DIAGNOSIS — I509 Heart failure, unspecified: Secondary | ICD-10-CM | POA: Diagnosis not present

## 2022-02-07 DIAGNOSIS — F840 Autistic disorder: Secondary | ICD-10-CM | POA: Diagnosis not present

## 2022-02-07 DIAGNOSIS — R42 Dizziness and giddiness: Secondary | ICD-10-CM | POA: Diagnosis not present

## 2022-02-27 ENCOUNTER — Encounter: Payer: Self-pay | Admitting: Cardiology

## 2022-02-27 ENCOUNTER — Ambulatory Visit: Payer: Medicare Other | Attending: Cardiology | Admitting: Cardiology

## 2022-02-27 VITALS — BP 114/74 | HR 82 | Ht 75.0 in | Wt 273.0 lb

## 2022-02-27 DIAGNOSIS — R42 Dizziness and giddiness: Secondary | ICD-10-CM | POA: Diagnosis not present

## 2022-02-27 DIAGNOSIS — I5022 Chronic systolic (congestive) heart failure: Secondary | ICD-10-CM | POA: Diagnosis not present

## 2022-02-27 MED ORDER — CARVEDILOL 25 MG PO TABS: 12.5000 mg | ORAL_TABLET | Freq: Two times a day (BID) | ORAL | 3 refills | Status: DC

## 2022-02-27 MED ORDER — CARVEDILOL 12.5 MG PO TABS: 12.5000 mg | ORAL_TABLET | Freq: Two times a day (BID) | ORAL | 3 refills | Status: DC

## 2022-02-27 NOTE — Progress Notes (Signed)
Clinical Summary Mr. Daleo is a 52 y.o.maleseen today for follow up of the following medical problems.      1. Chronic systolic HF/NICM - new diagnosis during 08/2016 admission with fluid overload - 02/2017 cath no significant CAD.     07/2017 echo LVEF 35-40%, grade II diastolic dysfunction     12/3788 echo: LVEF 50-55%, no WMAs       - last visit we lowered entersto to 24/'26mg'$  bid due to dizziness, though unlcear if true cause as he was also being worked up for vertigo - ENT note feels like symptoms are not vertigo - working to stay well hydrated - ongoing dizzy spells        Other medical problems not addressed this visit   2. Nocturnal hypoxia/Possible sleep apnea - followed by Dr Radford Pax - drop in O2 sats noted during home oximetry. There were discussions about a formal sleep study however family does not feel patient could cooperate with study due to his autism     3. Autism   4. CKD     5. Hyperlipidemia -02/2021 TC 129 TG 196 HDL 37 LDL 60        SH: Has 7 brothers and sisters.    Past Medical History:  Diagnosis Date   Allergy    Autism    CHF (congestive heart failure) ([place])    a. EF 20-25% by echo in 08/2016 b. repeat echo in 02/2017 showing EF 30-35% with cath showing normal cors.    Diabetes mellitus without complication (HCC)    Obesity (BMI 30-39.9) 10/23/2017     Allergies  Allergen Reactions   Semaglutide Other (See Comments)    DIZZINESS with Rybelsus     Current Outpatient Medications  Medication Sig Dispense Refill   carvedilol (COREG) 25 MG tablet TAKE ONE TABLET BY MOUTH TWICE DAILY 180 tablet 1   diclofenac Sodium (VOLTAREN) 1 % GEL Voltaren 1 % topical gel     Empagliflozin-Linaglip-Metform (TRIJARDY XR) 25-08-998 MG TB24 Take 1 tablet by mouth daily. STOP RYBELSUS AND SYNJARDY 30 tablet 12   furosemide (LASIX) 20 MG tablet TAKE ONE TABLET BY MOUTH DAILY AS NEEDED 90 tablet 1   glipiZIDE (GLUCOTROL) 5 MG tablet TAKE 1  TABLET BY MOUTH TWICE DAILY BEFORE MEALS 180 tablet 0   loratadine (CLARITIN) 10 MG tablet TAKE ONE (1) TABLET EACH DAY 90 tablet 0   meclizine (ANTIVERT) 12.5 MG tablet TAKE 1-2 TABLETS BY MOUTH 3 TIMES DAILY AS NEEDED FOR DIZZINESS 30 tablet 1   pantoprazole (PROTONIX) 40 MG tablet Take 1 tablet (40 mg total) by mouth 2 (two) times daily before a meal. As directed by Dr Darnell Level 180 tablet 2   potassium chloride SA (KLOR-CON M) 20 MEQ tablet TAKE TWO TABLETS BY MOUTH DAILY 180 tablet 1   pravastatin (PRAVACHOL) 10 MG tablet TAKE ONE (1) TABLET EACH DAY 30 tablet 5   sacubitril-valsartan (ENTRESTO) 24-26 MG Take 1 tablet by mouth 2 (two) times daily. 60 tablet 6   sulfamethoxazole-trimethoprim (BACTRIM DS) 800-160 MG tablet Take 1 tablet by mouth 2 (two) times daily. 20 tablet 0   No current facility-administered medications for this visit.     Past Surgical History:  Procedure Laterality Date   RIGHT/LEFT HEART CATH AND CORONARY ANGIOGRAPHY N/A 03/06/2017   Procedure: RIGHT/LEFT HEART CATH AND CORONARY ANGIOGRAPHY;  Surgeon: Burnell Blanks, MD;  Location: [facility];  Service: Cardiovascular;  Laterality: N/A;  Allergies  Allergen Reactions   Semaglutide Other (See Comments)    DIZZINESS with Rybelsus      Family History  Problem Relation Age of Onset   Diabetes Mother    Diabetes Father    Bladder Cancer Father    Colon cancer Neg Hx    Esophageal cancer Neg Hx    Liver cancer Neg Hx    Stomach cancer Neg Hx      Social History Mr. Gumina reports that he has never smoked. He has never used smokeless tobacco. Mr. Wesely reports no history of alcohol use.   Review of Systems CONSTITUTIONAL: No weight loss, fever, chills, weakness or fatigue.  HEENT: Eyes: No visual loss, blurred vision, double vision or yellow sclerae.No hearing loss, sneezing, congestion, runny nose or sore throat.  SKIN: No rash or itching.  CARDIOVASCULAR: per hpi RESPIRATORY: No  shortness of breath, cough or sputum.  GASTROINTESTINAL: No anorexia, nausea, vomiting or diarrhea. No abdominal pain or blood.  GENITOURINARY: No burning on urination, no polyuria NEUROLOGICAL: No headache, dizziness, syncope, paralysis, ataxia, numbness or tingling in the extremities. No change in bowel or bladder control.  MUSCULOSKELETAL: No muscle, back pain, joint pain or stiffness.  LYMPHATICS: No enlarged nodes. No history of splenectomy.  PSYCHIATRIC: No history of depression or anxiety.  ENDOCRINOLOGIC: No reports of sweating, cold or heat intolerance. No polyuria or polydipsia.  Marland Kitchen   Physical Examination Today's Vitals   02/27/22 1448  BP: 114/74  Pulse: 82  Weight: 273 lb (123.8 kg)  Height: '6\' 3"'$  (1.905 m)   Body mass index is 34.12 kg/m.  Gen: resting comfortably, no acute distress HEENT: no scleral icterus, pupils equal round and reactive, no palptable cervical adenopathy,  CV: RRR, no m/r/g no jvd Resp: Clear to auscultation bilaterally GI: abdomen is soft, non-tender, non-distended, normal bowel sounds, no hepatosplenomegaly MSK: extremities are warm, no edema.  Skin: warm, no rash Neuro:  no focal deficits Psych: appropriate affect   Diagnostic Studies   02/2017 cath Hemodynamic findings consistent with mild pulmonary hypertension.   1. No angiographic evidence of CAD 2. Non-ischemic cardiomyopathy   Recommendations: medical management of cardiomyopathy and CHF.      02/2017 echo Study Conclusions   - Left ventricle: The cavity size was mildly dilated. Wall   thickness was normal. Systolic function was moderately to   severely reduced. The estimated ejection fraction was in the   range of 30% to 35%. Diffuse hypokinesis. Diastolic dysfunction,   grade indeterminate. Indeterminate filling pressures. - Mitral valve: There was mild regurgitation.     07/2017 echo Study Conclusions   - Left ventricle: The cavity size was mildly dilated. Wall    thickness was normal. Systolic function was moderately reduced.   The estimated ejection fraction was in the range of 35% to 40%.   Diffuse hypokinesis. Features are consistent with a pseudonormal   left ventricular filling pattern, with concomitant abnormal   relaxation and increased filling pressure (grade 2 diastolic   dysfunction). - Mitral valve: There was mild regurgitation.     09/2019 echo 1. Left ventricular ejection fraction, by estimation, is 50 to 55%. The  left ventricle has normal function. The left ventricle has no regional  wall motion abnormalities. Left ventricular diastolic parameters were  normal.   2. Right ventricular systolic function is normal. The right ventricular  size is normal. There is normal pulmonary artery systolic pressure.   3. The mitral valve is normal in structure. No evidence  of mitral valve  regurgitation. No evidence of mitral stenosis.   4. The aortic valve was not well visualized. Aortic valve regurgitation  is not visualized. No aortic stenosis is present.   5. The inferior vena cava is normal in size with greater than 50%  respiratory variability, suggesting right atrial pressure of 3 mmHg.  Assessment and Plan   1. Chronic systolic HF/ NICM - medical therapy limited by his renal function and soft bp's - by most recent echo LVEF has noramlzied - low bp's at last visit and ongoign dizziness, we lowered entresot to 24/'26mg'$  bid. Ongoing dizziness unclear if medication related, we will lower coreg to 12.'5mg'$  bid and family will keep [place] updated. Seen by ENT and thought not to be vertigo - euvolemic today, no HF symptoms       Arnoldo Lenis, M.D.

## 2022-02-27 NOTE — Addendum Note (Signed)
Addended by: Christella Scheuermann C on: 02/27/2022 03:30 PM   Modules accepted: Orders

## 2022-02-27 NOTE — Telephone Encounter (Signed)
Error

## 2022-02-27 NOTE — Patient Instructions (Addendum)
Medication Instructions:  Your physician has recommended you make the following change in your medication:  Decrease Coreg to 12.5 mg tablets twice daily   Labwork: None  Testing/Procedures: None  Follow-Up: Follow up with Dr. Harl Bowie in 3  months.   Any Other Special Instructions Will Be Listed Below (If Applicable).  Call our office in 2 weeks and update [place] on your dizziness.    If you need a refill on your cardiac medications before your next appointment, please call your pharmacy.

## 2022-02-28 ENCOUNTER — Ambulatory Visit (INDEPENDENT_AMBULATORY_CARE_PROVIDER_SITE_OTHER): Payer: Medicare Other

## 2022-02-28 VITALS — Ht 75.0 in | Wt 276.0 lb

## 2022-02-28 DIAGNOSIS — Z0000 Encounter for general adult medical examination without abnormal findings: Secondary | ICD-10-CM | POA: Diagnosis not present

## 2022-02-28 NOTE — Patient Instructions (Signed)
Michael Ramos , Thank you for taking time to come for your Medicare Wellness Visit. I appreciate your ongoing commitment to your health goals. Please review the following plan we discussed and let me know if I can assist you in the future.   These are the goals we discussed:  Goals       Exercise 3x per week (30 min per time)      Try to exercise for at least 30 minutes, 3 times weekly       T2DM PHARMD GOAL (pt-stated)      Current Barriers:  Unable to achieve control of T2DM  Suboptimal therapeutic regimen for T2DM  Pharmacist Clinical Goal(s):  patient will achieve & maintain control of T2DM as evidenced by IMPROVED GLYCEMIC CONTROL through collaboration with PharmD and provider.   Interventions: 1:1 collaboration with Janora Norlander, DO regarding development and update of comprehensive plan of care as evidenced by provider attestation and co-signature Inter-disciplinary care team collaboration (see longitudinal plan of care) Comprehensive medication review performed; medication list updated in electronic medical record  Diabetes: Goal on Track (progressing): YES. Controlled-A1C 6.8-->6.4%; current treatment: trijardy XR; glipizide  Patient couldn't tolerated GLP1 due to dizziness-->Rybelsus stopped Continue glipizide Continue Trijardy XR Tolerating well Blood sugars currently at goal--discussed with mom, Michael Ramos Still having some dizziness, no hypoglycemia, taking meclizine-->encouraged PCP follow up if unresolved Current glucose readings: fasting glucose: <165, post prandial glucose: <200 Denies hypoglycemic/hyperglycemic symptoms Discussed meal planning options and Plate method for healthy eating Avoid sugary drinks and desserts Incorporate balanced protein, non starchy veggies, 1 serving of carbohydrate with each meal Increase water intake Increase physical activity as able Current exercise: n/a LDL at goal <70 (60)--continue current therapy Recommended continue  trijardy, glipizide Collaborated with PCP   Patient Goals/Self-Care Activities patient will:  - take medications as prescribed as evidenced by patient report and record review check glucose DAILY, document, and provide at future appointments engage in dietary modifications by following HEART HEALTHY diet--WILL ALSO BE BENEFICIAL FOR CHOLESTEROL         This is a list of the screening recommended for you and due dates:  Health Maintenance  Topic Date Due   Yearly kidney health urinalysis for diabetes  10/30/2020   COVID-19 Vaccine (4 - 2023-24 season) 12/09/2021   Zoster (Shingles) Vaccine (1 of 2) Never done   Hemoglobin A1C  05/25/2022   Complete foot exam   05/31/2022   Eye exam for diabetics  11/17/2022   Yearly kidney function blood test for diabetes  11/23/2022   Medicare Annual Wellness Visit  03/01/2023   Colon Cancer Screening  11/19/2028   Flu Shot  Completed   Hepatitis C Screening: USPSTF Recommendation to screen - Ages 18-79 yo.  Completed   HIV Screening  Completed   HPV Vaccine  Aged Out    Advanced directives: Advance directive discussed with you today. I have provided a copy for you to complete at home and have notarized. Once this is complete please bring a copy in to our office so we can scan it into your chart.   Conditions/risks identified: Aim for 30 minutes of exercise or brisk walking, 6-8 glasses of water, and 5 servings of fruits and vegetables each day.   Next appointment: Follow up in one year for your annual wellness visit   Preventive Care 40-64 Years, Male Preventive care refers to lifestyle choices and visits with your health care provider that can promote health and wellness. What does preventive care  include? A yearly physical exam. This is also called an annual well check. Dental exams once or twice a year. Routine eye exams. Ask your health care provider how often you should have your eyes checked. Personal lifestyle choices,  including: Daily care of your teeth and gums. Regular physical activity. Eating a healthy diet. Avoiding tobacco and drug use. Limiting alcohol use. Practicing safe sex. Taking low-dose aspirin every day starting at age 40. What happens during an annual well check? The services and screenings done by your health care provider during your annual well check will depend on your age, overall health, lifestyle risk factors, and family history of disease. Counseling  Your health care provider may ask you questions about your: Alcohol use. Tobacco use. Drug use. Emotional well-being. Home and relationship well-being. Sexual activity. Eating habits. Work and work Statistician. Screening  You may have the following tests or measurements: Height, weight, and BMI. Blood pressure. Lipid and cholesterol levels. These may be checked every 5 years, or more frequently if you are over 54 years old. Skin check. Lung cancer screening. You may have this screening every year starting at age 54 if you have a 30-pack-year history of smoking and currently smoke or have quit within the past 15 years. Fecal occult blood test (FOBT) of the stool. You may have this test every year starting at age 43. Flexible sigmoidoscopy or colonoscopy. You may have a sigmoidoscopy every 5 years or a colonoscopy every 10 years starting at age 69. Prostate cancer screening. Recommendations will vary depending on your family history and other risks. Hepatitis C blood test. Hepatitis B blood test. Sexually transmitted disease (STD) testing. Diabetes screening. This is done by checking your blood sugar (glucose) after you have not eaten for a while (fasting). You may have this done every 1-3 years. Discuss your test results, treatment options, and if necessary, the need for more tests with your health care provider. Vaccines  Your health care provider may recommend certain vaccines, such as: Influenza vaccine. This is  recommended every year. Tetanus, diphtheria, and acellular pertussis (Tdap, Td) vaccine. You may need a Td booster every 10 years. Zoster vaccine. You may need this after age 51. Pneumococcal 13-valent conjugate (PCV13) vaccine. You may need this if you have certain conditions and have not been vaccinated. Pneumococcal polysaccharide (PPSV23) vaccine. You may need one or two doses if you smoke cigarettes or if you have certain conditions. Talk to your health care provider about which screenings and vaccines you need and how often you need them. This information is not intended to replace advice given to you by your health care provider. Make sure you discuss any questions you have with your health care provider. Document Released: 04/23/2015 Document Revised: 12/15/2015 Document Reviewed: 01/26/2015 Elsevier Interactive Patient Education  2017 [place] Prevention in the Home Falls can cause injuries. They can happen to people of all ages. There are many things you can do to make your home safe and to help prevent falls. What can I do on the outside of my home? Regularly fix the edges of walkways and driveways and fix any cracks. Remove anything that might make you trip as you walk through a door, such as a raised step or threshold. Trim any bushes or trees on the path to your home. Use bright outdoor lighting. Clear any walking paths of anything that might make someone trip, such as rocks or tools. Regularly check to see if handrails are loose or broken.  Make sure that both sides of any steps have handrails. Any raised decks and porches should have guardrails on the edges. Have any leaves, snow, or ice cleared regularly. Use sand or salt on walking paths during winter. Clean up any spills in your garage right away. This includes oil or grease spills. What can I do in the bathroom? Use night lights. Install grab bars by the toilet and in the tub and shower. Do not use towel bars  as grab bars. Use non-skid mats or decals in the tub or shower. If you need to sit down in the shower, use a plastic, non-slip stool. Keep the floor dry. Clean up any water that spills on the floor as soon as it happens. Remove soap buildup in the tub or shower regularly. Attach bath mats securely with double-sided non-slip rug tape. Do not have throw rugs and other things on the floor that can make you trip. What can I do in the bedroom? Use night lights. Make sure that you have a light by your bed that is easy to reach. Do not use any sheets or blankets that are too big for your bed. They should not hang down onto the floor. Have a firm chair that has side arms. You can use this for support while you get dressed. Do not have throw rugs and other things on the floor that can make you trip. What can I do in the kitchen? Clean up any spills right away. Avoid walking on wet floors. Keep items that you use a lot in easy-to-reach places. If you need to reach something above you, use a strong step stool that has a grab bar. Keep electrical cords out of the way. Do not use floor polish or wax that makes floors slippery. If you must use wax, use non-skid floor wax. Do not have throw rugs and other things on the floor that can make you trip. What can I do with my stairs? Do not leave any items on the stairs. Make sure that there are handrails on both sides of the stairs and use them. Fix handrails that are broken or loose. Make sure that handrails are as long as the stairways. Check any carpeting to make sure that it is firmly attached to the stairs. Fix any carpet that is loose or worn. Avoid having throw rugs at the top or bottom of the stairs. If you do have throw rugs, attach them to the floor with carpet tape. Make sure that you have a light switch at the top of the stairs and the bottom of the stairs. If you do not have them, ask someone to add them for you. What else can I do to help  prevent falls? Wear shoes that: Do not have high heels. Have rubber bottoms. Are comfortable and fit you well. Are closed at the toe. Do not wear sandals. If you use a stepladder: Make sure that it is fully opened. Do not climb a closed stepladder. Make sure that both sides of the stepladder are locked into place. Ask someone to hold it for you, if possible. Clearly mark and make sure that you can see: Any grab bars or handrails. First and last steps. Where the edge of each step is. Use tools that help you move around (mobility aids) if they are needed. These include: Canes. Walkers. Scooters. Crutches. Turn on the lights when you go into a dark area. Replace any light bulbs as soon as they burn out. Set up  your furniture so you have a clear path. Avoid moving your furniture around. If any of your floors are uneven, fix them. If there are any pets around you, be aware of where they are. Review your medicines with your doctor. Some medicines can make you feel dizzy. This can increase your chance of falling. Ask your doctor what other things that you can do to help prevent falls. This information is not intended to replace advice given to you by your health care provider. Make sure you discuss any questions you have with your health care provider. Document Released: 01/21/2009 Document Revised: 09/02/2015 Document Reviewed: 05/01/2014 Elsevier Interactive Patient Education  2017 [employer].

## 2022-02-28 NOTE — Progress Notes (Signed)
Subjective:   Michael Ramos is a 51 y.o. male who presents for Medicare Annual/Subsequent preventive examination. I connected with  [name] on 02/28/22 by a audio enabled telemedicine application and verified that I am speaking with the correct person using two identifiers.  Patient Location: Home  Provider Location: Home Office  I discussed the limitations of evaluation and management by telemedicine. The patient expressed understanding and agreed to proceed.  Review of Systems     Cardiac Risk Factors include: advanced age (>7mn, >>19women);diabetes mellitus;dyslipidemia;hypertension;male gender     Objective:    Today's Vitals   02/28/22 1140  Weight: 276 lb (125.2 kg)  Height: '6\' 3"'$  (1.905 m)   Body mass index is 34.5 kg/m.     02/28/2022   11:44 AM 02/04/2021   11:14 AM 10/25/2018   10:43 AM 12/31/2017    1:10 PM 03/06/2017   10:49 AM 08/15/2016    6:27 PM 08/15/2016    6:21 PM  Advanced Directives  Does Patient Have a Medical Advance Directive? Yes No Yes No No Yes Yes  Type of AParamedicof AGolfLiving will  Healthcare Power of [place] Does patient want to make changes to medical advance directive?   No - Patient declined   No - Patient declined   Copy of [place] Chart? No - copy requested  No - copy requested   No - copy requested No - copy requested  Would patient like information on creating a medical advance directive?  No - Patient declined  No - Patient declined No - Patient declined No - Patient declined No - Patient declined    Current Medications (verified) Outpatient Encounter Medications as of 02/28/2022  Medication Sig   carvedilol (COREG) 12.5 MG tablet Take 1 tablet (12.5 mg total) by mouth 2 (two) times daily.   diclofenac Sodium (VOLTAREN) 1 % GEL Voltaren 1 % topical gel   Empagliflozin-Linaglip-Metform (TRIJARDY XR)  25-08-998 MG TB24 Take 1 tablet by mouth daily. STOP RYBELSUS AND SYNJARDY   furosemide (LASIX) 20 MG tablet TAKE ONE TABLET BY MOUTH DAILY AS NEEDED   glipiZIDE (GLUCOTROL) 5 MG tablet TAKE 1 TABLET BY MOUTH TWICE DAILY BEFORE MEALS   loratadine (CLARITIN) 10 MG tablet TAKE ONE (1) TABLET EACH DAY   meclizine (ANTIVERT) 12.5 MG tablet TAKE 1-2 TABLETS BY MOUTH 3 TIMES DAILY AS NEEDED FOR DIZZINESS   pantoprazole (PROTONIX) 40 MG tablet Take 1 tablet (40 mg total) by mouth 2 (two) times daily before a meal. As directed by Dr GDarnell Level  potassium chloride SA (KLOR-CON M) 20 MEQ tablet TAKE TWO TABLETS BY MOUTH DAILY   pravastatin (PRAVACHOL) 10 MG tablet TAKE ONE (1) TABLET EACH DAY   sacubitril-valsartan (ENTRESTO) 24-26 MG Take 1 tablet by mouth 2 (two) times daily.   sulfamethoxazole-trimethoprim (BACTRIM DS) 800-160 MG tablet Take 1 tablet by mouth 2 (two) times daily.   No facility-administered encounter medications on file as of 02/28/2022.    Allergies (verified) Semaglutide   History: Past Medical History:  Diagnosis Date   Allergy    Autism    CHF (congestive heart [place]) [place]    a. EF 20-25% by echo in 08/2016 b. repeat echo in 02/2017 showing EF 30-35% with cath showing normal cors.    Diabetes mellitus without complication (HCC)    Obesity (BMI 30-39.9) 10/23/2017   Past Surgical History:  Procedure Laterality Date   RIGHT/LEFT HEART CATH AND CORONARY ANGIOGRAPHY N/A 03/06/2017   Procedure: RIGHT/LEFT HEART CATH AND CORONARY ANGIOGRAPHY;  Surgeon: Burnell Blanks, MD;  Location: [facility];  Service: Cardiovascular;  Laterality: N/A;   Family History  Problem Relation Age of Onset   Diabetes Mother    Diabetes Father    Bladder Cancer Father    Colon cancer Neg Hx    Esophageal cancer Neg Hx    Liver cancer Neg Hx    Stomach cancer Neg Hx    Social History   Socioeconomic History   Marital status: Single    Spouse name: Not on file   Number of  children: Not on file   Years of education: Not on file   Highest education level: 12th grade  Occupational History   Occupation: Disabled  Tobacco Use   Smoking status: Never   Smokeless tobacco: Never  Vaping Use   Vaping Use: Never used  Substance and Sexual Activity   Alcohol use: No   Drug use: No   Sexual activity: Not Currently  Other Topics Concern   Not on file  Social History Narrative   Autistic   Lives with Mother   Social Determinants of Health   Financial Resource Strain: Low Risk  (02/28/2022)   Overall Financial Resource Strain (CARDIA)    Difficulty of Paying Living Expenses: Not hard at all  Food Insecurity: No Food Insecurity (02/28/2022)   Hunger Vital Sign    Worried About Running Out of Food in the Last Year: Never true    Ran Out of Food in the Last Year: Never true  Transportation Needs: No Transportation Needs (02/28/2022)   PRAPARE - Hydrologist (Medical): No    Lack of Transportation (Non-Medical): No  Physical Activity: Insufficiently Active (02/28/2022)   Exercise Vital Sign    Days of Exercise per Week: 3 days    Minutes of Exercise per Session: 30 min  Stress: No Stress Concern Present (02/28/2022)   [place]    Feeling of Stress : Not at all  Social Connections: Moderately Isolated (02/28/2022)   Social Connection and Isolation Panel [NHANES]    Frequency of Communication with Friends and Family: More than three times a week    Frequency of Social Gatherings with Friends and Family: More than three times a week    Attends Religious Services: 1 to 4 times per year    Active Member of [employer] or Organizations: No    Attends Music therapist: Never    Marital Status: Never married    Tobacco Counseling Counseling given: Not Answered   Clinical Intake:  Pre-visit preparation completed: Yes  Pain : No/denies pain      Nutritional Risks: None Diabetes: Yes  How often do you need to have someone help you when you read instructions, pamphlets, or other written materials from your doctor or pharmacy?: 1 - Never  Diabetic?yes  Nutrition Risk Assessment:  Has the patient had any N/V/D within the last 2 months?  No  Does the patient have any non-healing wounds?  No  Has the patient had any unintentional weight loss or weight gain?  No   Diabetes:  Is the patient diabetic?  Yes  If diabetic, was a CBG obtained today?  No  Did the patient bring in their glucometer from home?  No  How often do you monitor your  CBG's? 3 x day .   Financial Strains and Diabetes Management:  Are you having any financial strains with the device, your supplies or your medication? No .  Does the patient want to be seen by Chronic Care Management for management of their diabetes?  No  Would the patient like to be referred to a Nutritionist or for Diabetic Management?  No   Diabetic Exams:  Diabetic Eye Exam: Completed 11/2021 Diabetic Foot Exam: Overdue, Pt has been advised about the importance in completing this exam. Pt is scheduled for diabetic foot exam on next office visit .   Interpreter Needed?: No  Information entered by :: Jadene Pierini, LPN   Activities of Daily Living    02/28/2022   11:44 AM  In your present state of health, do you have any difficulty performing the following activities:  Hearing? 0  Vision? 0  Difficulty concentrating or making decisions? 0  Walking or climbing stairs? 0  Dressing or bathing? 0  Doing errands, shopping? 0  Preparing Food and eating ? N  Using the Toilet? N  In the past six months, have you accidently leaked urine? N  Do you have problems with loss of bowel control? N  Managing your Medications? N  Managing your Finances? N  Housekeeping or managing your Housekeeping? N    Patient Care Team: Janora Norlander, DO as PCP - General (Family Medicine) Harl Bowie  Alphonse Guild, MD as PCP - Cardiology (Cardiology) Lavera Guise, [facility] as Pharmacist (Family Medicine)  Indicate any recent Medical Services you may have received from other than [facility] providers in the past year (date may be approximate).     Assessment:   This is a routine wellness examination for [place].  Hearing/Vision screen Vision Screening - Comments:: Wears rx glasses - up to date with routine eye exams with    Dietary issues and exercise activities discussed: Current Exercise Habits: The patient does not participate in regular exercise at present, Exercise limited by: None identified   Goals Addressed             This Visit's Progress    Exercise 3x per week (30 min per time)   On track    Try to exercise for at least 30 minutes, 3 times weekly        Depression Screen    02/28/2022   11:42 AM 02/02/2022   10:54 AM 11/22/2021   11:22 AM 08/29/2021    1:25 PM 05/31/2021   10:47 AM 04/18/2021    1:27 PM 02/23/2021   10:25 AM  PHQ 2/9 Scores  PHQ - 2 Score 0 0 0 0 0 0 0  PHQ- 9 Score    0       Fall Risk    02/28/2022   11:41 AM 02/02/2022   10:54 AM 11/22/2021   11:22 AM 08/29/2021    1:25 PM 05/31/2021   10:47 AM  Fall Risk   Falls in the past year? 0 0 0 0 0  Number falls in past yr: 0      Injury with Fall? 0      Risk for fall due to : No Fall Risks      Follow up Falls prevention discussed        [place]:  Any stairs in or around the home? No  If so, are there any without handrails? No  Home free of loose throw rugs in walkways, pet beds,  electrical cords, etc? Yes  Adequate lighting in your home to reduce risk of falls? Yes   ASSISTIVE DEVICES UTILIZED TO PREVENT FALLS:  Life alert? No  Use of a cane, walker or w/c? No  Grab bars in the bathroom? No  Shower chair or bench in shower? No  Elevated toilet seat or a handicapped toilet? No          02/28/2022   11:44 AM  6CIT Screen  What Year? 0 points   What month? 0 points  What time? 0 points  Count back from 20 0 points  Months in reverse 0 points  Repeat phrase 0 points  Total Score 0 points    Immunizations Immunization History  Administered Date(s) Administered   Influenza,inj,Quad PF,6+ Mos 01/18/2016, 01/10/2017, 01/31/2018, 02/09/2020, 02/02/2022   Influenza-Unspecified 01/23/2021   PFIZER(Purple Top)SARS-COV-2 Vaccination 07/17/2019, 08/08/2019, 02/29/2020   Pneumococcal Polysaccharide-23 01/10/2017   Tdap 02/09/2020    TDAP status: Up to date  Flu Vaccine status: Up to date  Pneumococcal vaccine status: Up to date  Covid-19 vaccine status: Completed vaccines  Qualifies for Shingles Vaccine? Yes   Zostavax completed No   Shingrix Completed?: No.    Education has been provided regarding the importance of this vaccine. Patient has been advised to call insurance company to determine out of pocket expense if they have not yet received this vaccine. Advised may also receive vaccine at local pharmacy or Health Dept. Verbalized acceptance and understanding.  Screening Tests Health Maintenance  Topic Date Due   Diabetic kidney evaluation - Urine ACR  10/30/2020   COVID-19 Vaccine (4 - 2023-24 season) 12/09/2021   Zoster Vaccines- Shingrix (1 of 2) Never done   HEMOGLOBIN A1C  05/25/2022   FOOT EXAM  05/31/2022   OPHTHALMOLOGY EXAM  11/17/2022   Diabetic kidney evaluation - GFR measurement  11/23/2022   Medicare Annual Wellness (AWV)  03/01/2023   COLONOSCOPY (Pts 45-81yr Insurance coverage will need to be confirmed)  11/19/2028   INFLUENZA VACCINE  Completed   Hepatitis C Screening  Completed   HIV Screening  Completed   HPV VACCINES  Aged Out    Health Maintenance  Health Maintenance Due  Topic Date Due   Diabetic kidney evaluation - Urine ACR  10/30/2020   COVID-19 Vaccine (4 - 2023-24 season) 12/09/2021   Zoster Vaccines- Shingrix (1 of 2) Never done    Colorectal cancer screening: Type of screening:  Colonoscopy. Completed 0/03/2019. Repeat every 10 years  Lung Cancer Screening: (Low Dose CT Chest recommended if Age 50-80years, 30 pack-year currently smoking OR have quit w/in 15years.) does not qualify.   Lung Cancer Screening Referral: n/a  Additional Screening:  Hepatitis C Screening: does not qualify;  Vision Screening: Recommended annual ophthalmology exams for early detection of glaucoma and other disorders of the eye. Is the patient up to date with their annual eye exam?  Yes  Who is the provider or what is the name of the office in which the patient attends annual eye exams? Dr.groat  If pt is not established with a provider, would they like to be referred to a provider to establish care? No .   Dental Screening: Recommended annual dental exams for proper oral hygiene  Community Resource Referral / Chronic Care Management: CRR required this visit?  No   CCM required this visit?  No      Plan:     I have personally reviewed and noted the following in the patient's chart:  Medical and social history Use of alcohol, tobacco or illicit drugs  Current medications and supplements including opioid prescriptions. Patient is not currently taking opioid prescriptions. Functional ability and status Nutritional status Physical activity Advanced directives List of other physicians Hospitalizations, surgeries, and ER visits in previous 12 months Vitals Screenings to include cognitive, depression, and falls Referrals and appointments  In addition, I have reviewed and discussed with patient certain preventive protocols, quality metrics, and best practice recommendations. A written personalized care plan for preventive services as well as general preventive health recommendations were provided to patient.     Daphane Shepherd, LPN   [id number]   Nurse Notes: none

## 2022-03-10 ENCOUNTER — Other Ambulatory Visit: Payer: Self-pay | Admitting: Family Medicine

## 2022-03-10 DIAGNOSIS — J3089 Other allergic rhinitis: Secondary | ICD-10-CM

## 2022-03-27 ENCOUNTER — Other Ambulatory Visit: Payer: Self-pay | Admitting: Cardiology

## 2022-03-27 ENCOUNTER — Other Ambulatory Visit: Payer: Self-pay | Admitting: Family Medicine

## 2022-03-27 DIAGNOSIS — K219 Gastro-esophageal reflux disease without esophagitis: Secondary | ICD-10-CM

## 2022-04-05 ENCOUNTER — Ambulatory Visit (INDEPENDENT_AMBULATORY_CARE_PROVIDER_SITE_OTHER): Payer: Medicare Other | Admitting: Family Medicine

## 2022-04-05 ENCOUNTER — Encounter: Payer: Self-pay | Admitting: Family Medicine

## 2022-04-05 VITALS — BP 123/84 | HR 83 | Temp 97.3°F | Ht 75.0 in | Wt 276.4 lb

## 2022-04-05 DIAGNOSIS — E1159 Type 2 diabetes mellitus with other circulatory complications: Secondary | ICD-10-CM

## 2022-04-05 DIAGNOSIS — I428 Other cardiomyopathies: Secondary | ICD-10-CM | POA: Diagnosis not present

## 2022-04-05 DIAGNOSIS — K219 Gastro-esophageal reflux disease without esophagitis: Secondary | ICD-10-CM

## 2022-04-05 DIAGNOSIS — N1831 Chronic kidney disease, stage 3a: Secondary | ICD-10-CM | POA: Diagnosis not present

## 2022-04-05 DIAGNOSIS — E559 Vitamin D deficiency, unspecified: Secondary | ICD-10-CM | POA: Diagnosis not present

## 2022-04-05 DIAGNOSIS — E1122 Type 2 diabetes mellitus with diabetic chronic kidney disease: Secondary | ICD-10-CM

## 2022-04-05 DIAGNOSIS — I152 Hypertension secondary to endocrine disorders: Secondary | ICD-10-CM

## 2022-04-05 DIAGNOSIS — E785 Hyperlipidemia, unspecified: Secondary | ICD-10-CM | POA: Diagnosis not present

## 2022-04-05 DIAGNOSIS — E1169 Type 2 diabetes mellitus with other specified complication: Secondary | ICD-10-CM | POA: Diagnosis not present

## 2022-04-05 LAB — BAYER DCA HB A1C WAIVED: HB A1C (BAYER DCA - WAIVED): 7.2 % — ABNORMAL HIGH (ref 4.8–5.6)

## 2022-04-05 MED ORDER — PANTOPRAZOLE SODIUM 40 MG PO TBEC: 40.0000 mg | DELAYED_RELEASE_TABLET | Freq: Two times a day (BID) | ORAL | 3 refills | Status: DC

## 2022-04-05 MED ORDER — GLIPIZIDE 5 MG PO TABS: 5.0000 mg | ORAL_TABLET | Freq: Two times a day (BID) | ORAL | 3 refills | Status: DC

## 2022-04-05 MED ORDER — TRIJARDY XR 25-5-1000 MG PO TB24: 1.0000 | ORAL_TABLET | Freq: Every day | ORAL | 12 refills | Status: DC

## 2022-04-05 MED ORDER — PRAVASTATIN SODIUM 10 MG PO TABS: ORAL_TABLET | ORAL | 3 refills | Status: DC

## 2022-04-05 NOTE — Progress Notes (Signed)
Subjective: CC:DM PCP: Janora Norlander, DO JXB:[id number] L Lavine is a 51 y.o. male presenting to clinic today for:  1. Type 2 Diabetes with hypertension, hyperlipidemia and CKD3a:  Patient is brought to the office today by his mother.  She feels that his blood sugars have been relatively well-controlled.  He has continued to overindulge in sodas and other sugary foods during the holidays.  He is compliant with all medications.  Does not report any increased urination or thirst.  Last eye exam: Up-to-date Last foot exam: Up-to-date Last A1c:  Lab Results  Component Value Date   HGBA1C 6.2 (H) 11/22/2021   Nephropathy screen indicated?: UTD Last flu, zoster and/or pneumovax:  Immunization History  Administered Date(s) Administered   Influenza,inj,Quad PF,6+ Mos 01/18/2016, 01/10/2017, 01/31/2018, 02/09/2020, 02/02/2022   Influenza-Unspecified 01/23/2021   PFIZER(Purple Top)SARS-COV-2 Vaccination 07/17/2019, 08/08/2019, 02/29/2020   Pneumococcal Polysaccharide-23 01/10/2017   Tdap 02/09/2020    ROS: No chest pain, shortness of breath or edema reported  ROS: Per HPI  Allergies  Allergen Reactions   Semaglutide Other (See Comments)    DIZZINESS with Rybelsus   Past Medical History:  Diagnosis Date   Allergy    Autism    CHF (congestive heart failure) ([place])    a. EF 20-25% by echo in 08/2016 b. repeat echo in 02/2017 showing EF 30-35% with cath showing normal cors.    Diabetes mellitus without complication (HCC)    Obesity (BMI 30-39.9) 10/23/2017    Current Outpatient Medications:    carvedilol (COREG) 12.5 MG tablet, Take 1 tablet (12.5 mg total) by mouth 2 (two) times daily., Disp: 180 tablet, Rfl: 3   diclofenac Sodium (VOLTAREN) 1 % GEL, Voltaren 1 % topical gel, Disp: , Rfl:    Empagliflozin-Linaglip-Metform (TRIJARDY XR) 25-08-998 MG TB24, Take 1 tablet by mouth daily. STOP RYBELSUS AND SYNJARDY, Disp: 30 tablet, Rfl: 12   furosemide (LASIX) 20 MG tablet, TAKE  ONE TABLET BY MOUTH DAILY AS NEEDED, Disp: 90 tablet, Rfl: 1   glipiZIDE (GLUCOTROL) 5 MG tablet, TAKE 1 TABLET BY MOUTH TWICE DAILY BEFORE MEALS, Disp: 180 tablet, Rfl: 0   loratadine (CLARITIN) 10 MG tablet, TAKE ONE (1) TABLET EACH DAY, Disp: 90 tablet, Rfl: 1   meclizine (ANTIVERT) 12.5 MG tablet, TAKE 1-2 TABLETS BY MOUTH 3 TIMES DAILY AS NEEDED FOR DIZZINESS, Disp: 30 tablet, Rfl: 1   pantoprazole (PROTONIX) 40 MG tablet, TAKE 1 TABLET BY MOUTH TWICE DAILY BEFORE MEALS, Disp: 180 tablet, Rfl: 0   potassium chloride SA (KLOR-CON M) 20 MEQ tablet, TAKE TWO TABLETS BY MOUTH DAILY, Disp: 180 tablet, Rfl: 1   pravastatin (PRAVACHOL) 10 MG tablet, TAKE ONE (1) TABLET EACH DAY, Disp: 30 tablet, Rfl: 5   sacubitril-valsartan (ENTRESTO) 24-26 MG, Take 1 tablet by mouth 2 (two) times daily., Disp: 60 tablet, Rfl: 6   sulfamethoxazole-trimethoprim (BACTRIM DS) 800-160 MG tablet, Take 1 tablet by mouth 2 (two) times daily., Disp: 20 tablet, Rfl: 0 Social History   Socioeconomic History   Marital status: Single    Spouse name: Not on file   Number of children: Not on file   Years of education: Not on file   Highest education level: 12th grade  Occupational History   Occupation: Disabled  Tobacco Use   Smoking status: Never   Smokeless tobacco: Never  Vaping Use   Vaping Use: Never used  Substance and Sexual Activity   Alcohol use: No   Drug use: No   Sexual activity: Not  Currently  Other Topics Concern   Not on file  Social History Narrative   Autistic   Lives with Mother   Social Determinants of Health   Financial Resource Strain: Low Risk  (02/28/2022)   Overall Financial Resource Strain (CARDIA)    Difficulty of Paying Living Expenses: Not hard at all  Food Insecurity: No Food Insecurity (02/28/2022)   Hunger Vital Sign    Worried About Running Out of Food in the Last Year: Never true    Ran Out of Food in the Last Year: Never true  Transportation Needs: No Transportation Needs  (02/28/2022)   PRAPARE - Hydrologist (Medical): No    Lack of Transportation (Non-Medical): No  Physical Activity: Insufficiently Active (02/28/2022)   Exercise Vital Sign    Days of Exercise per Week: 3 days    Minutes of Exercise per Session: 30 min  Stress: No Stress Concern Present (02/28/2022)   [place]    Feeling of Stress : Not at all  Social Connections: Moderately Isolated (02/28/2022)   Social Connection and Isolation Panel [NHANES]    Frequency of Communication with Friends and Family: More than three times a week    Frequency of Social Gatherings with Friends and Family: More than three times a week    Attends Religious Services: 1 to 4 times per year    Active Member of [employer] or Organizations: No    Attends Archivist Meetings: Never    Marital Status: Never married  Intimate Partner Violence: Not At Risk (02/28/2022)   Humiliation, Afraid, Rape, and Kick questionnaire    Fear of Current or Ex-Partner: No    Emotionally Abused: No    Physically Abused: No    Sexually Abused: No   Family History  Problem Relation Age of Onset   Diabetes Mother    Diabetes Father    Bladder Cancer Father    Colon cancer Neg Hx    Esophageal cancer Neg Hx    Liver cancer Neg Hx    Stomach cancer Neg Hx     Objective: Office vital signs reviewed. BP 123/84   Pulse 83   Temp (!) 97.3 F (36.3 C)   Ht 6' 3" (1.905 m)   Wt 276 lb 6.4 oz (125.4 kg)   SpO2 95%   BMI 34.55 kg/m   Physical Examination:  General: Awake, alert, well-appearing male in no acute distress, No acute distress HEENT: sclera white, MMM Cardio: regular rate and rhythm, S1S2 heard, no murmurs appreciated Pulm: clear to auscultation bilaterally, no wheezes, rhonchi or rales; normal work of breathing on room air GI: Obese. Extremities: warm, well perfused, trace ankle edema, no cyanosis or clubbing;  +2 pulses bilaterally    Assessment/ Plan: 51 y.o. male   Type 2 diabetes mellitus with stage 3a chronic kidney disease, without long-term current use of insulin ([place]) - Plan: Microalbumin / creatinine urine ratio, Bayer DCA Hb A1c Waived, CMP14+EGFR, CBC with Differential/Platelet, VITAMIN D 25 Hydroxy (Vit-D Deficiency, Fractures), Empagliflozin-Linaglip-Metform (TRIJARDY XR) 25-08-998 MG TB24, glipiZIDE (GLUCOTROL) 5 MG tablet  Hyperlipidemia associated with type 2 diabetes mellitus ([place]) - Plan: CMP14+EGFR, Lipid Panel  Hypertension associated with type 2 diabetes mellitus ([place]) - Plan: CMP14+EGFR  Non-ischemic cardiomyopathy ([place]) - Plan: CMP14+EGFR, CBC with Differential/Platelet, Lipid Panel  Vitamin D deficiency - Plan: VITAMIN D 25 Hydroxy (Vit-D Deficiency, Fractures)  Gastroesophageal reflux disease without esophagitis - Plan: pantoprazole (PROTONIX)  40 MG tablet  Sugar now uncontrolled with A1c rising to 7.2.  I did not make any medication changes today as I do think this is purely related to diet so I highly recommended that cut back on sugar and certainly cut out soda totally.  We discussed the impact of the salt and soda on his cardiomyopathy/HF.  All meds were renewed today.  Urine microalbumin collected.  Lipid panel was fasting and that was performed today  Blood pressure controlled  Check vitamin D level  PPI renewed  No orders of the defined types were placed in this encounter.  No orders of the defined types were placed in this encounter.    Janora Norlander, DO [place] [phone]

## 2022-04-05 NOTE — Patient Instructions (Signed)
Sugar has gone up quite a bit since last visit.  I think this is probably due to what you have eaten during the holidays.  Make sure you cut back on sugary foods and drinks.  If sugar remains uncontrolled by next visit, we can increase your glipizide but hopefully we will not have to do this.

## 2022-04-06 LAB — MICROALBUMIN / CREATININE URINE RATIO
Creatinine, Urine: 80.7 mg/dL
Microalb/Creat Ratio: 286 mg/g creat — ABNORMAL HIGH (ref 0–29)
Microalbumin, Urine: 231.2 ug/mL

## 2022-04-06 LAB — LIPID PANEL
Chol/HDL Ratio: 3 ratio (ref 0.0–5.0)
Cholesterol, Total: 130 mg/dL (ref 100–199)
HDL: 43 mg/dL (ref >39)
LDL Chol Calc (NIH): 58 mg/dL (ref 0–99)
Triglycerides: 176 mg/dL — ABNORMAL HIGH (ref 0–149)
VLDL Cholesterol Cal: 29 mg/dL (ref 5–40)

## 2022-04-06 LAB — CMP14+EGFR
ALT: 18 IU/L (ref 0–44)
AST: 16 IU/L (ref 0–40)
Albumin/Globulin Ratio: 1.4 (ref 1.2–2.2)
Albumin: 4 g/dL — ABNORMAL LOW (ref 4.1–5.1)
Alkaline Phosphatase: 30 IU/L — ABNORMAL LOW (ref 44–121)
BUN/Creatinine Ratio: 14 (ref 9–20)
BUN: 21 mg/dL (ref 6–24)
Bilirubin Total: 0.5 mg/dL (ref 0.0–1.2)
CO2: 24 mmol/L (ref 20–29)
Calcium: 9.1 mg/dL (ref 8.7–10.2)
Chloride: 100 mmol/L (ref 96–106)
Creatinine, Ser: 1.48 mg/dL — ABNORMAL HIGH (ref 0.76–1.27)
Globulin, Total: 2.8 g/dL (ref 1.5–4.5)
Glucose: 109 mg/dL — ABNORMAL HIGH (ref 70–99)
Potassium: 4.3 mmol/L (ref 3.5–5.2)
Sodium: 139 mmol/L (ref 134–144)
Total Protein: 6.8 g/dL (ref 6.0–8.5)
eGFR: 57 mL/min/{1.73_m2} — ABNORMAL LOW (ref >59)

## 2022-04-06 LAB — CBC WITH DIFFERENTIAL/PLATELET
Basophils Absolute: 0.1 10*3/uL (ref 0.0–0.2)
Basos: 1 %
EOS (ABSOLUTE): 0.3 10*3/uL (ref 0.0–0.4)
Eos: 3 %
Hematocrit: 47.4 % (ref 37.5–51.0)
Hemoglobin: 15.3 g/dL (ref 13.0–17.7)
Immature Grans (Abs): 0 10*3/uL (ref 0.0–0.1)
Immature Granulocytes: 1 %
Lymphocytes Absolute: 1.5 10*3/uL (ref 0.7–3.1)
Lymphs: 17 %
MCH: 27.6 pg (ref 26.6–33.0)
MCHC: 32.3 g/dL (ref 31.5–35.7)
MCV: 85 fL (ref 79–97)
Monocytes Absolute: 0.7 10*3/uL (ref 0.1–0.9)
Monocytes: 9 %
Neutrophils Absolute: 5.8 10*3/uL (ref 1.4–7.0)
Neutrophils: 69 %
Platelets: 217 10*3/uL (ref 150–450)
RBC: 5.55 x10E6/uL (ref 4.14–5.80)
RDW: 13.5 % (ref 11.6–15.4)
WBC: 8.4 10*3/uL (ref 3.4–10.8)

## 2022-04-06 LAB — VITAMIN D 25 HYDROXY (VIT D DEFICIENCY, FRACTURES): Vit D, 25-Hydroxy: 9.8 ng/mL — ABNORMAL LOW (ref 30.0–100.0)

## 2022-04-11 ENCOUNTER — Other Ambulatory Visit: Payer: Self-pay | Admitting: Family Medicine

## 2022-04-11 DIAGNOSIS — E559 Vitamin D deficiency, unspecified: Secondary | ICD-10-CM

## 2022-04-11 MED ORDER — VITAMIN D (ERGOCALCIFEROL) 1.25 MG (50000 UNIT) PO CAPS: 50000.0000 [IU] | ORAL_CAPSULE | ORAL | 3 refills | Status: AC

## 2022-07-03 ENCOUNTER — Other Ambulatory Visit: Payer: Self-pay | Admitting: Cardiology

## 2022-07-05 ENCOUNTER — Ambulatory Visit (INDEPENDENT_AMBULATORY_CARE_PROVIDER_SITE_OTHER): Payer: Medicare Other | Admitting: Family Medicine

## 2022-07-05 ENCOUNTER — Encounter: Payer: Self-pay | Admitting: Family Medicine

## 2022-07-05 VITALS — BP 134/94 | HR 95 | Temp 98.6°F | Ht 75.0 in | Wt 270.0 lb

## 2022-07-05 DIAGNOSIS — E1169 Type 2 diabetes mellitus with other specified complication: Secondary | ICD-10-CM

## 2022-07-05 DIAGNOSIS — I428 Other cardiomyopathies: Secondary | ICD-10-CM | POA: Diagnosis not present

## 2022-07-05 DIAGNOSIS — E1159 Type 2 diabetes mellitus with other circulatory complications: Secondary | ICD-10-CM | POA: Diagnosis not present

## 2022-07-05 DIAGNOSIS — I152 Hypertension secondary to endocrine disorders: Secondary | ICD-10-CM

## 2022-07-05 DIAGNOSIS — E559 Vitamin D deficiency, unspecified: Secondary | ICD-10-CM

## 2022-07-05 DIAGNOSIS — E1122 Type 2 diabetes mellitus with diabetic chronic kidney disease: Secondary | ICD-10-CM

## 2022-07-05 DIAGNOSIS — N1831 Chronic kidney disease, stage 3a: Secondary | ICD-10-CM

## 2022-07-05 DIAGNOSIS — E785 Hyperlipidemia, unspecified: Secondary | ICD-10-CM

## 2022-07-05 DIAGNOSIS — I5022 Chronic systolic (congestive) heart failure: Secondary | ICD-10-CM | POA: Diagnosis not present

## 2022-07-05 LAB — BAYER DCA HB A1C WAIVED: HB A1C (BAYER DCA - WAIVED): 7.2 % — ABNORMAL HIGH (ref 4.8–5.6)

## 2022-07-05 NOTE — Patient Instructions (Signed)
CUT BACK ON SALT.  GOAL blood pressure <140/90.  Your bottom number was too high. Send me your blood pressure readings in 3 weeks.

## 2022-07-05 NOTE — Progress Notes (Signed)
Subjective: CC:DM PCP: Janora Norlander, DO LT:[id number] Michael Ramos is a 52 y.o. male presenting to clinic today for:  1. Type 2 Diabetes with hypertension, hyperlipidemia associated with chronic systolic heart failure and cardiomyopathy:  Compliant with his medications.  No reports of genital abnormalities or dysuria.  He is brought to the office by his aunt today.  Compliant with medications for heart failure, blood pressure, cholesterol.  He has gone down to just 1 soda per week.  He continues to eat pretty much what he wants to otherwise  Last eye exam: Up-to-date Last foot exam: Needs Last A1c:  Lab Results  Component Value Date   HGBA1C 7.2 (H) 04/05/2022   Nephropathy screen indicated?:  Up-to-date Last flu, zoster and/or pneumovax:  Immunization History  Administered Date(s) Administered   Influenza,inj,Quad PF,6+ Mos 01/18/2016, 01/10/2017, 01/31/2018, 02/09/2020, 02/02/2022   Influenza-Unspecified 01/23/2021   PFIZER(Purple Top)SARS-COV-2 Vaccination 07/17/2019, 08/08/2019, 02/29/2020   Pneumococcal Polysaccharide-23 01/10/2017   Tdap 02/09/2020    ROS: Denies chest pain or shortness of breath.   ROS: Per HPI  Allergies  Allergen Reactions   Semaglutide Other (See Comments)    DIZZINESS with Rybelsus   Past Medical History:  Diagnosis Date   Allergy    Autism    CHF (congestive heart failure) ([place])    a. EF 20-25% by echo in 08/2016 b. repeat echo in 02/2017 showing EF 30-35% with cath showing normal cors.    Diabetes mellitus without complication (HCC)    Obesity (BMI 30-39.9) 10/23/2017    Current Outpatient Medications:    carvedilol (COREG) 12.5 MG tablet, Take 1 tablet (12.5 mg total) by mouth 2 (two) times daily., Disp: 180 tablet, Rfl: 3   diclofenac Sodium (VOLTAREN) 1 % GEL, Voltaren 1 % topical gel, Disp: , Rfl:    Empagliflozin-Linaglip-Metform (TRIJARDY XR) 25-08-998 MG TB24, Take 1 tablet by mouth daily. STOP RYBELSUS AND SYNJARDY, Disp: 30  tablet, Rfl: 12   furosemide (LASIX) 20 MG tablet, TAKE ONE TABLET BY MOUTH DAILY AS NEEDED, Disp: 90 tablet, Rfl: 0   glipiZIDE (GLUCOTROL) 5 MG tablet, Take 1 tablet (5 mg total) by mouth 2 (two) times daily before a meal., Disp: 180 tablet, Rfl: 3   loratadine (CLARITIN) 10 MG tablet, TAKE ONE (1) TABLET EACH DAY, Disp: 90 tablet, Rfl: 1   meclizine (ANTIVERT) 12.5 MG tablet, TAKE 1-2 TABLETS BY MOUTH 3 TIMES DAILY AS NEEDED FOR DIZZINESS, Disp: 30 tablet, Rfl: 1   pantoprazole (PROTONIX) 40 MG tablet, Take 1 tablet (40 mg total) by mouth 2 (two) times daily before a meal., Disp: 180 tablet, Rfl: 3   potassium chloride SA (KLOR-CON M) 20 MEQ tablet, TAKE TWO TABLETS BY MOUTH DAILY, Disp: 180 tablet, Rfl: 1   pravastatin (PRAVACHOL) 10 MG tablet, TAKE ONE (1) TABLET EACH DAY, Disp: 90 tablet, Rfl: 3   sacubitril-valsartan (ENTRESTO) 24-26 MG, Take 1 tablet by mouth 2 (two) times daily., Disp: 60 tablet, Rfl: 6   Vitamin D, Ergocalciferol, (DRISDOL) 1.25 MG (50000 UNIT) CAPS capsule, Take 1 capsule (50,000 Units total) by mouth every 7 (seven) days., Disp: 12 capsule, Rfl: 3 Social History   Socioeconomic History   Marital status: Single    Spouse name: Not on file   Number of children: Not on file   Years of education: Not on file   Highest education level: 12th grade  Occupational History   Occupation: Disabled  Tobacco Use   Smoking status: Never   Smokeless tobacco: Never  Vaping Use   Vaping Use: Never used  Substance and Sexual Activity   Alcohol use: No   Drug use: No   Sexual activity: Not Currently  Other Topics Concern   Not on file  Social History Narrative   Autistic   Lives with Mother   Social Determinants of Health   Financial Resource Strain: Low Risk  (02/28/2022)   Overall Financial Resource Strain (CARDIA)    Difficulty of Paying Living Expenses: Not hard at all  Food Insecurity: No Food Insecurity (02/28/2022)   Hunger Vital Sign    Worried About Running  Out of Food in the Last Year: Never true    Ran Out of Food in the Last Year: Never true  Transportation Needs: No Transportation Needs (02/28/2022)   PRAPARE - Hydrologist (Medical): No    Lack of Transportation (Non-Medical): No  Physical Activity: Insufficiently Active (02/28/2022)   Exercise Vital Sign    Days of Exercise per Week: 3 days    Minutes of Exercise per Session: 30 min  Stress: No Stress Concern Present (02/28/2022)   [place]    Feeling of Stress : Not at all  Social Connections: Moderately Isolated (02/28/2022)   Social Connection and Isolation Panel [NHANES]    Frequency of Communication with Friends and Family: More than three times a week    Frequency of Social Gatherings with Friends and Family: More than three times a week    Attends Religious Services: 1 to 4 times per year    Active Member of [employer] or Organizations: No    Attends Archivist Meetings: Never    Marital Status: Never married  Intimate Partner Violence: Not At Risk (02/28/2022)   Humiliation, Afraid, Rape, and Kick questionnaire    Fear of Current or Ex-Partner: No    Emotionally Abused: No    Physically Abused: No    Sexually Abused: No   Family History  Problem Relation Age of Onset   Diabetes Mother    Diabetes Father    Bladder Cancer Father    Colon cancer Neg Hx    Esophageal cancer Neg Hx    Liver cancer Neg Hx    Stomach cancer Neg Hx     Objective: Office vital signs reviewed. BP (!) 134/94   Pulse 95   Temp 98.6 F (37 C)   Ht 6\' 3"  (1.905 m)   Wt 270 lb (122.5 kg)   SpO2 96%   BMI 33.75 kg/m   Physical Examination:  General: Awake, alert, well nourished, No acute distress HEENT: sclera white, MMM Cardio: regular rate and rhythm, S1S2 heard, no murmurs appreciated Pulm: clear to auscultation bilaterally, no wheezes, rhonchi or rales; normal work of breathing  on room air GI: Protuberant abdomen Extremities: warm, well perfused, No edema, cyanosis or clubbing; +2 pulses bilaterally    Assessment/ Plan: 52 y.o. male   Type 2 diabetes mellitus with stage 3a chronic kidney disease, without long-term current use of insulin ([place]) - Plan: Renal Function Panel, Bayer DCA Hb A1c Waived  Hyperlipidemia associated with type 2 diabetes mellitus ([place])  Hypertension associated with type 2 diabetes mellitus (HCC)  Non-ischemic cardiomyopathy (HCC)  Chronic systolic heart failure (HCC)  Vitamin D deficiency - Plan: VITAMIN D 25 Hydroxy (Vit-D Deficiency, Fractures)  Check sugar.  Check renal function given CKD 3 A  Continue current cholesterol regimen  Blood pressure is not at goal  and we talked about salt restriction today.  I would like him to monitor blood pressures daily and if blood pressures do not come back down below 140/90 then we will need to talk about medication adjustments.  He has known nonischemic cardiomyopathy and chronic systolic heart failure so excess salt is really not ideal anyways for this patient.  Check vitamin D level.  Continue supplemental vitamin D as prescribed for now  Follow-up in 3 months for recheck  No orders of the defined types were placed in this encounter.  No orders of the defined types were placed in this encounter.    Janora Norlander, DO [place] [phone]

## 2022-07-07 LAB — RENAL FUNCTION PANEL
Albumin: 4.3 g/dL (ref 4.1–5.1)
BUN/Creatinine Ratio: 17 (ref 9–20)
BUN: 22 mg/dL (ref 6–24)
CO2: 21 mmol/L (ref 20–29)
Calcium: 10 mg/dL (ref 8.7–10.2)
Chloride: 100 mmol/L (ref 96–106)
Creatinine, Ser: 1.32 mg/dL — ABNORMAL HIGH (ref 0.76–1.27)
Glucose: 148 mg/dL — ABNORMAL HIGH (ref 70–99)
Phosphorus: 3.5 mg/dL (ref 2.8–4.1)
Potassium: 4.8 mmol/L (ref 3.5–5.2)
Sodium: 141 mmol/L (ref 134–144)
eGFR: 66 mL/min/{1.73_m2} (ref >59)

## 2022-07-07 LAB — VITAMIN D 25 HYDROXY (VIT D DEFICIENCY, FRACTURES): Vit D, 25-Hydroxy: 21.3 ng/mL — ABNORMAL LOW (ref 30.0–100.0)

## 2022-07-14 ENCOUNTER — Ambulatory Visit: Payer: Medicare Other | Attending: Cardiology | Admitting: Cardiology

## 2022-07-14 ENCOUNTER — Encounter: Payer: Self-pay | Admitting: Cardiology

## 2022-07-14 VITALS — BP 138/90 | HR 89 | Ht 75.0 in | Wt 270.0 lb

## 2022-07-14 DIAGNOSIS — I10 Essential (primary) hypertension: Secondary | ICD-10-CM | POA: Insufficient documentation

## 2022-07-14 DIAGNOSIS — I5032 Chronic diastolic (congestive) heart failure: Secondary | ICD-10-CM | POA: Insufficient documentation

## 2022-07-14 DIAGNOSIS — E782 Mixed hyperlipidemia: Secondary | ICD-10-CM | POA: Diagnosis not present

## 2022-07-14 MED ORDER — CARVEDILOL 12.5 MG PO TABS: 18.7500 mg | ORAL_TABLET | Freq: Two times a day (BID) | ORAL | 3 refills | Status: DC

## 2022-07-14 NOTE — Patient Instructions (Signed)
Medication Instructions:   INCREASE Coreg to 18.75 mg ( 1 1/2 tablets) twice a day  Labwork: None today  Testing/Procedures: None today  Follow-Up: 6 months  Any Other Special Instructions Will Be Listed Below (If Applicable).   Keep daily BP log- take BP 2 hours after taking medication, update our office in 2 weeks  If you need a refill on your cardiac medications before your next appointment, please call your pharmacy.

## 2022-07-14 NOTE — Progress Notes (Signed)
Clinical Summary Mr. Tiburcio PeaHarris is a 52 y.o.male seen today for follow up of the following medical problems.      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     07/2017 echo LVEF 35-40%, grade II diastolic dysfunction     09/2019 echo: LVEF 50-55%, no WMAs        - no SOB/DOE. No recent edema - compliant with meds - dizziness has resolved. Over last few visits had lowered his coreg and entresto to see if related        2. Nocturnal hypoxia/Possible sleep apnea - followed by Dr Mayford Knifeurner - drop in O2 sats noted during home oximetry. There were discussions about a formal sleep study however family does not feel patient could cooperate with study due to his autism     3. Autism   4. CKD     5. Hyperlipidemia -02/2021 TC 129 TG 196 HDL 37 LDL 60 03/2022 TC 130 TG 176 HDL 43 LDL 58      6. HTN - home bp's 140s-160s/90s-100s    SH: Has 7 brothers and sisters.  Past Medical History:  Diagnosis Date   Allergy    Autism    CHF (congestive heart failure) (HCC)    a. EF 20-25% by echo in 08/2016 b. repeat echo in 02/2017 showing EF 30-35% with cath showing normal cors.    Diabetes mellitus without complication (HCC)    Obesity (BMI 30-39.9) 10/23/2017     Allergies  Allergen Reactions   Semaglutide Other (See Comments)    DIZZINESS with Rybelsus     Current Outpatient Medications  Medication Sig Dispense Refill   carvedilol (COREG) 12.5 MG tablet Take 1 tablet (12.5 mg total) by mouth 2 (two) times daily. 180 tablet 3   diclofenac Sodium (VOLTAREN) 1 % GEL Voltaren 1 % topical gel     Empagliflozin-Linaglip-Metform (TRIJARDY XR) 25-08-998 MG TB24 Take 1 tablet by mouth daily. STOP RYBELSUS AND SYNJARDY 30 tablet 12   furosemide (LASIX) 20 MG tablet TAKE ONE TABLET BY MOUTH DAILY AS NEEDED 90 tablet 0   glipiZIDE (GLUCOTROL) 5 MG tablet Take 1  tablet (5 mg total) by mouth 2 (two) times daily before a meal. 180 tablet 3   loratadine (CLARITIN) 10 MG tablet TAKE ONE (1) TABLET EACH DAY 90 tablet 1   meclizine (ANTIVERT) 12.5 MG tablet TAKE 1-2 TABLETS BY MOUTH 3 TIMES DAILY AS NEEDED FOR DIZZINESS 30 tablet 1   pantoprazole (PROTONIX) 40 MG tablet Take 1 tablet (40 mg total) by mouth 2 (two) times daily before a meal. 180 tablet 3   potassium chloride SA (KLOR-CON M) 20 MEQ tablet TAKE TWO TABLETS BY MOUTH DAILY 180 tablet 1   pravastatin (PRAVACHOL) 10 MG tablet TAKE ONE (1) TABLET EACH DAY 90 tablet 3   sacubitril-valsartan (ENTRESTO) 24-26 MG Take 1 tablet by mouth 2 (two) times daily. 60 tablet 6   Vitamin D, Ergocalciferol, (DRISDOL) 1.25 MG (50000 UNIT) CAPS capsule Take 1 capsule (50,000 Units total) by mouth every 7 (seven) days. 12 capsule 3   No current facility-administered medications for this visit.     Past Surgical History:  Procedure Laterality Date   RIGHT/LEFT HEART CATH AND CORONARY ANGIOGRAPHY N/A 03/06/2017   Procedure: RIGHT/LEFT HEART CATH AND CORONARY ANGIOGRAPHY;  Surgeon: Kathleene HazelMcAlhany, Christopher D, MD;  Location: [facility];  Service: Cardiovascular;  Laterality: N/A;     Allergies  Allergen Reactions   Semaglutide Other (See Comments)    DIZZINESS with Rybelsus      Family History  Problem Relation Age of Onset   Diabetes Mother    Diabetes Father    Bladder Cancer Father    Colon cancer Neg Hx    Esophageal cancer Neg Hx    Liver cancer Neg Hx    Stomach cancer Neg Hx      Social History Mr. Geiss reports that he has never smoked. He has never used smokeless tobacco. Mr. Pepple reports no history of alcohol use.   Review of Systems CONSTITUTIONAL: No weight loss, fever, chills, weakness or fatigue.  HEENT: Eyes: No visual loss, blurred vision, double vision or yellow sclerae.No hearing loss, sneezing, congestion, runny nose or sore throat.  SKIN: No rash or itching.   CARDIOVASCULAR: per hpi RESPIRATORY: No shortness of breath, cough or sputum.  GASTROINTESTINAL: No anorexia, nausea, vomiting or diarrhea. No abdominal pain or blood.  GENITOURINARY: No burning on urination, no polyuria NEUROLOGICAL: No headache, dizziness, syncope, paralysis, ataxia, numbness or tingling in the extremities. No change in bowel or bladder control.  MUSCULOSKELETAL: No muscle, back pain, joint pain or stiffness.  LYMPHATICS: No enlarged nodes. No history of splenectomy.  PSYCHIATRIC: No history of depression or anxiety.  ENDOCRINOLOGIC: No reports of sweating, cold or heat intolerance. No polyuria or polydipsia.  Marland Kitchen   Physical Examination Today's Vitals   07/14/22 0957  BP: (!) 132/98  Pulse: 89  SpO2: 95%  Weight: 270 lb (122.5 kg)  Height: 6\' 3"  (1.905 m)   Body mass index is 33.75 kg/m.  Gen: resting comfortably, no acute distress HEENT: no scleral icterus, pupils equal round and reactive, no palptable cervical adenopathy,  CV: RRR, no m/rg, no jvd Resp: Clear to auscultation bilaterally GI: abdomen is soft, non-tender, non-distended, normal bowel sounds, no hepatosplenomegaly MSK: extremities are warm, no edema.  Skin: warm, no rash Neuro:  no focal deficits Psych: appropriate affect   Diagnostic Studies 02/2017 cath Hemodynamic findings consistent with mild pulmonary hypertension.   1. No angiographic evidence of CAD 2. Non-ischemic cardiomyopathy   Recommendations: medical management of cardiomyopathy and CHF.      02/2017 echo Study Conclusions   - Left ventricle: The cavity size was mildly dilated. Wall   thickness was normal. Systolic function was moderately to   severely reduced. The estimated ejection fraction was in the   range of 30% to 35%. Diffuse hypokinesis. Diastolic dysfunction,   grade indeterminate. Indeterminate filling pressures. - Mitral valve: There was mild regurgitation.     07/2017 echo Study Conclusions   - Left  ventricle: The cavity size was mildly dilated. Wall   thickness was normal. Systolic function was moderately reduced.   The estimated ejection fraction was in the range of 35% to 40%.   Diffuse hypokinesis. Features are consistent with a pseudonormal   left ventricular filling pattern, with concomitant abnormal   relaxation and increased filling pressure (grade 2 diastolic   dysfunction). - Mitral valve: There was mild regurgitation.     09/2019 echo 1. Left ventricular ejection fraction, by estimation, is 50 to 55%. The  left ventricle has normal function. The left ventricle has no regional  wall motion abnormalities. Left ventricular diastolic parameters were  normal.   2. Right ventricular systolic function is normal. The right ventricular  size is normal. There is normal pulmonary artery systolic pressure.  3. The mitral valve is normal in structure. No evidence of mitral valve  regurgitation. No evidence of mitral stenosis.   4. The aortic valve was not well visualized. Aortic valve regurgitation  is not visualized. No aortic stenosis is present.   5. The inferior vena cava is normal in size with greater than 50%  respiratory variability, suggesting right atrial pressure of 3 mmHg.    Assessment and Plan   1. HFimpEF/ NICM - by most recent echo LVEF has noramlzied -no symptoms, continue current meds. Coreg and entresto previously lowered due to dizziness which has resolved  2. HTN - elevated bp's. We had lowered coreg and entresto as reported above - significant HTN, try increasing coreg to 18.75mg  bid. Monitor bp's and any recurrent dizziness  3. Hyperlipidemia - LDL at goal, continue current meds       Antoine Poche, M.D.

## 2022-08-03 ENCOUNTER — Other Ambulatory Visit: Payer: Self-pay | Admitting: Family Medicine

## 2022-08-09 ENCOUNTER — Telehealth: Payer: Self-pay | Admitting: Family Medicine

## 2022-08-09 NOTE — Telephone Encounter (Signed)
Michael Ramos scheduled their annual wellness visit. Appointment made for 03/05/2023.  Thank you,  Judeth Cornfield,  AMB Clinical Support [facility] Direct Dial ??[phone]

## 2022-08-28 NOTE — Telephone Encounter (Signed)
Erroneous encounter will close.

## 2022-09-15 ENCOUNTER — Other Ambulatory Visit: Payer: Self-pay | Admitting: Family Medicine

## 2022-09-15 DIAGNOSIS — J3089 Other allergic rhinitis: Secondary | ICD-10-CM

## 2022-09-30 ENCOUNTER — Other Ambulatory Visit: Payer: Self-pay | Admitting: Cardiology

## 2022-10-11 ENCOUNTER — Other Ambulatory Visit: Payer: Self-pay | Admitting: Cardiology

## 2022-10-24 ENCOUNTER — Encounter: Payer: Self-pay | Admitting: Family Medicine

## 2022-10-24 ENCOUNTER — Ambulatory Visit (INDEPENDENT_AMBULATORY_CARE_PROVIDER_SITE_OTHER): Payer: Medicare Other | Admitting: Family Medicine

## 2022-10-24 VITALS — BP 129/84 | HR 90 | Temp 98.6°F | Ht 75.0 in | Wt 258.0 lb

## 2022-10-24 DIAGNOSIS — E1159 Type 2 diabetes mellitus with other circulatory complications: Secondary | ICD-10-CM | POA: Diagnosis not present

## 2022-10-24 DIAGNOSIS — E785 Hyperlipidemia, unspecified: Secondary | ICD-10-CM

## 2022-10-24 DIAGNOSIS — E1169 Type 2 diabetes mellitus with other specified complication: Secondary | ICD-10-CM | POA: Diagnosis not present

## 2022-10-24 DIAGNOSIS — E559 Vitamin D deficiency, unspecified: Secondary | ICD-10-CM | POA: Diagnosis not present

## 2022-10-24 DIAGNOSIS — Z7984 Long term (current) use of oral hypoglycemic drugs: Secondary | ICD-10-CM | POA: Diagnosis not present

## 2022-10-24 DIAGNOSIS — E1122 Type 2 diabetes mellitus with diabetic chronic kidney disease: Secondary | ICD-10-CM

## 2022-10-24 DIAGNOSIS — Z23 Encounter for immunization: Secondary | ICD-10-CM

## 2022-10-24 DIAGNOSIS — N1831 Chronic kidney disease, stage 3a: Secondary | ICD-10-CM | POA: Diagnosis not present

## 2022-10-24 DIAGNOSIS — I152 Hypertension secondary to endocrine disorders: Secondary | ICD-10-CM | POA: Diagnosis not present

## 2022-10-24 LAB — BAYER DCA HB A1C WAIVED: HB A1C (BAYER DCA - WAIVED): 10.4 % — ABNORMAL HIGH (ref 4.8–5.6)

## 2022-10-24 NOTE — Progress Notes (Signed)
Subjective: CC:DM PCP: Raliegh Ip, DO RUE:[id number] L Hellinger is a 52 y.o. male presenting to clinic today for:  1. Type 2 Diabetes with hypertension, hyperlipidemia:  Has been cutting back on sugar/ soda and walking daily.  Compliant with all meds. No missed doses  Last eye exam:  UTD Last foot exam: needs Last A1c:  Lab Results  Component Value Date   HGBA1C 7.2 (H) 07/05/2022   Nephropathy screen indicated?: UTD Last flu, zoster and/or pneumovax:  Immunization History  Administered Date(s) Administered   Influenza,inj,Quad PF,6+ Mos 01/18/2016, 01/10/2017, 01/31/2018, 02/09/2020, 02/02/2022   Influenza-Unspecified 01/23/2021   PFIZER(Purple Top)SARS-COV-2 Vaccination 07/17/2019, 08/08/2019, 02/29/2020   Pneumococcal Polysaccharide-23 01/10/2017   Tdap 02/09/2020   Zoster Recombinant(Shingrix) 10/24/2022    ROS: denies dizziness, LOC, polyuria, polydipsia, unintended weight loss/gain, foot ulcerations, numbness or tingling in extremities, shortness of breath or chest pain.  2. Vit D deficiency Compliant with weekly vit d rx.  ROS: Per HPI  Allergies  Allergen Reactions   Semaglutide Other (See Comments)    DIZZINESS with Rybelsus   Past Medical History:  Diagnosis Date   Allergy    Autism    CHF (congestive heart failure) (HCC)    a. EF 20-25% by echo in 08/2016 b. repeat echo in 02/2017 showing EF 30-35% with cath showing normal cors.    Diabetes mellitus without complication (HCC)    Obesity (BMI 30-39.9) 10/23/2017    Current Outpatient Medications:    carvedilol (COREG) 12.5 MG tablet, Take 1.5 tablets (18.75 mg total) by mouth 2 (two) times daily., Disp: 270 tablet, Rfl: 3   diclofenac Sodium (VOLTAREN) 1 % GEL, Voltaren 1 % topical gel, Disp: , Rfl:    Empagliflozin-Linaglip-Metform (TRIJARDY XR) 25-08-998 MG TB24, Take 1 tablet by mouth daily. STOP RYBELSUS AND SYNJARDY, Disp: 30 tablet, Rfl: 12   furosemide (LASIX) 20 MG tablet, TAKE ONE TABLET  BY MOUTH DAILY AS NEEDED, Disp: 90 tablet, Rfl: 1   glipiZIDE (GLUCOTROL) 5 MG tablet, Take 1 tablet (5 mg total) by mouth 2 (two) times daily before a meal., Disp: 180 tablet, Rfl: 3   loratadine (CLARITIN) 10 MG tablet, TAKE ONE (1) TABLET EACH DAY, Disp: 90 tablet, Rfl: 1   meclizine (ANTIVERT) 12.5 MG tablet, TAKE 1-2 TABLETS BY MOUTH 3 TIMES DAILY AS NEEDED FOR DIZZINESS, Disp: 30 tablet, Rfl: 1   pantoprazole (PROTONIX) 40 MG tablet, Take 1 tablet (40 mg total) by mouth 2 (two) times daily before a meal., Disp: 180 tablet, Rfl: 3   potassium chloride SA (KLOR-CON M) 20 MEQ tablet, TAKE TWO TABLETS BY MOUTH DAILY, Disp: 180 tablet, Rfl: 1   pravastatin (PRAVACHOL) 10 MG tablet, TAKE ONE (1) TABLET EACH DAY, Disp: 90 tablet, Rfl: 3   sacubitril-valsartan (ENTRESTO) 24-26 MG, Take 1 tablet by mouth 2 (two) times daily., Disp: 60 tablet, Rfl: 6   Vitamin D, Ergocalciferol, (DRISDOL) 1.25 MG (50000 UNIT) CAPS capsule, Take 1 capsule (50,000 Units total) by mouth every 7 (seven) days., Disp: 12 capsule, Rfl: 3 Social History   Socioeconomic History   Marital status: Single    Spouse name: Not on file   Number of children: Not on file   Years of education: Not on file   Highest education level: 12th grade  Occupational History   Occupation: Disabled  Tobacco Use   Smoking status: Never   Smokeless tobacco: Never  Vaping Use   Vaping status: Never Used  Substance and Sexual Activity   Alcohol  use: No   Drug use: No   Sexual activity: Not Currently  Other Topics Concern   Not on file  Social History Narrative   Autistic   Lives with Mother   Social Determinants of Health   Financial Resource Strain: Low Risk  (02/28/2022)   Overall Financial Resource Strain (CARDIA)    Difficulty of Paying Living Expenses: Not hard at all  Food Insecurity: No Food Insecurity (02/28/2022)   Hunger Vital Sign    Worried About Running Out of Food in the Last Year: Never true    Ran Out of Food in  the Last Year: Never true  Transportation Needs: No Transportation Needs (02/28/2022)   PRAPARE - Administrator, Civil Service (Medical): No    Lack of Transportation (Non-Medical): No  Physical Activity: Insufficiently Active (02/28/2022)   Exercise Vital Sign    Days of Exercise per Week: 3 days    Minutes of Exercise per Session: 30 min  Stress: No Stress Concern Present (02/28/2022)   [employer] of Occupational Health - Occupational Stress Questionnaire    Feeling of Stress : Not at all  Social Connections: Moderately Isolated (02/28/2022)   Social Connection and Isolation Panel [NHANES]    Frequency of Communication with Friends and Family: More than three times a week    Frequency of Social Gatherings with Friends and Family: More than three times a week    Attends Religious Services: 1 to 4 times per year    Active Member of [employer] or Organizations: No    Attends Banker Meetings: Never    Marital Status: Never married  Intimate Partner Violence: Not At Risk (02/28/2022)   Humiliation, Afraid, Rape, and Kick questionnaire    Fear of Current or Ex-Partner: No    Emotionally Abused: No    Physically Abused: No    Sexually Abused: No   Family History  Problem Relation Age of Onset   Diabetes Mother    Diabetes Father    Bladder Cancer Father    Colon cancer Neg Hx    Esophageal cancer Neg Hx    Liver cancer Neg Hx    Stomach cancer Neg Hx     Objective: Office vital signs reviewed. BP 129/84   Pulse 90   Temp 98.6 F (37 C)   Ht 6\' 3"  (1.905 m)   Wt 258 lb (117 kg)   SpO2 93%   BMI 32.25 kg/m   Physical Examination:  General: Awake, alert, well nourished, No acute distress HEENT: sclera white, MMM Cardio: regular rate and rhythm, S1S2 heard, no murmurs appreciated Pulm: clear to auscultation bilaterally, no wheezes, rhonchi or rales; normal work of breathing on room air Neuro: see DM foot Diabetic Foot Exam - Simple   Simple  Foot Form Diabetic Foot exam was performed with the following findings: Yes 10/24/2022  2:16 PM  Visual Inspection No deformities, no ulcerations, no other skin breakdown bilaterally: Yes Sensation Testing Intact to touch and monofilament testing bilaterally: Yes Pulse Check Posterior Tibialis and Dorsalis pulse intact bilaterally: Yes Comments Long toenails      Assessment/ Plan: 52 y.o. male   Type 2 diabetes mellitus with stage 3a chronic kidney disease, without long-term current use of insulin (HCC) - Plan: Bayer DCA Hb A1c Waived, Renal Function Panel, VITAMIN D 25 Hydroxy (Vit-D Deficiency, Fractures), Ambulatory referral to Podiatry  Hyperlipidemia associated with type 2 diabetes mellitus (HCC)  Hypertension associated with type 2 diabetes mellitus (HCC)  Vitamin D deficiency - Plan: VITAMIN D 25 Hydroxy (Vit-D Deficiency, Fractures)  Need for shingles vaccine  Sugar NOT at goal according to today's A1c.  I'm quite shocked at the level which was 10.4 today.  He has lost weight, been exercising and has cut back on soda.  This is a huge change from march, where A1c was 7.2.  I've asked that he come back for restick/ send off A1c to ensure accuracy.  May need to consider insulin therapy as he had intolerance to Rybelsus.  Referred to podiatry for nail care.  Continue statin/ BP meds.  BP well controlled.  Recheck Vit D level.  Shingles vaccine administered today.  No orders of the defined types were placed in this encounter.  No orders of the defined types were placed in this encounter.    Raliegh Ip, DO [facility] [phone]

## 2022-10-25 ENCOUNTER — Other Ambulatory Visit: Payer: Medicare Other

## 2022-10-25 ENCOUNTER — Other Ambulatory Visit: Payer: Self-pay | Admitting: Family Medicine

## 2022-10-25 DIAGNOSIS — E1122 Type 2 diabetes mellitus with diabetic chronic kidney disease: Secondary | ICD-10-CM

## 2022-10-25 DIAGNOSIS — N1831 Chronic kidney disease, stage 3a: Secondary | ICD-10-CM | POA: Diagnosis not present

## 2022-10-25 DIAGNOSIS — I5022 Chronic systolic (congestive) heart failure: Secondary | ICD-10-CM

## 2022-10-25 DIAGNOSIS — E1165 Type 2 diabetes mellitus with hyperglycemia: Secondary | ICD-10-CM

## 2022-10-25 DIAGNOSIS — N390 Urinary tract infection, site not specified: Secondary | ICD-10-CM

## 2022-10-25 LAB — RENAL FUNCTION PANEL
Albumin: 4.4 g/dL (ref 4.1–5.1)
BUN/Creatinine Ratio: 17 (ref 9–20)
BUN: 24 mg/dL (ref 6–24)
CO2: 25 mmol/L (ref 20–29)
Calcium: 10.2 mg/dL (ref 8.7–10.2)
Chloride: 97 mmol/L (ref 96–106)
Creatinine, Ser: 1.42 mg/dL — ABNORMAL HIGH (ref 0.76–1.27)
Glucose: 321 mg/dL — ABNORMAL HIGH (ref 70–99)
Phosphorus: 4.3 mg/dL — ABNORMAL HIGH (ref 2.8–4.1)
Potassium: 4.9 mmol/L (ref 3.5–5.2)
Sodium: 138 mmol/L (ref 134–144)
eGFR: 60 mL/min/{1.73_m2} (ref >59)

## 2022-10-25 LAB — VITAMIN D 25 HYDROXY (VIT D DEFICIENCY, FRACTURES): Vit D, 25-Hydroxy: 22.8 ng/mL — ABNORMAL LOW (ref 30.0–100.0)

## 2022-10-25 LAB — BAYER DCA HB A1C WAIVED: HB A1C (BAYER DCA - WAIVED): 10 % — ABNORMAL HIGH (ref 4.8–5.6)

## 2022-10-25 NOTE — Addendum Note (Signed)
Addended by: Lorelee Cover C on: 10/25/2022 10:26 AM   Modules accepted: Orders

## 2022-10-25 NOTE — Progress Notes (Signed)
Orders Placed This Encounter  Procedures   AMB Referral to Pharmacy Medication Management    Referral Priority:   Routine    Referral Type:   Consultation    Referral Reason:   Pharmacy Medication Management    Number of Visits Requested:   1

## 2022-10-27 ENCOUNTER — Telehealth: Payer: Self-pay

## 2022-10-27 NOTE — Telephone Encounter (Signed)
Patient's sister Darl Pikes called to check on getting an appointment set up with Raynelle Fanning in regards to his diabetes.  Referral was placed 10/25/22 as emergent. She states that patient glucose is running 200 and they need to discuss medication options as soon as possible.

## 2022-10-31 NOTE — Telephone Encounter (Signed)
Discussed with PCP Okay to see patient next Friday 11/03/22 Will likely start GLP1

## 2022-11-03 ENCOUNTER — Ambulatory Visit: Payer: Medicare Other | Admitting: Pharmacist

## 2022-11-03 ENCOUNTER — Telehealth: Payer: Self-pay | Admitting: Family Medicine

## 2022-11-03 DIAGNOSIS — Z7984 Long term (current) use of oral hypoglycemic drugs: Secondary | ICD-10-CM

## 2022-11-03 DIAGNOSIS — E1165 Type 2 diabetes mellitus with hyperglycemia: Secondary | ICD-10-CM | POA: Diagnosis not present

## 2022-11-03 DIAGNOSIS — E1169 Type 2 diabetes mellitus with other specified complication: Secondary | ICD-10-CM

## 2022-11-03 MED ORDER — METFORMIN HCL ER 500 MG PO TB24
500.0000 mg | ORAL_TABLET | Freq: Every evening | ORAL | 3 refills | Status: DC
Start: 2022-11-03 — End: 2023-11-09

## 2022-11-03 NOTE — Progress Notes (Signed)
11/03/2022 Name: Michael Ramos MRN: [id number] DOB: 1970/09/15  Chief Complaint  Patient presents with   Diabetes   Michael Ramos is a 52 y.o. year old male who was referred for medication management by their primary care provider, Delynn Flavin M, DO. They presented for a face to face visit today.  Patient's sisters were present for this visit and care for the patient.  The patient lives at home with his mother.  Patient's A1c has increased to 10% and the goal of today's visit is to change medication regimen.  Patient has improved dietary habits and increased physical activity.   They were referred to the pharmacist by their PCP for assistance in managing diabetes    Subjective:  Care Team: Primary Care Provider: Raliegh Ip, DO ; Next Scheduled Visit: 02/2023  Medication Access/Adherence  Current Pharmacy:  [facility] - [facility], [place] - [address] [place] [address] Phone: [phone] Fax: [phone]  [facility] [phone] - [place], [place] - [address] [place] [address] Phone: [phone] Fax: [phone]   Patient reports affordability concerns with their medications: No  Patient reports access/transportation concerns to their pharmacy: No  Patient reports adherence concerns with their medications:  No     Diabetes:  Current medications:  Trijardy XR Medications tried in the past: glipizide, Rybelsus  Current glucose readings: REPORTS <200 NOW IN THE 150s Buys OTC test strips/lancets at [facility]  Current meal patterns:  -Has worked to decrease snacks/sugary foods - Drinks 1 soda per week, loves water  Current physical activity: increased walking; walks 4 laps a day at home; starting to bowl  Current medication access support: medicare/medicaid   Objective:  Lab Results  Component Value Date   HGBA1C 10.0 (H) 10/25/2022    Lab  Results  Component Value Date   CREATININE 1.42 (H) 10/24/2022   BUN 24 10/24/2022   NA 138 10/24/2022   K 4.9 10/24/2022   CL 97 10/24/2022   CO2 25 10/24/2022    Lab Results  Component Value Date   CHOL 130 04/05/2022   HDL 43 04/05/2022   LDLCALC 58 04/05/2022   TRIG 176 (H) 04/05/2022   CHOLHDL 3.0 04/05/2022    Medications Reviewed Today     Reviewed by Danella Maiers, [facility] (Pharmacist) on 11/03/22 at 1028  Med List Status: <None>   Medication Order Taking? Sig Documenting Provider Last Dose Status Informant  carvedilol (COREG) 12.5 MG tablet [id number] No Take 1.5 tablets (18.75 mg total) by mouth 2 (two) times daily. Antoine Poche, MD Taking Active   diclofenac Sodium (VOLTAREN) 1 % GEL [id number] No Voltaren 1 % topical gel [provider] Taking Active   Empagliflozin-Linaglip-Metform (TRIJARDY XR) 25-08-998 MG TB24 [id number] No Take 1 tablet by mouth daily. STOP RYBELSUS AND SYNJARDY Raliegh Ip, DO Taking Active   furosemide (LASIX) 20 MG tablet [id number] No TAKE ONE TABLET BY MOUTH DAILY AS NEEDED Antoine Poche, MD Taking Active   loratadine (CLARITIN) 10 MG tablet [id number] No TAKE ONE (1) TABLET EACH DAY [place], [place], [place] Taking Active   meclizine (ANTIVERT) 12.5 MG tablet [id number] No TAKE 1-2 TABLETS BY MOUTH 3 TIMES DAILY AS NEEDED FOR DIZZINESS Gottschalk, Ashly M, DO Taking Active   metFORMIN (GLUCOPHAGE-XR) 500 MG 24 hr tablet [id number] Yes Take 1-2 tablets (500-1,000 mg total) by mouth every evening. With dinner Delynn Flavin  M, DO  Active   pantoprazole (PROTONIX) 40 MG tablet [id number] No Take 1 tablet (40 mg total) by mouth 2 (two) times daily before a meal. Delynn Flavin M, DO Taking Active   potassium chloride SA (KLOR-CON M) 20 MEQ tablet [id number] No TAKE TWO TABLETS BY MOUTH DAILY Branch, Dorothe Pea, MD Taking Active   pravastatin (PRAVACHOL) 10 MG tablet [id number] No TAKE ONE (1) TABLET EACH DAY Gottschalk, Ashly M,  DO Taking Active   sacubitril-valsartan (ENTRESTO) 24-26 MG [id number] No Take 1 tablet by mouth 2 (two) times daily. Antoine Poche, MD Taking Active   Vitamin D, Ergocalciferol, (DRISDOL) 1.25 MG (50000 UNIT) CAPS capsule [id number] No Take 1 capsule (50,000 Units total) by mouth every 7 (seven) days. Raliegh Ip, DO Taking Active              Assessment/Plan:   Diabetes: - Currently uncontrolled - Reviewed long term cardiovascular and renal outcomes of uncontrolled blood sugar - Reviewed goal A1c, goal fasting, and goal 2 hour post prandial glucose - Recommend to: Given A1c of 10% we likely need the addition on insulin or GLP1, however patient/family are weary of injections and do not wish to add injections at this time.  Discussed that we may have to add in the future pending labs.  Blood sugar does appear to be improving.  Continue Trijardy XR Will add metformin 500-100mg  XR with evening meal to attempt to add additional glycemic control (as tolerated); GFR 60 - Recommend to check glucose daily (fasting) or if symptomatic  Follow Up Plan: PCP in 3 months  Kieth Brightly, PharmD, BCACP Clinical Pharmacist, [facility]

## 2022-11-03 NOTE — Telephone Encounter (Signed)
Please call patients sister to get patient scheduled for a 3 mth follow up with Raynelle Fanning.

## 2022-11-19 IMAGING — DX DG HIP (WITH OR WITHOUT PELVIS) 2-3V*R*
2 series · 3 of 3 positions shown · non-contrast
Comparison: None.

CLINICAL DATA: Right hip pain.

EXAM:
DG HIP (WITH OR WITHOUT PELVIS) 2-3V RIGHT

[Series 1: hip ap · 0.14mm/px · 2 of 2 slices shown]
[im 1/2]
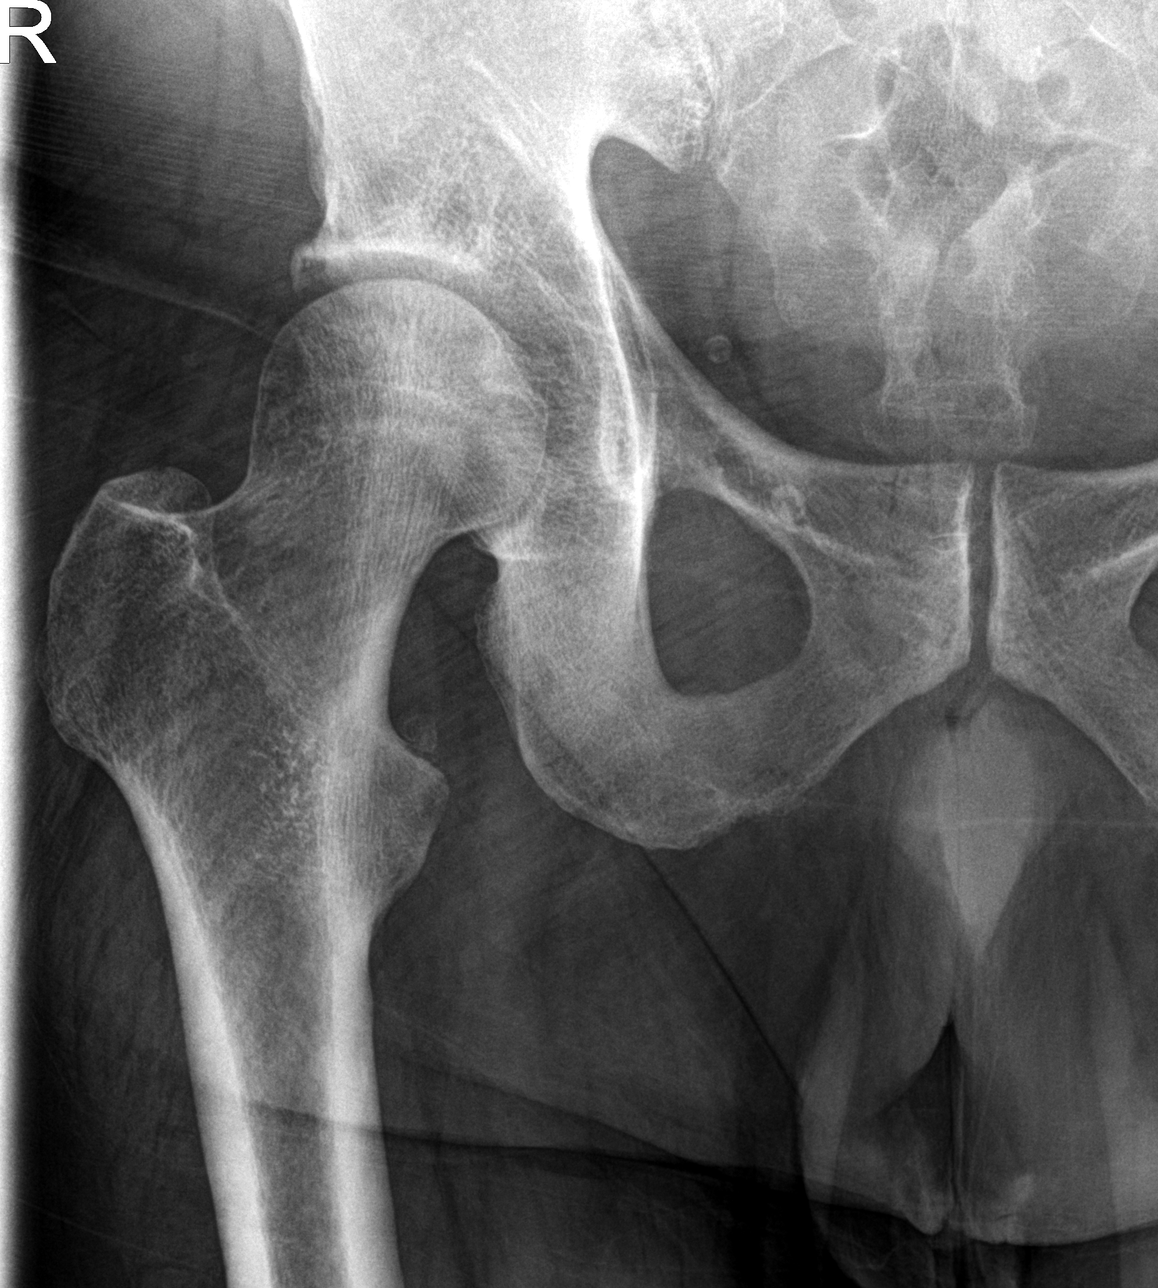
[im 2/2]
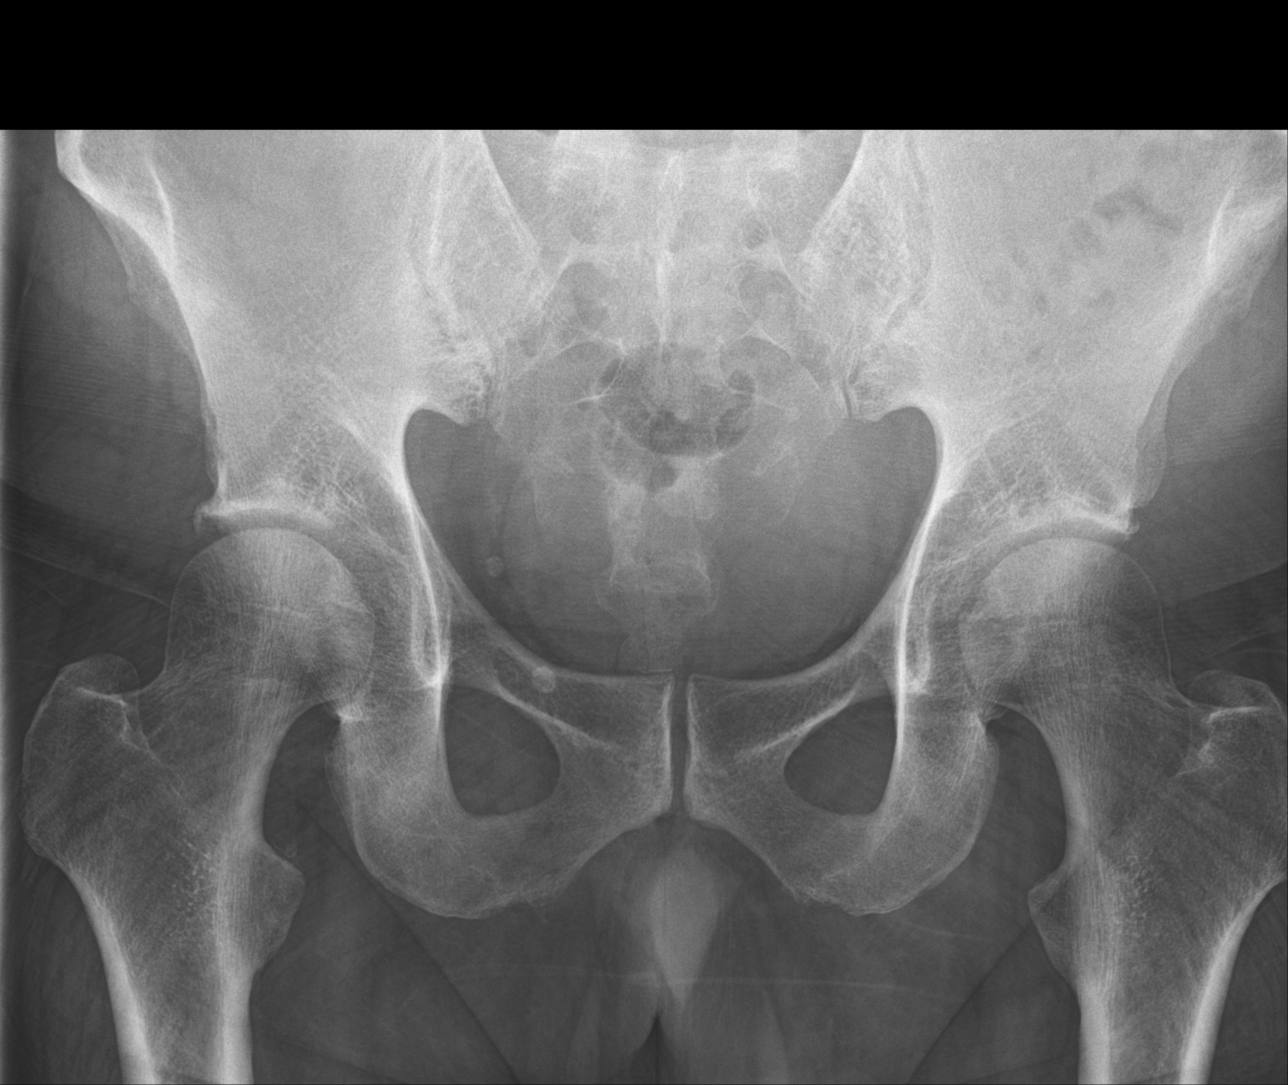

[hip lat]
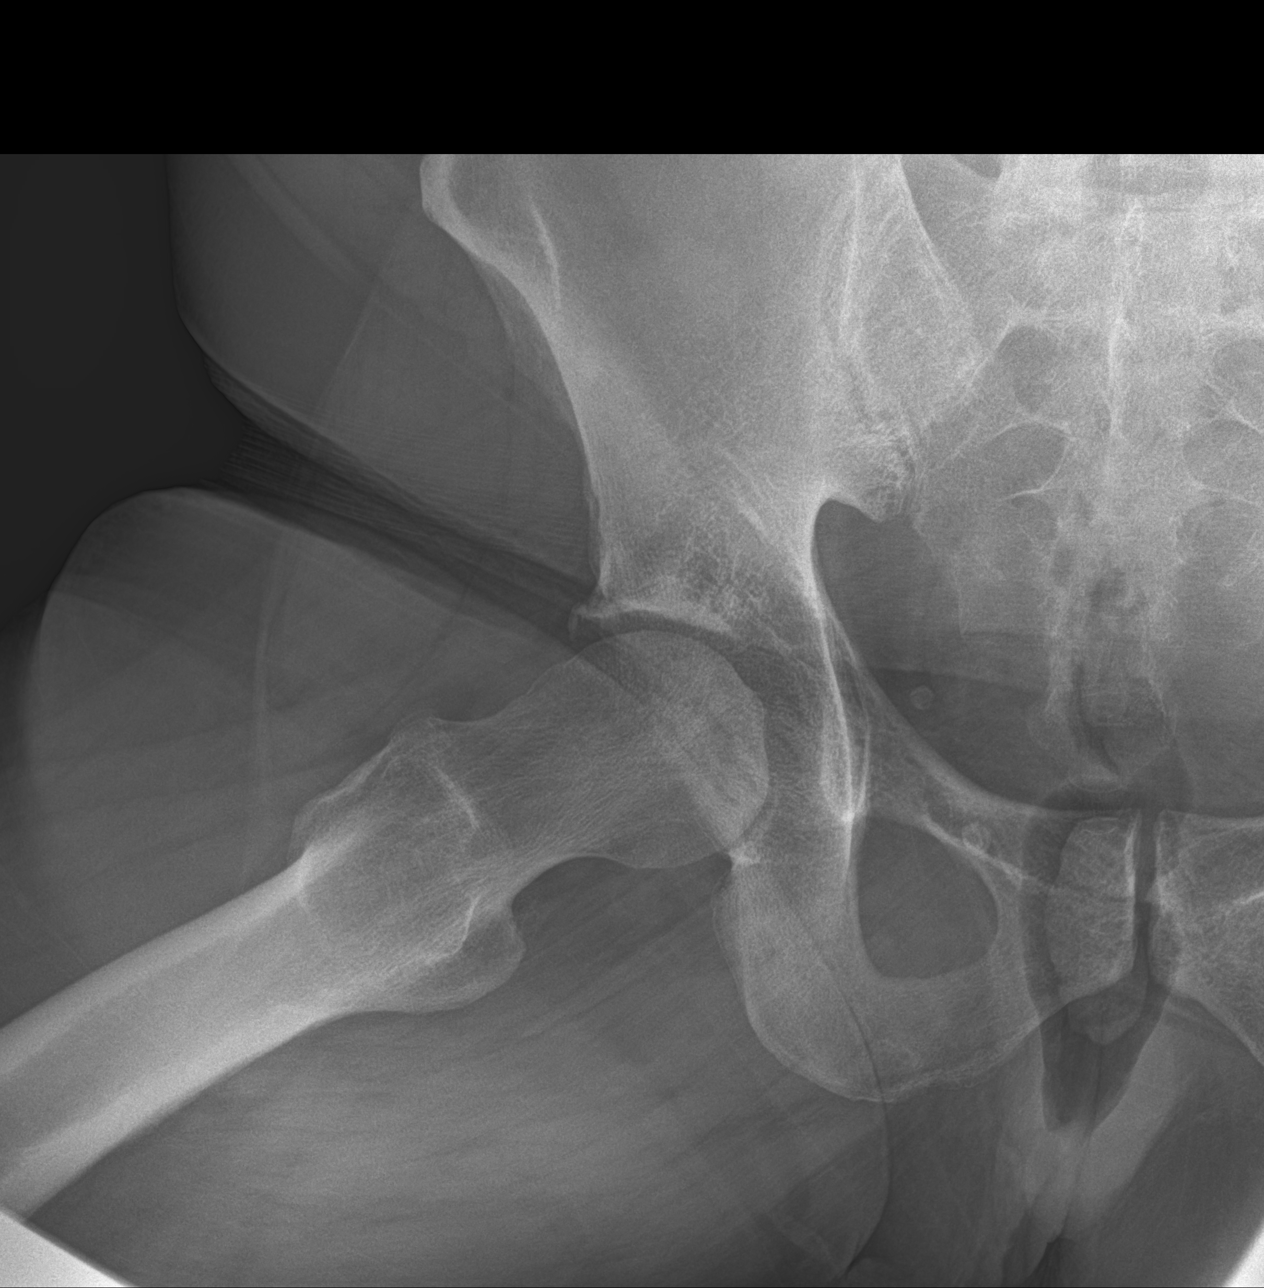

[3 of 3 positions shown; findings below may reference images not displayed]

FINDINGS: There is no evidence of hip fracture or dislocation. Mild
degenerative changes are seen in the form of acetabular sclerosis
and mild lateral acetabular bony spurring.
IMPRESSION: Mild degenerative changes in the right hip.

## 2022-11-23 DIAGNOSIS — H0288A Meibomian gland dysfunction right eye, upper and lower eyelids: Secondary | ICD-10-CM | POA: Diagnosis not present

## 2022-11-23 DIAGNOSIS — H0102A Squamous blepharitis right eye, upper and lower eyelids: Secondary | ICD-10-CM | POA: Diagnosis not present

## 2022-11-23 DIAGNOSIS — H0288B Meibomian gland dysfunction left eye, upper and lower eyelids: Secondary | ICD-10-CM | POA: Diagnosis not present

## 2022-11-23 DIAGNOSIS — H0102B Squamous blepharitis left eye, upper and lower eyelids: Secondary | ICD-10-CM | POA: Diagnosis not present

## 2022-11-23 DIAGNOSIS — H1045 Other chronic allergic conjunctivitis: Secondary | ICD-10-CM | POA: Diagnosis not present

## 2022-11-23 DIAGNOSIS — E119 Type 2 diabetes mellitus without complications: Secondary | ICD-10-CM | POA: Diagnosis not present

## 2022-11-23 LAB — HM DIABETES EYE EXAM

## 2022-12-14 DIAGNOSIS — E1142 Type 2 diabetes mellitus with diabetic polyneuropathy: Secondary | ICD-10-CM | POA: Diagnosis not present

## 2022-12-14 DIAGNOSIS — L84 Corns and callosities: Secondary | ICD-10-CM | POA: Diagnosis not present

## 2022-12-14 DIAGNOSIS — M79676 Pain in unspecified toe(s): Secondary | ICD-10-CM | POA: Diagnosis not present

## 2022-12-14 DIAGNOSIS — B351 Tinea unguium: Secondary | ICD-10-CM | POA: Diagnosis not present

## 2022-12-20 ENCOUNTER — Telehealth: Payer: Self-pay | Admitting: Cardiology

## 2022-12-20 ENCOUNTER — Telehealth: Payer: Self-pay | Admitting: Family Medicine

## 2022-12-20 NOTE — Telephone Encounter (Signed)
Attempted to call pt no answer - left vm   Please get pt on DOD

## 2022-12-20 NOTE — Telephone Encounter (Signed)
Spoke to pt's sister who stated that pt is straining to urinate. Pt sister stated this has been happening since starting Trijardy. Advised pt's sister that Cardiology did not prescribe medication- this came from PCP and to contact PCP.   Please advise

## 2022-12-20 NOTE — Telephone Encounter (Signed)
Pts sister called wanting to know if Dr Nadine Counts would work pt in to be seen today. She says pt is having trouble going to the bathroom and is very worried that something could be wrong with his kidneys. Please advise.

## 2022-12-20 NOTE — Telephone Encounter (Signed)
Sadly, I have nothing available. Can we get him in on DOD?

## 2022-12-20 NOTE — Telephone Encounter (Signed)
Patient's sister called stating her brother grunts when he has to go to the bathroom to urinate, she would like to have his kidneys checked.

## 2022-12-20 NOTE — Telephone Encounter (Signed)
Sister notified and stated she has just contacted PCP's office.

## 2022-12-21 ENCOUNTER — Ambulatory Visit (INDEPENDENT_AMBULATORY_CARE_PROVIDER_SITE_OTHER): Payer: Medicare Other | Admitting: Family Medicine

## 2022-12-21 VITALS — BP 123/82 | HR 94 | Temp 97.9°F | Ht 75.0 in | Wt 252.0 lb

## 2022-12-21 DIAGNOSIS — R351 Nocturia: Secondary | ICD-10-CM | POA: Diagnosis not present

## 2022-12-21 DIAGNOSIS — R300 Dysuria: Secondary | ICD-10-CM

## 2022-12-21 DIAGNOSIS — R399 Unspecified symptoms and signs involving the genitourinary system: Secondary | ICD-10-CM

## 2022-12-21 DIAGNOSIS — R3911 Hesitancy of micturition: Secondary | ICD-10-CM

## 2022-12-21 LAB — URINALYSIS, ROUTINE W REFLEX MICROSCOPIC
Bilirubin, UA: NEGATIVE
Ketones, UA: NEGATIVE
Leukocytes,UA: NEGATIVE
Nitrite, UA: NEGATIVE
Specific Gravity, UA: 1.01 (ref 1.005–1.030)
Urobilinogen, Ur: 0.2 mg/dL (ref 0.2–1.0)
pH, UA: 5 (ref 5.0–7.5)

## 2022-12-21 LAB — MICROSCOPIC EXAMINATION
Bacteria, UA: NONE SEEN
Epithelial Cells (non renal): NONE SEEN /HPF (ref 0–10)
Renal Epithel, UA: NONE SEEN /HPF
Yeast, UA: NONE SEEN

## 2022-12-21 MED ORDER — TAMSULOSIN HCL 0.4 MG PO CAPS
0.4000 mg | ORAL_CAPSULE | Freq: Every day | ORAL | 0 refills | Status: DC
Start: 2022-12-21 — End: 2023-03-16

## 2022-12-21 NOTE — Progress Notes (Signed)
Acute Office Visit  Subjective:     Patient ID: Michael Ramos, male    DOB: 06-24-1970, 52 y.o.   MRN: [id number]  Chief Complaint  Patient presents with   Dysuria   Here with sister today who helped to provide the history.  Urinary Frequency  This is a new problem. The current episode started more than 1 month ago. The problem occurs every urination. The problem has been unchanged. The patient is experiencing no pain. There has been no fever. He is Not sexually active. There is No history of pyelonephritis. Associated symptoms include frequency, hesitancy and urgency. Pertinent negatives include no chills, discharge, flank pain, hematuria, nausea, possible pregnancy, sweats or vomiting. He has tried nothing for the symptoms. There is no history of kidney stones, recurrent UTIs or a urological procedure.  Also with difficulty with stream, nocturia.    Review of Systems  Constitutional:  Negative for chills.  Gastrointestinal:  Negative for nausea and vomiting.  Genitourinary:  Positive for frequency, hesitancy and urgency. Negative for flank pain and hematuria.        Objective:    BP 123/82   Pulse 94   Temp 97.9 F (36.6 C) (Temporal)   Ht 6\' 3"  (1.905 m)   Wt 252 lb (114.3 kg)   SpO2 94%   BMI 31.50 kg/m    Physical Exam Vitals and nursing note reviewed.  Constitutional:      General: He is not in acute distress.    Appearance: He is not ill-appearing, toxic-appearing or diaphoretic.  Cardiovascular:     Rate and Rhythm: Normal rate and regular rhythm.     Heart sounds: Normal heart sounds. No murmur heard. Pulmonary:     Effort: Pulmonary effort is normal. No respiratory distress.     Breath sounds: Normal breath sounds.  Abdominal:     General: Bowel sounds are normal. There is no distension.     Palpations: Abdomen is soft.     Tenderness: There is no abdominal tenderness. There is no right CVA tenderness, left CVA tenderness, guarding or rebound.   Musculoskeletal:     Right lower leg: No edema.     Left lower leg: No edema.  Skin:    General: Skin is warm and dry.  Neurological:     Mental Status: He is alert and oriented to person, place, and time. Mental status is at baseline.  Psychiatric:        Mood and Affect: Mood normal.        Behavior: Behavior normal.     No results found for any visits on 12/21/22.      Assessment & Plan:   Aleksandar was seen today for dysuria.  Diagnoses and all orders for this visit:  Lower urinary tract symptoms Nocturia UA negative for infection. Will check labs as below and try flomax for symtpoms of BPH.  -     Urinalysis, Routine w reflex microscopic -     BMP8+EGFR -     PSA, total and free -     Microscopic Examination -     tamsulosin (FLOMAX) 0.4 MG CAPS capsule; Take 1 capsule (0.4 mg total) by mouth daily.  Return if symptoms worsen or fail to improve.  The patient indicates understanding of these issues and agrees with the plan.   Gabriel Earing, FNP

## 2022-12-21 NOTE — Telephone Encounter (Signed)
Apt scheduled.  

## 2022-12-22 ENCOUNTER — Encounter: Payer: Self-pay | Admitting: Family Medicine

## 2022-12-22 LAB — BMP8+EGFR
BUN/Creatinine Ratio: 16 (ref 9–20)
BUN: 24 mg/dL (ref 6–24)
CO2: 22 mmol/L (ref 20–29)
Calcium: 9.6 mg/dL (ref 8.7–10.2)
Chloride: 100 mmol/L (ref 96–106)
Creatinine, Ser: 1.51 mg/dL — ABNORMAL HIGH (ref 0.76–1.27)
Glucose: 153 mg/dL — ABNORMAL HIGH (ref 70–99)
Potassium: 4.7 mmol/L (ref 3.5–5.2)
Sodium: 137 mmol/L (ref 134–144)
eGFR: 56 mL/min/{1.73_m2} — ABNORMAL LOW (ref >59)

## 2022-12-22 LAB — PSA, TOTAL AND FREE
PSA, Free Pct: 26.7 %
PSA, Free: 0.24 ng/mL
Prostate Specific Ag, Serum: 0.9 ng/mL (ref 0.0–4.0)

## 2023-01-23 ENCOUNTER — Ambulatory Visit: Payer: Medicare Other

## 2023-01-29 DIAGNOSIS — Z23 Encounter for immunization: Secondary | ICD-10-CM | POA: Diagnosis not present

## 2023-01-31 NOTE — Progress Notes (Unsigned)
01/31/2023 Name: Michael Ramos MRN: [id number] DOB: 1970/08/31  No chief complaint on file.   Michael Ramos is a 52 y.o. year old male who was referred for medication management by their primary care provider, Delynn Flavin M, DO. They presented for a face to face visit today.   They were referred to the pharmacist by their PCP for assistance in managing diabetes    Subjective:  Care Team: Primary Care Provider: Raliegh Ip, DO ; Next Scheduled Visit: *** {careteamprovider:27366}  Medication Access/Adherence  Current Pharmacy:  [facility], [place] - [address] [place] [address] Phone: [phone] Fax: [phone]  [facility] [phone] - [place], [place] - [address] [place] [address] Phone: [phone] Fax: [phone]   Patient reports affordability concerns with their medications: {YES/NO:21197} Patient reports access/transportation concerns to their pharmacy: {YES/NO:21197} Patient reports adherence concerns with their medications:  {YES/NO:21197} ***   Diabetes:  Current medications:   -Trijardy XR--is he just taking the metformin alone now? -Metformin [phone] mg each evening (added 11/03/2022)  Medications tried in the past:  -Glipizide -Rybelsus (dizziness)   Current glucose readings: *** Using *** meter; testing *** times daily  @CGMDOWNLOAD @  Patient {Actions; denies-reports:120008} hypoglycemic s/sx including ***dizziness, shakiness, sweating. Patient {Actions; denies-reports:120008} hyperglycemic symptoms including ***polyuria, polydipsia, polyphagia, nocturia, neuropathy, blurred vision.  Current meal patterns:  - Breakfast: *** - Lunch *** - Supper *** - Snacks *** - Drinks ***  Current physical activity: ***  Current medication access support: ***   Objective:  Lab Results  Component Value Date   HGBA1C  10.0 (H) 10/25/2022    Lab Results  Component Value Date   CREATININE 1.51 (H) 12/21/2022   BUN 24 12/21/2022   NA 137 12/21/2022   K 4.7 12/21/2022   CL 100 12/21/2022   CO2 22 12/21/2022    Lab Results  Component Value Date   CHOL 130 04/05/2022   HDL 43 04/05/2022   LDLCALC 58 04/05/2022   TRIG 176 (H) 04/05/2022   CHOLHDL 3.0 04/05/2022    Medications Reviewed Today   Medications were not reviewed in this encounter       Assessment/Plan:   Diabetes: - Currently uncontrolled. A1c 10 in July 2024 (goal <7). Order repeat A1c. Repeat uACR (last recorded uACR in December 2023 was 286). Continue to monitor eGFR for Metformin dosing.  - Reviewed long term cardiovascular and renal outcomes of uncontrolled blood sugar - Reviewed goal A1c, goal fasting, and goal 2 hour post prandial glucose - Reviewed dietary modifications including *** - Reviewed lifestyle modifications including: - Recommend to ***  - Recommend to check glucose *** - Meets financial criteria for *** patient assistance program through ***. Will collaborate with provider, CPhT, and patient to pursue assistance.     Follow Up Plan: ***  ***

## 2023-02-01 ENCOUNTER — Ambulatory Visit: Payer: Medicare Other

## 2023-02-01 DIAGNOSIS — E119 Type 2 diabetes mellitus without complications: Secondary | ICD-10-CM

## 2023-02-02 ENCOUNTER — Ambulatory Visit: Payer: Medicare Other | Attending: Cardiology | Admitting: Cardiology

## 2023-02-02 ENCOUNTER — Encounter: Payer: Self-pay | Admitting: Cardiology

## 2023-02-02 VITALS — BP 118/76 | HR 76 | Ht 75.0 in | Wt 251.0 lb

## 2023-02-02 DIAGNOSIS — I10 Essential (primary) hypertension: Secondary | ICD-10-CM | POA: Insufficient documentation

## 2023-02-02 DIAGNOSIS — I5032 Chronic diastolic (congestive) heart failure: Secondary | ICD-10-CM | POA: Insufficient documentation

## 2023-02-02 DIAGNOSIS — E782 Mixed hyperlipidemia: Secondary | ICD-10-CM | POA: Insufficient documentation

## 2023-02-02 NOTE — Patient Instructions (Signed)
Medication Instructions:  Your physician recommends that you continue on your current medications as directed. Please refer to the Current Medication list given to you today.  *If you need a refill on your cardiac medications before your next appointment, please call your pharmacy*   Lab Work: None  If you have labs (blood work) drawn today and your tests are completely normal, you will receive your results only by: MyChart Message (if you have MyChart) OR A paper copy in the mail If you have any lab test that is abnormal or we need to change your treatment, we will call you to review the results.   Testing/Procedures: None   Follow-Up: At [facility], you and your health needs are our priority.  As part of our continuing mission to provide you with exceptional heart care, we have created designated Provider Care Teams.  These Care Teams include your primary Cardiologist (physician) and Advanced Practice Providers (APPs -  Physician Assistants and Nurse Practitioners) who all work together to provide you with the care you need, when you need it.  We recommend signing up for the patient portal called "MyChart".  Sign up information is provided on this After Visit Summary.  MyChart is used to connect with patients for Virtual Visits (Telemedicine).  Patients are able to view lab/test results, encounter notes, upcoming appointments, etc.  Non-urgent messages can be sent to your provider as well.   To learn more about what you can do with MyChart, go to [email or web address].    Your next appointment:   1 year(s)  Provider:   You may see Dina Rich, MD or one of the following Advanced Practice Providers on your designated Care Team:   Randall An, PA-C  Jacolyn Reedy, [place]     Other Instructions

## 2023-02-02 NOTE — Progress Notes (Signed)
Clinical Summary [name] is a 52 y.o.male seen today for follow up of the following medical problems.    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     07/2017 echo LVEF 35-40%, grade II diastolic dysfunction     09/2019 echo: LVEF 50-55%, no WMAs   - no SOB/DOE. No recent edema - compliant with meds - dizziness has resolved. Over last few visits had lowered his coreg and entresto to see if related   - no SOB/DOE, no recent edema - compliant with meds. No severe dizziness.          2. Nocturnal hypoxia/Possible sleep apnea - followed by Dr Mayford Knife - drop in O2 sats noted during home oximetry. There were discussions about a formal sleep study however family does not feel patient could cooperate with study due to his autism     3. Autism   4. CKD     5. Hyperlipidemia -02/2021 TC 129 TG 196 HDL 37 LDL 60 03/2022 TC 130 TG 176 HDL 43 LDL 58      6. HTN -compliant withmeds   - last visit increased coreg to 18.75mg  bid. Prevoiusly lowred due to dizziness but due to HTN last visit tried increasing dose     SH: Has 7 brothers and sisters.  Past Medical History:  Diagnosis Date   Allergy    Autism    CHF (congestive heart failure) (HCC)    a. EF 20-25% by echo in 08/2016 b. repeat echo in 02/2017 showing EF 30-35% with cath showing normal cors.    Diabetes mellitus without complication (HCC)    Obesity (BMI 30-39.9) 10/23/2017     Allergies  Allergen Reactions   Semaglutide Other (See Comments)    DIZZINESS with Rybelsus     Current Outpatient Medications  Medication Sig Dispense Refill   carvedilol (COREG) 12.5 MG tablet Take 1.5 tablets (18.75 mg total) by mouth 2 (two) times daily. 270 tablet 3   diclofenac Sodium (VOLTAREN) 1 % GEL Voltaren 1 % topical gel     Empagliflozin-Linaglip-Metform (TRIJARDY XR) 25-08-998 MG TB24 Take 1  tablet by mouth daily. STOP RYBELSUS AND SYNJARDY 30 tablet 12   furosemide (LASIX) 20 MG tablet TAKE ONE TABLET BY MOUTH DAILY AS NEEDED 90 tablet 1   loratadine (CLARITIN) 10 MG tablet TAKE ONE (1) TABLET EACH DAY 90 tablet 1   meclizine (ANTIVERT) 12.5 MG tablet TAKE 1-2 TABLETS BY MOUTH 3 TIMES DAILY AS NEEDED FOR DIZZINESS 30 tablet 1   metFORMIN (GLUCOPHAGE-XR) 500 MG 24 hr tablet Take 1-2 tablets (500-1,000 mg total) by mouth every evening. With dinner 60 tablet 3   pantoprazole (PROTONIX) 40 MG tablet Take 1 tablet (40 mg total) by mouth 2 (two) times daily before a meal. 180 tablet 3   potassium chloride SA (KLOR-CON M) 20 MEQ tablet TAKE TWO TABLETS BY MOUTH DAILY 180 tablet 1   pravastatin (PRAVACHOL) 10 MG tablet TAKE ONE (1) TABLET EACH DAY 90 tablet 3   sacubitril-valsartan (ENTRESTO) 24-26 MG Take 1 tablet by mouth 2 (two) times daily. 60 tablet 6   tamsulosin (FLOMAX) 0.4 MG CAPS capsule Take 1 capsule (0.4 mg total) by mouth daily. 90 capsule 0   Vitamin D, Ergocalciferol, (DRISDOL) 1.25 MG (50000 UNIT) CAPS capsule Take 1 capsule (50,000 Units total) by mouth every 7 (seven) days. 12 capsule  3   No current facility-administered medications for this visit.     Past Surgical History:  Procedure Laterality Date   RIGHT/LEFT HEART CATH AND CORONARY ANGIOGRAPHY N/A 03/06/2017   Procedure: RIGHT/LEFT HEART CATH AND CORONARY ANGIOGRAPHY;  Surgeon: Kathleene Hazel, MD;  Location: [facility];  Service: Cardiovascular;  Laterality: N/A;     Allergies  Allergen Reactions   Semaglutide Other (See Comments)    DIZZINESS with Rybelsus      Family History  Problem Relation Age of Onset   Diabetes Mother    Diabetes Father    Bladder Cancer Father    Colon cancer Neg Hx    Esophageal cancer Neg Hx    Liver cancer Neg Hx    Stomach cancer Neg Hx      Social History Mr. Scalzitti reports that he has never smoked. He has never used smokeless tobacco. Mr.  Norquist reports no history of alcohol use.   Review of Systems CONSTITUTIONAL: No weight loss, fever, chills, weakness or fatigue.  HEENT: Eyes: No visual loss, blurred vision, double vision or yellow sclerae.No hearing loss, sneezing, congestion, runny nose or sore throat.  SKIN: No rash or itching.  CARDIOVASCULAR: per hpi RESPIRATORY: No shortness of breath, cough or sputum.  GASTROINTESTINAL: No anorexia, nausea, vomiting or diarrhea. No abdominal pain or blood.  GENITOURINARY: No burning on urination, no polyuria NEUROLOGICAL: No headache, dizziness, syncope, paralysis, ataxia, numbness or tingling in the extremities. No change in bowel or bladder control.  MUSCULOSKELETAL: No muscle, back pain, joint pain or stiffness.  LYMPHATICS: No enlarged nodes. No history of splenectomy.  PSYCHIATRIC: No history of depression or anxiety.  ENDOCRINOLOGIC: No reports of sweating, cold or heat intolerance. No polyuria or polydipsia.  Marland Kitchen   Physical Examination Today's Vitals   02/02/23 1124  BP: 118/76  Pulse: 76  SpO2: 96%  Weight: 251 lb (113.9 kg)  Height: 6\' 3"  (1.905 m)   Body mass index is 31.37 kg/m.  Gen: resting comfortably, no acute distress HEENT: no scleral icterus, pupils equal round and reactive, no palptable cervical adenopathy,  CV: RRR, no m/rg, no jvd Resp: Clear to auscultation bilaterally GI: abdomen is soft, non-tender, non-distended, normal bowel sounds, no hepatosplenomegaly MSK: extremities are warm, no edema.  Skin: warm, no rash Neuro:  no focal deficits Psych: appropriate affect   Diagnostic Studies  02/2017 cath Hemodynamic findings consistent with mild pulmonary hypertension.   1. No angiographic evidence of CAD 2. Non-ischemic cardiomyopathy   Recommendations: medical management of cardiomyopathy and CHF.      02/2017 echo Study Conclusions   - Left ventricle: The cavity size was mildly dilated. Wall   thickness was normal. Systolic function  was moderately to   severely reduced. The estimated ejection fraction was in the   range of 30% to 35%. Diffuse hypokinesis. Diastolic dysfunction,   grade indeterminate. Indeterminate filling pressures. - Mitral valve: There was mild regurgitation.     07/2017 echo Study Conclusions   - Left ventricle: The cavity size was mildly dilated. Wall   thickness was normal. Systolic function was moderately reduced.   The estimated ejection fraction was in the range of 35% to 40%.   Diffuse hypokinesis. Features are consistent with a pseudonormal   left ventricular filling pattern, with concomitant abnormal   relaxation and increased filling pressure (grade 2 diastolic   dysfunction). - Mitral valve: There was mild regurgitation.     09/2019 echo 1. Left ventricular ejection fraction, by  estimation, is 50 to 55%. The  left ventricle has normal function. The left ventricle has no regional  wall motion abnormalities. Left ventricular diastolic parameters were  normal.   2. Right ventricular systolic function is normal. The right ventricular  size is normal. There is normal pulmonary artery systolic pressure.   3. The mitral valve is normal in structure. No evidence of mitral valve  regurgitation. No evidence of mitral stenosis.   4. The aortic valve was not well visualized. Aortic valve regurgitation  is not visualized. No aortic stenosis is present.   5. The inferior vena cava is normal in size with greater than 50%  respiratory variability, suggesting right atrial pressure of 3 mmHg.   Assessment and Plan   1. HFimpEF/ NICM - by most recent echo LVEF has noramlzied -no recent symptoms. Medical therapy limited by prior dizziness which resolved with down titrating meds - continue current meds   2. HTN - bp is at goal, continue current meds   3. Hyperlipidemia - at goal, he will continue current meds  F/u 1 year   Antoine Poche, M.D.

## 2023-03-02 ENCOUNTER — Ambulatory Visit: Payer: Medicare Other | Admitting: Family Medicine

## 2023-03-02 ENCOUNTER — Encounter: Payer: Self-pay | Admitting: Family Medicine

## 2023-03-02 ENCOUNTER — Encounter: Payer: Medicare Other | Admitting: Family Medicine

## 2023-03-02 VITALS — BP 130/83 | HR 82 | Temp 98.5°F | Ht 75.0 in | Wt 251.0 lb

## 2023-03-02 DIAGNOSIS — E1159 Type 2 diabetes mellitus with other circulatory complications: Secondary | ICD-10-CM | POA: Diagnosis not present

## 2023-03-02 DIAGNOSIS — E119 Type 2 diabetes mellitus without complications: Secondary | ICD-10-CM | POA: Diagnosis not present

## 2023-03-02 DIAGNOSIS — E785 Hyperlipidemia, unspecified: Secondary | ICD-10-CM | POA: Diagnosis not present

## 2023-03-02 DIAGNOSIS — Z23 Encounter for immunization: Secondary | ICD-10-CM

## 2023-03-02 DIAGNOSIS — Z7984 Long term (current) use of oral hypoglycemic drugs: Secondary | ICD-10-CM | POA: Diagnosis not present

## 2023-03-02 DIAGNOSIS — I152 Hypertension secondary to endocrine disorders: Secondary | ICD-10-CM | POA: Diagnosis not present

## 2023-03-02 DIAGNOSIS — E1169 Type 2 diabetes mellitus with other specified complication: Secondary | ICD-10-CM

## 2023-03-02 LAB — BAYER DCA HB A1C WAIVED: HB A1C (BAYER DCA - WAIVED): 8.5 % — ABNORMAL HIGH (ref 4.8–5.6)

## 2023-03-02 MED ORDER — SYNJARDY XR 25-1000 MG PO TB24
1.0000 | ORAL_TABLET | Freq: Every day | ORAL | 3 refills | Status: DC
Start: 2023-03-02 — End: 2024-02-11

## 2023-03-02 MED ORDER — RYBELSUS 14 MG PO TABS
14.0000 mg | ORAL_TABLET | Freq: Every day | ORAL | 3 refills | Status: DC
Start: 2023-03-02 — End: 2023-11-13

## 2023-03-02 MED ORDER — RYBELSUS 7 MG PO TABS
7.0000 mg | ORAL_TABLET | Freq: Every day | ORAL | 0 refills | Status: DC
Start: 2023-03-02 — End: 2023-08-28

## 2023-03-02 NOTE — Addendum Note (Signed)
Addended by: Raliegh Ip on: 03/02/2023 04:55 PM   Modules accepted: Orders

## 2023-03-02 NOTE — Progress Notes (Addendum)
Subjective: CC:DM PCP: Raliegh Ip, DO ZOX:[id number] Michael Ramos is a 52 y.o. male presenting to clinic today for:  1. Type 2 Diabetes with hypertension, hyperlipidemia:  Refused GLP/ insulin.  Instead additional metformin dose added in evening time.  Mom is reluctant for him to be on an injectable.  Sister accompanies him to today's visit and amenable to Rybelsus/ Synjardy combo again if clinically appropriate.  Though Orlin tells me that his blood sugars have been running 120s to 130s regularly.  Nothing in the 2 or 300s.  He continues to drink sodas that are sugar-free and Sunny D but also drinks water.  Diabetes Health Maintenance Due  Topic Date Due   HEMOGLOBIN A1C  04/27/2023   FOOT EXAM  10/24/2023   OPHTHALMOLOGY EXAM  11/23/2023    Last A1c:  Lab Results  Component Value Date   HGBA1C 10.0 (H) 10/25/2022    ROS: Denies any dysuria, hematuria, chest pain, shortness of breath.  Urine output has been better since placing him on Flomax   ROS: Per HPI  Allergies  Allergen Reactions   Semaglutide Other (See Comments)    DIZZINESS with Rybelsus   Past Medical History:  Diagnosis Date   Allergy    Autism    CHF (congestive heart failure) (HCC)    a. EF 20-25% by echo in 08/2016 b. repeat echo in 02/2017 showing EF 30-35% with cath showing normal cors.    Diabetes mellitus without complication (HCC)    Obesity (BMI 30-39.9) 10/23/2017    Current Outpatient Medications:    carvedilol (COREG) 12.5 MG tablet, Take 1.5 tablets (18.75 mg total) by mouth 2 (two) times daily., Disp: 270 tablet, Rfl: 3   diclofenac Sodium (VOLTAREN) 1 % GEL, Voltaren 1 % topical gel, Disp: , Rfl:    Empagliflozin-Linaglip-Metform (TRIJARDY XR) 25-08-998 MG TB24, Take 1 tablet by mouth daily. STOP RYBELSUS AND SYNJARDY, Disp: 30 tablet, Rfl: 12   furosemide (LASIX) 20 MG tablet, TAKE ONE TABLET BY MOUTH DAILY AS NEEDED, Disp: 90 tablet, Rfl: 1   loratadine (CLARITIN) 10 MG tablet, TAKE  ONE (1) TABLET EACH DAY, Disp: 90 tablet, Rfl: 1   meclizine (ANTIVERT) 12.5 MG tablet, TAKE 1-2 TABLETS BY MOUTH 3 TIMES DAILY AS NEEDED FOR DIZZINESS, Disp: 30 tablet, Rfl: 1   metFORMIN (GLUCOPHAGE-XR) 500 MG 24 hr tablet, Take 1-2 tablets (500-1,000 mg total) by mouth every evening. With dinner, Disp: 60 tablet, Rfl: 3   pantoprazole (PROTONIX) 40 MG tablet, Take 1 tablet (40 mg total) by mouth 2 (two) times daily before a meal., Disp: 180 tablet, Rfl: 3   potassium chloride SA (KLOR-CON M) 20 MEQ tablet, TAKE TWO TABLETS BY MOUTH DAILY, Disp: 180 tablet, Rfl: 1   pravastatin (PRAVACHOL) 10 MG tablet, TAKE ONE (1) TABLET EACH DAY, Disp: 90 tablet, Rfl: 3   sacubitril-valsartan (ENTRESTO) 24-26 MG, Take 1 tablet by mouth 2 (two) times daily., Disp: 60 tablet, Rfl: 6   tamsulosin (FLOMAX) 0.4 MG CAPS capsule, Take 1 capsule (0.4 mg total) by mouth daily., Disp: 90 capsule, Rfl: 0   Vitamin D, Ergocalciferol, (DRISDOL) 1.25 MG (50000 UNIT) CAPS capsule, Take 1 capsule (50,000 Units total) by mouth every 7 (seven) days., Disp: 12 capsule, Rfl: 3 Social History   Socioeconomic History   Marital status: Single    Spouse name: Not on file   Number of children: Not on file   Years of education: Not on file   Highest education level: 12th grade  Occupational History   Occupation: Disabled  Tobacco Use   Smoking status: Never   Smokeless tobacco: Never  Vaping Use   Vaping status: Never Used  Substance and Sexual Activity   Alcohol use: No   Drug use: No   Sexual activity: Not Currently  Other Topics Concern   Not on file  Social History Narrative   Autistic   Lives with Mother   Social Determinants of Health   Financial Resource Strain: Low Risk  (02/28/2022)   Overall Financial Resource Strain (CARDIA)    Difficulty of Paying Living Expenses: Not hard at all  Food Insecurity: No Food Insecurity (02/28/2022)   Hunger Vital Sign    Worried About Running Out of Food in the Last  Year: Never true    Ran Out of Food in the Last Year: Never true  Transportation Needs: No Transportation Needs (02/28/2022)   PRAPARE - Administrator, Civil Service (Medical): No    Lack of Transportation (Non-Medical): No  Physical Activity: Insufficiently Active (02/28/2022)   Exercise Vital Sign    Days of Exercise per Week: 3 days    Minutes of Exercise per Session: 30 min  Stress: No Stress Concern Present (02/28/2022)   [employer] of Occupational Health - Occupational Stress Questionnaire    Feeling of Stress : Not at all  Social Connections: Moderately Isolated (02/28/2022)   Social Connection and Isolation Panel [NHANES]    Frequency of Communication with Friends and Family: More than three times a week    Frequency of Social Gatherings with Friends and Family: More than three times a week    Attends Religious Services: 1 to 4 times per year    Active Member of [employer] or Organizations: No    Attends Banker Meetings: Never    Marital Status: Never married  Intimate Partner Violence: Not At Risk (02/28/2022)   Humiliation, Afraid, Rape, and Kick questionnaire    Fear of Current or Ex-Partner: No    Emotionally Abused: No    Physically Abused: No    Sexually Abused: No   Family History  Problem Relation Age of Onset   Diabetes Mother    Diabetes Father    Bladder Cancer Father    Colon cancer Neg Hx    Esophageal cancer Neg Hx    Liver cancer Neg Hx    Stomach cancer Neg Hx     Objective: Office vital signs reviewed. BP 130/83   Pulse 82   Temp 98.5 F (36.9 C)   Ht 6\' 3"  (1.905 m)   Wt 251 lb (113.9 kg)   SpO2 96%   BMI 31.37 kg/m   Physical Examination:  General: Awake, alert, well nourished, No acute distress HEENT: Sclera white.  Moist mucous membranes Cardio: regular rate and rhythm, S1S2 heard, no murmurs appreciated Pulm: clear to auscultation bilaterally, no wheezes, rhonchi or rales; normal work of breathing on room  air    Assessment/ Plan: 52 y.o. male   Diabetes mellitus treated with oral medication (HCC) - Plan: Bayer DCA Hb A1c Waived, Bayer DCA Hb A1c Waived, Semaglutide (RYBELSUS) 7 MG TABS, Semaglutide (RYBELSUS) 14 MG TABS, Empagliflozin-metFORMIN HCl ER (SYNJARDY XR) 25-1000 MG TB24  Hypertension associated with type 2 diabetes mellitus (HCC)  Hyperlipidemia associated with type 2 diabetes mellitus (HCC)  Need for shingles vaccine - Plan: Zoster Recombinant (Shingrix )   Recheck A1c.  Possible transition back to Rybelsus and Synjardy. **Addendum, sugar not at goal.  A1c somewhat down but at 8.5 today.  Will resume Rybelsus at 7 mg and Synjardy at full dosing.  May advance to 14 mg of Rybelsus in 1 month.  Blood pressure controlled.  No changes.  Continue current regimen for cholesterol control.  Second shingles vaccination administered  Raliegh Ip, DO [facility] [phone]

## 2023-03-05 ENCOUNTER — Ambulatory Visit: Payer: Medicare Other

## 2023-03-05 VITALS — Ht 75.0 in | Wt 251.0 lb

## 2023-03-05 DIAGNOSIS — Z0000 Encounter for general adult medical examination without abnormal findings: Secondary | ICD-10-CM | POA: Diagnosis not present

## 2023-03-05 NOTE — Patient Instructions (Addendum)
Mr. Sievers , Thank you for taking time to come for your Medicare Wellness Visit. I appreciate your ongoing commitment to your health goals. Please review the following plan we discussed and let me know if I can assist you in the future.   Referrals/Orders/Follow-Ups/Clinician Recommendations: Aim for 30 minutes of exercise or brisk walking, 6-8 glasses of water, and 5 servings of fruits and vegetables each day.  This is a list of the screening recommended for you and due dates:  Health Maintenance  Topic Date Due   COVID-19 Vaccine (4 - 2023-24 season) 03/18/2023*   Yearly kidney health urinalysis for diabetes  04/06/2023   Hemoglobin A1C  08/30/2023   Complete foot exam   10/24/2023   Eye exam for diabetics  11/23/2023   Yearly kidney function blood test for diabetes  12/21/2023   Medicare Annual Wellness Visit  03/04/2024   Colon Cancer Screening  11/19/2028   DTaP/Tdap/Td vaccine (2 - Td or Tdap) 02/08/2030   Flu Shot  Completed   Hepatitis C Screening  Completed   HIV Screening  Completed   Zoster (Shingles) Vaccine  Completed   HPV Vaccine  Aged Out  *Topic was postponed. The date shown is not the original due date.    Advanced directives: (ACP Link)Information on Advanced Care Planning can be found at [employer] Advance Health Care Directives Advance Health Care Directives ([email or web address]   Next Medicare Annual Wellness Visit scheduled for next year: Yes

## 2023-03-05 NOTE — Progress Notes (Signed)
Subjective:   Michael Ramos is a 52 y.o. male who presents for Medicare Annual/Subsequent preventive examination.  Visit Complete: Virtual I connected with  [name] on 03/05/23 by a audio enabled telemedicine application and verified that I am speaking with the correct person using two identifiers.  Patient Location: Home  Provider Location: Home Office  I discussed the limitations of evaluation and management by telemedicine. The patient expressed understanding and agreed to proceed.  Vital Signs: Because this visit was a virtual/telehealth visit, some criteria may be missing or patient reported. Any vitals not documented were not able to be obtained and vitals that have been documented are patient reported.  Cardiac Risk Factors include: advanced age (>17men, >58 women);male gender;hypertension;diabetes mellitus;dyslipidemia     Objective:    Today's Vitals   03/05/23 0939  Weight: 251 lb (113.9 kg)  Height: 6\' 3"  (1.905 m)   Body mass index is 31.37 kg/m.     03/05/2023    9:43 AM 02/28/2022   11:44 AM 02/04/2021   11:14 AM 10/25/2018   10:43 AM 12/31/2017    1:10 PM 03/06/2017   10:49 AM 08/15/2016    6:27 PM  Advanced Directives  Does Patient Have a Medical Advance Directive? No Yes No Yes No No Yes  Type of Furniture conservator/restorer;Living will  Healthcare Power of [employer] Power of Attorney  Does patient want to make changes to medical advance directive?    No - Patient declined   No - Patient declined  Copy of Healthcare Power of Attorney in Chart?  No - copy requested  No - copy requested   No - copy requested  Would patient like information on creating a medical advance directive? Yes (MAU/Ambulatory/Procedural Areas - Information given)  No - Patient declined  No - Patient declined No - Patient declined No - Patient declined    Current Medications (verified) Outpatient Encounter Medications as of 03/05/2023   Medication Sig   carvedilol (COREG) 12.5 MG tablet Take 1.5 tablets (18.75 mg total) by mouth 2 (two) times daily.   diclofenac Sodium (VOLTAREN) 1 % GEL Voltaren 1 % topical gel   Empagliflozin-metFORMIN HCl ER (SYNJARDY XR) 25-1000 MG TB24 Take 1 tablet by mouth daily. To replace Trijardy   furosemide (LASIX) 20 MG tablet TAKE ONE TABLET BY MOUTH DAILY AS NEEDED   loratadine (CLARITIN) 10 MG tablet TAKE ONE (1) TABLET EACH DAY   meclizine (ANTIVERT) 12.5 MG tablet TAKE 1-2 TABLETS BY MOUTH 3 TIMES DAILY AS NEEDED FOR DIZZINESS   metFORMIN (GLUCOPHAGE-XR) 500 MG 24 hr tablet Take 1-2 tablets (500-1,000 mg total) by mouth every evening. With dinner   pantoprazole (PROTONIX) 40 MG tablet Take 1 tablet (40 mg total) by mouth 2 (two) times daily before a meal.   potassium chloride SA (KLOR-CON M) 20 MEQ tablet TAKE TWO TABLETS BY MOUTH DAILY   pravastatin (PRAVACHOL) 10 MG tablet TAKE ONE (1) TABLET EACH DAY   sacubitril-valsartan (ENTRESTO) 24-26 MG Take 1 tablet by mouth 2 (two) times daily.   Semaglutide (RYBELSUS) 14 MG TABS Take 1 tablet (14 mg total) by mouth daily. Start mont #2   Semaglutide (RYBELSUS) 7 MG TABS Take 1 tablet (7 mg total) by mouth daily.   tamsulosin (FLOMAX) 0.4 MG CAPS capsule Take 1 capsule (0.4 mg total) by mouth daily.   Vitamin D, Ergocalciferol, (DRISDOL) 1.25 MG (50000 UNIT) CAPS capsule Take 1 capsule (50,000 Units total) by  mouth every 7 (seven) days.   No facility-administered encounter medications on file as of 03/05/2023.    Allergies (verified) Semaglutide   History: Past Medical History:  Diagnosis Date   Allergy    Autism    CHF (congestive heart failure) (HCC)    a. EF 20-25% by echo in 08/2016 b. repeat echo in 02/2017 showing EF 30-35% with cath showing normal cors.    Diabetes mellitus without complication (HCC)    Obesity (BMI 30-39.9) 10/23/2017   Past Surgical History:  Procedure Laterality Date   RIGHT/LEFT HEART CATH AND CORONARY  ANGIOGRAPHY N/A 03/06/2017   Procedure: RIGHT/LEFT HEART CATH AND CORONARY ANGIOGRAPHY;  Surgeon: Kathleene Hazel, MD;  Location: [facility];  Service: Cardiovascular;  Laterality: N/A;   Family History  Problem Relation Age of Onset   Diabetes Mother    Diabetes Father    Bladder Cancer Father    Colon cancer Neg Hx    Esophageal cancer Neg Hx    Liver cancer Neg Hx    Stomach cancer Neg Hx    Social History   Socioeconomic History   Marital status: Single    Spouse name: Not on file   Number of children: Not on file   Years of education: Not on file   Highest education level: 12th grade  Occupational History   Occupation: Disabled  Tobacco Use   Smoking status: Never   Smokeless tobacco: Never  Vaping Use   Vaping status: Never Used  Substance and Sexual Activity   Alcohol use: No   Drug use: No   Sexual activity: Not Currently  Other Topics Concern   Not on file  Social History Narrative   Autistic   Lives with Mother   Social Determinants of Health   Financial Resource Strain: Low Risk  (03/05/2023)   Overall Financial Resource Strain (CARDIA)    Difficulty of Paying Living Expenses: Not hard at all  Food Insecurity: No Food Insecurity (03/05/2023)   Hunger Vital Sign    Worried About Running Out of Food in the Last Year: Never true    Ran Out of Food in the Last Year: Never true  Transportation Needs: No Transportation Needs (03/05/2023)   PRAPARE - Administrator, Civil Service (Medical): No    Lack of Transportation (Non-Medical): No  Physical Activity: Insufficiently Active (03/05/2023)   Exercise Vital Sign    Days of Exercise per Week: 3 days    Minutes of Exercise per Session: 30 min  Stress: No Stress Concern Present (03/05/2023)   [employer] of Occupational Health - Occupational Stress Questionnaire    Feeling of Stress : Not at all  Social Connections: Moderately Isolated (03/05/2023)   Social Connection and  Isolation Panel [NHANES]    Frequency of Communication with Friends and Family: More than three times a week    Frequency of Social Gatherings with Friends and Family: Three times a week    Attends Religious Services: 1 to 4 times per year    Active Member of Clubs or Organizations: No    Attends Banker Meetings: Never    Marital Status: Never married    Tobacco Counseling Counseling given: Not Answered   Clinical Intake:  Pre-visit preparation completed: Yes  Pain : No/denies pain     Diabetes: Yes CBG done?: No Did pt. bring in CBG monitor from home?: No  How often do you need to have someone help you when you read  instructions, pamphlets, or other written materials from your doctor or pharmacy?: 1 - Never  Interpreter Needed?: No  Comments: Assisted with visit by sister- Blayk Riley Information entered by :: Kandis Fantasia LPN   Activities of Daily Living    03/05/2023    9:40 AM  In your present state of health, do you have any difficulty performing the following activities:  Hearing? 0  Vision? 0  Difficulty concentrating or making decisions? 1  Walking or climbing stairs? 0  Dressing or bathing? 0  Doing errands, shopping? 0  Preparing Food and eating ? N  Using the Toilet? N  In the past six months, have you accidently leaked urine? N  Do you have problems with loss of bowel control? N  Managing your Medications? N  Managing your Finances? N  Housekeeping or managing your Housekeeping? N    Patient Care Team: Raliegh Ip, DO as PCP - General (Family Medicine) Wyline Mood Dorothe Pea, MD as PCP - Cardiology (Cardiology) Danella Maiers, [facility] as Pharmacist (Family Medicine) [facility]  Indicate any recent Medical Services you may have received from other than [facility] providers in the past year (date may be approximate).     Assessment:   This is a routine wellness examination for [place].  Hearing/Vision  screen Hearing Screening - Comments:: Denies hearing difficulties   Vision Screening - Comments::  up to date with routine eye exams with [facility]     Goals Addressed   None   Depression Screen    03/05/2023    9:42 AM 03/02/2023    1:15 PM 10/24/2022    1:55 PM 07/05/2022    2:18 PM 04/05/2022   11:03 AM 04/05/2022   10:54 AM 02/28/2022   11:42 AM  PHQ 2/9 Scores  PHQ - 2 Score 0 0 0 0 0 0 0  PHQ- 9 Score 0 0 0 0 1      Fall Risk    03/05/2023    9:43 AM 03/02/2023    1:15 PM 10/24/2022    1:34 PM 04/05/2022   10:54 AM 02/28/2022   11:41 AM  Fall Risk   Falls in the past year? 0 0 0 0 0  Number falls in past yr: 0 0 0  0  Injury with Fall? 0 0 0  0  Risk for fall due to : No Fall Risks No Fall Risks No Fall Risks  No Fall Risks  Follow up Falls prevention discussed;Education provided;Falls evaluation completed Education provided Education provided  Falls prevention discussed    MEDICARE RISK AT HOME: Medicare Risk at Home Any stairs in or around the home?: No If so, are there any without handrails?: No Home free of loose throw rugs in walkways, pet beds, electrical cords, etc?: Yes Adequate lighting in your home to reduce risk of falls?: Yes Life alert?: No Use of a cane, walker or w/c?: No Grab bars in the bathroom?: Yes Shower chair or bench in shower?: No Elevated toilet seat or a handicapped toilet?: Yes  TIMED UP AND GO:  Was the test performed?  No    Cognitive Function:    03/05/2023    9:44 AM  MMSE - Mini Mental State Exam  Not completed: Unable to complete        02/28/2022   11:44 AM 10/25/2018   10:45 AM  6CIT Screen  What Year? 0 points --  What month? 0 points   What time? 0 points  Count back from 20 0 points   Months in reverse 0 points   Repeat phrase 0 points   Total Score 0 points     Immunizations Immunization History  Administered Date(s) Administered   Influenza,inj,Quad PF,6+ Mos 01/18/2016, 01/10/2017,  01/31/2018, 02/09/2020, 02/02/2022   Influenza-Unspecified 01/23/2021, 01/29/2023   PFIZER(Purple Top)SARS-COV-2 Vaccination 07/17/2019, 08/08/2019, 02/29/2020   Pneumococcal Polysaccharide-23 01/10/2017   Tdap 02/09/2020   Zoster Recombinant(Shingrix) 10/24/2022, 03/02/2023    TDAP status: Up to date  Flu Vaccine status: Up to date  Pneumococcal vaccine status: Up to date  Covid-19 vaccine status: Information provided on how to obtain vaccines.   Qualifies for Shingles Vaccine? Yes   Zostavax completed No   Shingrix Completed?: Yes  Screening Tests Health Maintenance  Topic Date Due   COVID-19 Vaccine (4 - 2023-24 season) 03/18/2023 (Originally 12/10/2022)   Diabetic kidney evaluation - Urine ACR  04/06/2023   HEMOGLOBIN A1C  08/30/2023   FOOT EXAM  10/24/2023   OPHTHALMOLOGY EXAM  11/23/2023   Diabetic kidney evaluation - eGFR measurement  12/21/2023   Medicare Annual Wellness (AWV)  03/04/2024   Colonoscopy  11/19/2028   DTaP/Tdap/Td (2 - Td or Tdap) 02/08/2030   INFLUENZA VACCINE  Completed   Hepatitis C Screening  Completed   HIV Screening  Completed   Zoster Vaccines- Shingrix  Completed   HPV VACCINES  Aged Out    Health Maintenance  There are no preventive care reminders to display for this patient.  Colorectal cancer screening: Type of screening: Colonoscopy. Completed 11/20/18. Repeat every 10 years  Lung Cancer Screening: (Low Dose CT Chest recommended if Age 39-80 years, 20 pack-year currently smoking OR have quit w/in 15years.) does not qualify.   Lung Cancer Screening Referral: n/a  Additional Screening:  Hepatitis C Screening: does qualify; Completed 08/09/20  Vision Screening: Recommended annual ophthalmology exams for early detection of glaucoma and other disorders of the eye. Is the patient up to date with their annual eye exam?  Yes  Who is the provider or what is the name of the office in which the patient attends annual eye exams? [facility] If pt is not established with a provider, would they like to be referred to a provider to establish care? No .   Dental Screening: Recommended annual dental exams for proper oral hygiene  Diabetic Foot Exam: Diabetic Foot Exam: Completed 10/24/22  Community Resource Referral / Chronic Care Management: CRR required this visit?  No   CCM required this visit?  No     Plan:     I have personally reviewed and noted the following in the patient's chart:   Medical and social history Use of alcohol, tobacco or illicit drugs  Current medications and supplements including opioid prescriptions. Patient is not currently taking opioid prescriptions. Functional ability and status Nutritional status Physical activity Advanced directives List of other physicians Hospitalizations, surgeries, and ER visits in previous 12 months Vitals Screenings to include cognitive, depression, and falls Referrals and appointments  In addition, I have reviewed and discussed with patient certain preventive protocols, quality metrics, and best practice recommendations. A written personalized care plan for preventive services as well as general preventive health recommendations were provided to patient.     Kandis Fantasia [place], [place]   29/52/[id number]   After Visit Summary: (MyChart) Due to this being a telephonic visit, the after visit summary with patients personalized plan was offered to patient via MyChart   Nurse Notes: No concerns at this  time

## 2023-03-15 DIAGNOSIS — E1142 Type 2 diabetes mellitus with diabetic polyneuropathy: Secondary | ICD-10-CM | POA: Diagnosis not present

## 2023-03-15 DIAGNOSIS — B351 Tinea unguium: Secondary | ICD-10-CM | POA: Diagnosis not present

## 2023-03-15 DIAGNOSIS — L84 Corns and callosities: Secondary | ICD-10-CM | POA: Diagnosis not present

## 2023-03-15 DIAGNOSIS — M79676 Pain in unspecified toe(s): Secondary | ICD-10-CM | POA: Diagnosis not present

## 2023-03-16 ENCOUNTER — Other Ambulatory Visit: Payer: Self-pay | Admitting: Cardiology

## 2023-03-16 ENCOUNTER — Other Ambulatory Visit: Payer: Self-pay | Admitting: Family Medicine

## 2023-03-16 DIAGNOSIS — R399 Unspecified symptoms and signs involving the genitourinary system: Secondary | ICD-10-CM

## 2023-03-16 DIAGNOSIS — R351 Nocturia: Secondary | ICD-10-CM

## 2023-03-23 ENCOUNTER — Other Ambulatory Visit: Payer: Self-pay | Admitting: Cardiology

## 2023-03-23 ENCOUNTER — Other Ambulatory Visit: Payer: Self-pay | Admitting: Family Medicine

## 2023-03-23 DIAGNOSIS — N1831 Chronic kidney disease, stage 3a: Secondary | ICD-10-CM

## 2023-05-04 ENCOUNTER — Other Ambulatory Visit (HOSPITAL_COMMUNITY): Payer: Self-pay

## 2023-05-04 ENCOUNTER — Telehealth: Payer: Self-pay

## 2023-05-04 NOTE — Telephone Encounter (Signed)
PA request has been Submitted. New Encounter created for follow up. For additional info see Pharmacy Prior Auth telephone encounter from 05/04/23.

## 2023-05-04 NOTE — Telephone Encounter (Signed)
Pharmacy Patient Advocate Encounter   Received notification from Pt Calls Messages that prior authorization for Rybelsus 7MG  tablets is required/requested.   Insurance verification completed.   The patient is insured through [facility] .   Per test claim: PA required; PA submitted to above mentioned insurance via CoverMyMeds Key/confirmation #/EOC [id number] Status is pending

## 2023-05-04 NOTE — Telephone Encounter (Signed)
Pharmacy Patient Advocate Encounter  Received notification from [facility] that Prior Authorization for Rybelsus has been APPROVED from 05/04/23 to 04/09/24. Ran test claim, Copay is $0. This test claim was processed through [facility]- copay amounts may vary at other pharmacies due to pharmacy/plan contracts, or as the patient moves through the different stages of their insurance plan.   PA #/Case ID/Reference #: [id number]

## 2023-05-04 NOTE — Telephone Encounter (Signed)
Semaglutide (RYBELSUS) 14 MG TABS   Semaglutide (RYBELSUS) 7 MG TABS  Needs PA for both

## 2023-06-14 DIAGNOSIS — L84 Corns and callosities: Secondary | ICD-10-CM | POA: Diagnosis not present

## 2023-06-14 DIAGNOSIS — M79676 Pain in unspecified toe(s): Secondary | ICD-10-CM | POA: Diagnosis not present

## 2023-06-14 DIAGNOSIS — B351 Tinea unguium: Secondary | ICD-10-CM | POA: Diagnosis not present

## 2023-06-14 DIAGNOSIS — E1142 Type 2 diabetes mellitus with diabetic polyneuropathy: Secondary | ICD-10-CM | POA: Diagnosis not present

## 2023-06-18 ENCOUNTER — Other Ambulatory Visit: Payer: Self-pay | Admitting: Cardiology

## 2023-06-18 ENCOUNTER — Other Ambulatory Visit: Payer: Self-pay | Admitting: Family Medicine

## 2023-06-18 DIAGNOSIS — R399 Unspecified symptoms and signs involving the genitourinary system: Secondary | ICD-10-CM

## 2023-06-18 DIAGNOSIS — R351 Nocturia: Secondary | ICD-10-CM

## 2023-06-26 ENCOUNTER — Other Ambulatory Visit: Payer: Self-pay | Admitting: Family Medicine

## 2023-06-26 DIAGNOSIS — R399 Unspecified symptoms and signs involving the genitourinary system: Secondary | ICD-10-CM

## 2023-06-26 DIAGNOSIS — R351 Nocturia: Secondary | ICD-10-CM

## 2023-06-26 MED ORDER — TAMSULOSIN HCL 0.4 MG PO CAPS: 0.4000 mg | ORAL_CAPSULE | Freq: Every day | ORAL | 0 refills | Status: DC

## 2023-06-26 NOTE — Addendum Note (Signed)
 Addended by: Julious Payer D on: 06/26/2023 11:27 AM   Modules accepted: Orders

## 2023-06-26 NOTE — Telephone Encounter (Signed)
 gottschalk pt NTBS 30-d given 06/19/23

## 2023-06-26 NOTE — Telephone Encounter (Signed)
 Scheduled appt on may 13

## 2023-07-23 ENCOUNTER — Other Ambulatory Visit: Payer: Self-pay | Admitting: Family Medicine

## 2023-07-23 DIAGNOSIS — R399 Unspecified symptoms and signs involving the genitourinary system: Secondary | ICD-10-CM

## 2023-07-23 DIAGNOSIS — R351 Nocturia: Secondary | ICD-10-CM

## 2023-07-31 ENCOUNTER — Telehealth (INDEPENDENT_AMBULATORY_CARE_PROVIDER_SITE_OTHER): Admitting: Family Medicine

## 2023-07-31 ENCOUNTER — Encounter: Payer: Self-pay | Admitting: Family Medicine

## 2023-07-31 DIAGNOSIS — J301 Allergic rhinitis due to pollen: Secondary | ICD-10-CM | POA: Diagnosis not present

## 2023-07-31 MED ORDER — DESLORATADINE 5 MG PO TABS
5.0000 mg | ORAL_TABLET | Freq: Every day | ORAL | 3 refills | Status: AC | PRN
Start: 2023-07-31 — End: ?

## 2023-07-31 MED ORDER — FLUTICASONE PROPIONATE 50 MCG/ACT NA SUSP: 2.0000 | Freq: Every day | NASAL | 6 refills | Status: AC

## 2023-07-31 NOTE — Progress Notes (Signed)
 MyChart Video visit  Subjective: Michael Ramos throat PCP: Michael Guerin, DO Michael Ramos is a 53 y.o. male. Patient provides verbal consent for consult held via video.  Due to COVID-19 pandemic this visit was conducted virtually. This visit type was conducted due to national recommendations for restrictions regarding the COVID-19 Pandemic (e.g. social distancing, sheltering in place) in an effort to limit this patient's exposure and mitigate transmission in our community. All issues noted in this document were discussed and addressed.  A physical exam was not performed with this format.   Location of patient: car Location of provider: [facility] Others present for call: sister  1. Sore throat She reports stuffy nose, sinus congestion and sore throat for 2-3 days now.  No fevers, shortness of breath, wheezing, nausea, vomiting, diarrhea.  No meds currently.    ROS: Per HPI  Allergies  Allergen Reactions   Semaglutide  Other (See Comments)    DIZZINESS with Rybelsus    Past Medical History:  Diagnosis Date   Allergy    Autism    CHF (congestive heart failure) (HCC)    a. EF 20-25% by echo in 08/2016 b. repeat echo in 02/2017 showing EF 30-35% with cath showing normal cors.    Diabetes mellitus without complication (HCC)    Obesity (BMI 30-39.9) 10/23/2017    Current Outpatient Medications:    carvedilol  (COREG ) 12.5 MG tablet, TAKE 1&1/2 TABLETS BY MOUTH TWICE DAILY, Disp: 270 tablet, Rfl: 3   diclofenac  Sodium (VOLTAREN ) 1 % GEL, Voltaren  1 % topical gel, Disp: , Rfl:    Empagliflozin -metFORMIN  HCl ER (SYNJARDY  XR) 25-1000 MG TB24, Take 1 tablet by mouth daily. To replace Trijardy , Disp: 90 tablet, Rfl: 3   furosemide  (LASIX ) 20 MG tablet, TAKE ONE TABLET BY MOUTH DAILY AS NEEDED, Disp: 90 tablet, Rfl: 2   loratadine  (CLARITIN ) 10 MG tablet, TAKE ONE (1) TABLET EACH DAY, Disp: 90 tablet, Rfl: 1   meclizine  (ANTIVERT ) 12.5 MG tablet, TAKE 1-2 TABLETS BY MOUTH 3 TIMES DAILY AS  NEEDED FOR DIZZINESS, Disp: 30 tablet, Rfl: 1   metFORMIN  (GLUCOPHAGE -XR) 500 MG 24 hr tablet, Take 1-2 tablets (500-1,000 mg total) by mouth every evening. With dinner, Disp: 60 tablet, Rfl: 3   pantoprazole  (PROTONIX ) 40 MG tablet, Take 1 tablet (40 mg total) by mouth 2 (two) times daily before a meal., Disp: 180 tablet, Rfl: 3   potassium chloride  SA (KLOR-CON  M) 20 MEQ tablet, TAKE TWO TABLETS BY MOUTH DAILY, Disp: 180 tablet, Rfl: 1   pravastatin  (PRAVACHOL ) 10 MG tablet, TAKE ONE (1) TABLET EACH DAY, Disp: 90 tablet, Rfl: 3   sacubitril -valsartan  (ENTRESTO ) 24-26 MG, Take 1 tablet by mouth 2 (two) times daily., Disp: 60 tablet, Rfl: 6   Semaglutide  (RYBELSUS ) 14 MG TABS, Take 1 tablet (14 mg total) by mouth daily. Start mont #2, Disp: 90 tablet, Rfl: 3   Semaglutide  (RYBELSUS ) 7 MG TABS, Take 1 tablet (7 mg total) by mouth daily., Disp: 30 tablet, Rfl: 0   tamsulosin  (FLOMAX ) 0.4 MG CAPS capsule, TAKE ONE CAPSULE BY MOUTH DAILY, Disp: 30 capsule, Rfl: 0   Vitamin D , Ergocalciferol , (DRISDOL ) 1.25 MG (50000 UNIT) CAPS capsule, Take 1 capsule (50,000 Units total) by mouth every 7 (seven) days., Disp: 12 capsule, Rfl: 3  Gen: Nontoxic-appearing  Assessment/ Plan: 53 y.o. male   Seasonal allergic rhinitis due to pollen - Plan: desloratadine  (CLARINEX ) 5 MG tablet, fluticasone  (FLONASE ) 50 MCG/ACT nasal spray  No evidence of secondary bacterial infection but we discussed signs  and symptoms of this and reasons to message me if they develop.  Glad to send antibiotic if needed.  In the meantime, encourage nasal saline use, Flonase  sent, Clarinex  ordered.  Start time: 1:29pm End time: 1:36pm  Total time spent on patient care (including video visit/ documentation): 8 minutes  Michael Churchwell [name], DO [facility] [phone]

## 2023-08-01 ENCOUNTER — Ambulatory Visit

## 2023-08-01 ENCOUNTER — Ambulatory Visit: Admitting: Family Medicine

## 2023-08-02 ENCOUNTER — Ambulatory Visit (INDEPENDENT_AMBULATORY_CARE_PROVIDER_SITE_OTHER): Admitting: Family Medicine

## 2023-08-02 ENCOUNTER — Encounter: Payer: Self-pay | Admitting: Family Medicine

## 2023-08-02 VITALS — BP 128/86 | HR 97 | Ht 75.0 in | Wt 244.0 lb

## 2023-08-02 DIAGNOSIS — J0130 Acute sphenoidal sinusitis, unspecified: Secondary | ICD-10-CM

## 2023-08-02 MED ORDER — AMOXICILLIN-POT CLAVULANATE 875-125 MG PO TABS: 1.0000 | ORAL_TABLET | Freq: Two times a day (BID) | ORAL | 0 refills | Status: DC

## 2023-08-02 NOTE — Progress Notes (Signed)
 BP 128/86   Pulse 97   Ht 6\' 3"  (1.905 m)   Wt 244 lb (110.7 kg)   SpO2 95%   BMI 30.50 kg/m    Subjective:   Patient ID: Michael Ramos, male    DOB: 1970/08/09, 53 y.o.   MRN: [id number]  HPI: Michael Ramos is a 53 y.o. male presenting on 08/02/2023 for URI   HPI Sinus pressure and headache and achiness. Patient is being brought in today by his sister and she says that he has been having some sinus congestion and drainage and sneezing and coughing and sore throat and a low-grade temperature and it is been going on for over a week now.  He has been trying over-the-counter stuff that was recommended a week ago including Flonase  and antihistamines and Tylenol  sinus and is just not improving.  Family is worried because his energy is down and he is complaining of headaches a lot and just not feeling well.  Relevant past medical, surgical, family and social history reviewed and updated as indicated. Interim medical history since our last visit reviewed. Allergies and medications reviewed and updated.  Review of Systems  Constitutional:  Positive for fever. Negative for chills.  HENT:  Positive for congestion, postnasal drip, rhinorrhea, sinus pressure, sinus pain, sneezing and sore throat. Negative for ear discharge, ear pain and voice change.   Eyes:  Negative for pain, discharge, redness and visual disturbance.  Respiratory:  Positive for cough. Negative for shortness of breath and wheezing.   Cardiovascular:  Negative for chest pain and leg swelling.  Musculoskeletal:  Positive for myalgias. Negative for gait problem.  Skin:  Negative for rash.  All other systems reviewed and are negative.   Per HPI unless specifically indicated above   Allergies as of 08/02/2023       Reactions   Semaglutide  Other (See Comments)   DIZZINESS with Rybelsus         Medication List        Accurate as of August 02, 2023 12:03 PM. If you have any questions, ask your nurse or doctor.           amoxicillin -clavulanate 875-125 MG tablet Commonly known as: AUGMENTIN  Take 1 tablet by mouth 2 (two) times daily. Started by: Lucio Sabin Cyana Shook   carvedilol  12.5 MG tablet Commonly known as: COREG  TAKE 1&1/2 TABLETS BY MOUTH TWICE DAILY   desloratadine  5 MG tablet Commonly known as: Clarinex  Take 1 tablet (5 mg total) by mouth daily as needed (allergies).   diclofenac  Sodium 1 % Gel Commonly known as: VOLTAREN  Voltaren  1 % topical gel   fluticasone  50 MCG/ACT nasal spray Commonly known as: FLONASE  Place 2 sprays into both nostrils daily.   furosemide  20 MG tablet Commonly known as: LASIX  TAKE ONE TABLET BY MOUTH DAILY AS NEEDED   meclizine  12.5 MG tablet Commonly known as: ANTIVERT  TAKE 1-2 TABLETS BY MOUTH 3 TIMES DAILY AS NEEDED FOR DIZZINESS   metFORMIN  500 MG 24 hr tablet Commonly known as: GLUCOPHAGE -XR Take 1-2 tablets (500-1,000 mg total) by mouth every evening. With dinner   pantoprazole  40 MG tablet Commonly known as: PROTONIX  Take 1 tablet (40 mg total) by mouth 2 (two) times daily before a meal.   potassium chloride  SA 20 MEQ tablet Commonly known as: KLOR-CON  M TAKE TWO TABLETS BY MOUTH DAILY   pravastatin  10 MG tablet Commonly known as: PRAVACHOL  TAKE ONE (1) TABLET EACH DAY   Rybelsus  7 MG Tabs Generic drug: Semaglutide  Take  1 tablet (7 mg total) by mouth daily.   Rybelsus  14 MG Tabs Generic drug: Semaglutide  Take 1 tablet (14 mg total) by mouth daily. Start mont #2   sacubitril -valsartan  24-26 MG Commonly known as: ENTRESTO  Take 1 tablet by mouth 2 (two) times daily.   Synjardy  XR 25-1000 MG Tb24 Generic drug: Empagliflozin -metFORMIN  HCl ER Take 1 tablet by mouth daily. To replace Trijardy    tamsulosin  0.4 MG Caps capsule Commonly known as: FLOMAX  TAKE ONE CAPSULE BY MOUTH DAILY   Vitamin D  (Ergocalciferol ) 1.25 MG (50000 UNIT) Caps capsule Commonly known as: DRISDOL  Take 1 capsule (50,000 Units total) by mouth every 7  (seven) days.         Objective:   BP 128/86   Pulse 97   Ht 6\' 3"  (1.905 m)   Wt 244 lb (110.7 kg)   SpO2 95%   BMI 30.50 kg/m   Wt Readings from Last 3 Encounters:  08/02/23 244 lb (110.7 kg)  03/05/23 251 lb (113.9 kg)  03/02/23 251 lb (113.9 kg)    Physical Exam Vitals and nursing note reviewed.  Constitutional:      General: He is not in acute distress.    Appearance: He is well-developed. He is not diaphoretic.  HENT:     Right Ear: Tympanic membrane, ear canal and external ear normal.     Left Ear: Tympanic membrane, ear canal and external ear normal.     Nose: Mucosal edema and rhinorrhea present.     Right Sinus: No maxillary sinus tenderness or frontal sinus tenderness.     Left Sinus: No maxillary sinus tenderness or frontal sinus tenderness.      Mouth/Throat:     Pharynx: Uvula midline. Posterior oropharyngeal erythema present. No oropharyngeal exudate.     Tonsils: No tonsillar abscesses.  Eyes:     General: No scleral icterus.       Right eye: No discharge.     Conjunctiva/sclera: Conjunctivae normal.     Pupils: Pupils are equal, round, and reactive to light.  Neck:     Thyroid : No thyromegaly.  Cardiovascular:     Rate and Rhythm: Normal rate and regular rhythm.     Heart sounds: Normal heart sounds. No murmur heard. Pulmonary:     Effort: Pulmonary effort is normal. No respiratory distress.     Breath sounds: Normal breath sounds. No wheezing or rales.  Musculoskeletal:        General: Normal range of motion.     Cervical back: Neck supple.  Lymphadenopathy:     Cervical: No cervical adenopathy.  Skin:    General: Skin is warm and dry.     Findings: No rash.  Neurological:     Mental Status: He is alert and oriented to person, place, and time.     Coordination: Coordination normal.  Psychiatric:        Behavior: Behavior normal.       Assessment & Plan:   Problem List Items Addressed This Visit   None Visit Diagnoses        Acute non-recurrent sphenoidal sinusitis    -  Primary   Relevant Medications   amoxicillin -clavulanate (AUGMENTIN ) 875-125 MG tablet       Sent amoxicillin  for him recommend that he continue the Flonase  and take Benadryl and use the Tylenol  Sinus. Follow up plan: Return if symptoms worsen or fail to improve.  Counseling provided for all of the vaccine components No orders of the defined types were placed  in this encounter.   Jolyne Needs, MD [facility] 08/02/2023, 12:03 PM

## 2023-08-07 ENCOUNTER — Emergency Department (HOSPITAL_COMMUNITY)

## 2023-08-07 ENCOUNTER — Ambulatory Visit: Payer: Self-pay

## 2023-08-07 ENCOUNTER — Encounter (HOSPITAL_COMMUNITY): Payer: Self-pay | Admitting: *Deleted

## 2023-08-07 ENCOUNTER — Emergency Department (HOSPITAL_COMMUNITY)
Admission: EM | Admit: 2023-08-07 | Discharge: 2023-08-07 | Disposition: A | Discharge status: Home / Self Care | Attending: Emergency Medicine | Admitting: Emergency Medicine

## 2023-08-07 ENCOUNTER — Other Ambulatory Visit: Payer: Self-pay

## 2023-08-07 ENCOUNTER — Telehealth: Admitting: Family Medicine

## 2023-08-07 ENCOUNTER — Telehealth: Payer: Self-pay

## 2023-08-07 DIAGNOSIS — R531 Weakness: Secondary | ICD-10-CM | POA: Diagnosis not present

## 2023-08-07 DIAGNOSIS — K4090 Unilateral inguinal hernia, without obstruction or gangrene, not specified as recurrent: Secondary | ICD-10-CM | POA: Diagnosis not present

## 2023-08-07 DIAGNOSIS — R42 Dizziness and giddiness: Secondary | ICD-10-CM | POA: Insufficient documentation

## 2023-08-07 DIAGNOSIS — K8020 Calculus of gallbladder without cholecystitis without obstruction: Secondary | ICD-10-CM

## 2023-08-07 DIAGNOSIS — R7989 Other specified abnormal findings of blood chemistry: Secondary | ICD-10-CM | POA: Insufficient documentation

## 2023-08-07 DIAGNOSIS — I428 Other cardiomyopathies: Secondary | ICD-10-CM

## 2023-08-07 DIAGNOSIS — F840 Autistic disorder: Secondary | ICD-10-CM | POA: Diagnosis not present

## 2023-08-07 DIAGNOSIS — R109 Unspecified abdominal pain: Secondary | ICD-10-CM | POA: Diagnosis not present

## 2023-08-07 DIAGNOSIS — I5022 Chronic systolic (congestive) heart failure: Secondary | ICD-10-CM

## 2023-08-07 DIAGNOSIS — D649 Anemia, unspecified: Secondary | ICD-10-CM | POA: Diagnosis not present

## 2023-08-07 DIAGNOSIS — K8081 Other cholelithiasis with obstruction: Secondary | ICD-10-CM

## 2023-08-07 DIAGNOSIS — R112 Nausea with vomiting, unspecified: Secondary | ICD-10-CM | POA: Diagnosis not present

## 2023-08-07 DIAGNOSIS — I10 Essential (primary) hypertension: Secondary | ICD-10-CM | POA: Diagnosis not present

## 2023-08-07 DIAGNOSIS — R55 Syncope and collapse: Secondary | ICD-10-CM | POA: Diagnosis not present

## 2023-08-07 DIAGNOSIS — I517 Cardiomegaly: Secondary | ICD-10-CM | POA: Diagnosis not present

## 2023-08-07 LAB — CBC WITH DIFFERENTIAL/PLATELET
Abs Immature Granulocytes: 0.05 10*3/uL (ref 0.00–0.07)
Basophils Absolute: 0.1 10*3/uL (ref 0.0–0.1)
Basophils Relative: 1 %
Eosinophils Absolute: 0.2 10*3/uL (ref 0.0–0.5)
Eosinophils Relative: 2 %
HCT: 47.9 % (ref 39.0–52.0)
Hemoglobin: 15.4 g/dL (ref 13.0–17.0)
Immature Granulocytes: 1 %
Lymphocytes Relative: 16 %
Lymphs Abs: 1.5 10*3/uL (ref 0.7–4.0)
MCH: 27.5 pg (ref 26.0–34.0)
MCHC: 32.2 g/dL (ref 30.0–36.0)
MCV: 85.5 fL (ref 80.0–100.0)
Monocytes Absolute: 0.6 10*3/uL (ref 0.1–1.0)
Monocytes Relative: 7 %
Neutro Abs: 6.8 10*3/uL (ref 1.7–7.7)
Neutrophils Relative %: 73 %
Platelets: 237 10*3/uL (ref 150–400)
RBC: 5.6 MIL/uL (ref 4.22–5.81)
RDW: 13.5 % (ref 11.5–15.5)
WBC: 9.2 10*3/uL (ref 4.0–10.5)
nRBC: 0 % (ref 0.0–0.2)

## 2023-08-07 LAB — COMPREHENSIVE METABOLIC PANEL WITH GFR
ALT: 13 U/L (ref 0–44)
AST: 14 U/L — ABNORMAL LOW (ref 15–41)
Albumin: 3 g/dL — ABNORMAL LOW (ref 3.5–5.0)
Alkaline Phosphatase: 27 U/L — ABNORMAL LOW (ref 38–126)
Anion gap: 8 (ref 5–15)
BUN: 29 mg/dL — ABNORMAL HIGH (ref 6–20)
CO2: 27 mmol/L (ref 22–32)
Calcium: 9 mg/dL (ref 8.9–10.3)
Chloride: 100 mmol/L (ref 98–111)
Creatinine, Ser: 1.61 mg/dL — ABNORMAL HIGH (ref 0.61–1.24)
GFR, Estimated: 51 mL/min — ABNORMAL LOW (ref >60)
Glucose, Bld: 195 mg/dL — ABNORMAL HIGH (ref 70–99)
Potassium: 4.7 mmol/L (ref 3.5–5.1)
Sodium: 135 mmol/L (ref 135–145)
Total Bilirubin: 0.7 mg/dL (ref 0.0–1.2)
Total Protein: 6.6 g/dL (ref 6.5–8.1)

## 2023-08-07 LAB — TROPONIN I (HIGH SENSITIVITY): Troponin I (High Sensitivity): 2 ng/L (ref <18)

## 2023-08-07 MED ORDER — IOHEXOL 300 MG/ML  SOLN
100.0000 mL | Freq: Once | INTRAMUSCULAR | Status: AC | PRN
  Administered 2023-08-07: 100 mL via INTRAVENOUS

## 2023-08-07 MED ORDER — SODIUM CHLORIDE 0.9 % IV BOLUS
500.0000 mL | Freq: Once | INTRAVENOUS | Status: AC
  Administered 2023-08-07: 500 mL via INTRAVENOUS

## 2023-08-07 NOTE — Telephone Encounter (Signed)
 Appointment scheduled.

## 2023-08-07 NOTE — Discharge Instructions (Signed)
 Please follow-up closely with your primary care doctor and general surgery on an outpatient basis.  Return to emergency department immediately for any new or worsening symptoms.

## 2023-08-07 NOTE — ED Triage Notes (Addendum)
 Pt having high BP readings at home of 164/105 and 151/105  Pt has been having episodes of n/v and dizziness  Pt is not having any pain at this time  Pt was recently diagnosed with a sinus infection and started taking amoxicillin  a couple of days ago  Pt started c/o dizziness for the last couple of weeks (3 x in 2 weeks pt has had a dizzy spell) pt has to lay down when the dizzy spell occurs

## 2023-08-07 NOTE — ED Provider Notes (Signed)
 [place] EMERGENCY DEPARTMENT AT [facility] Provider Note   CSN: [id number] Arrival date & time: 08/07/23  0102     History  Chief Complaint  Patient presents with   Hypertension    RAMAR GRASS is a 53 y.o. male.  Patient is a 53 year old male who presents to the emergency department with a chief complaint of dizziness, elevated blood pressure, and bout of nausea vomiting last night.  Patient is accompanied by his mother who notes provide the history given his history of autism.  Patient is currently on Augmentin  at this time secondary to sinusitis and ear infection.  Patient has been taking Claritin  as well but has not been taking any other over-the-counter medications.  Patient denies any chest pain, shortness of breath, abdominal pain.  There is been no associated fever or chills.  Dizziness has been intermittent in nature and nothing particular seems to make it better or worse.   Hypertension       Home Medications Prior to Admission medications   Medication Sig Start Date End Date Taking? Authorizing Provider  amoxicillin -clavulanate (AUGMENTIN ) 875-125 MG tablet Take 1 tablet by mouth 2 (two) times daily. 08/02/23   Dettinger, Lucio Sabin, MD  carvedilol  (COREG ) 12.5 MG tablet TAKE 1&1/2 TABLETS BY MOUTH TWICE DAILY 06/19/23   Laurann Pollock, MD  desloratadine  (CLARINEX ) 5 MG tablet Take 1 tablet (5 mg total) by mouth daily as needed (allergies). 07/31/23   Eliodoro Guerin, DO  diclofenac  Sodium (VOLTAREN ) 1 % GEL Voltaren  1 % topical gel    [provider]  Empagliflozin -metFORMIN  HCl ER (SYNJARDY  XR) 25-1000 MG TB24 Take 1 tablet by mouth daily. To replace Trijardy  03/02/23   Vicky Grange M, DO  fluticasone  (FLONASE ) 50 MCG/ACT nasal spray Place 2 sprays into both nostrils daily. 07/31/23   Eliodoro Guerin, DO  furosemide  (LASIX ) 20 MG tablet TAKE ONE TABLET BY MOUTH DAILY AS NEEDED 03/16/23   Laurann Pollock, MD  meclizine  (ANTIVERT )  12.5 MG tablet TAKE 1-2 TABLETS BY MOUTH 3 TIMES DAILY AS NEEDED FOR DIZZINESS 12/16/21   Vicky Grange M, DO  metFORMIN  (GLUCOPHAGE -XR) 500 MG 24 hr tablet Take 1-2 tablets (500-1,000 mg total) by mouth every evening. With dinner 11/03/22   Vicky Grange M, DO  pantoprazole  (PROTONIX ) 40 MG tablet Take 1 tablet (40 mg total) by mouth 2 (two) times daily before a meal. 04/05/22   Vicky Grange M, DO  potassium chloride  SA (KLOR-CON  M) 20 MEQ tablet TAKE TWO TABLETS BY MOUTH DAILY 03/23/23   Laurann Pollock, MD  pravastatin  (PRAVACHOL ) 10 MG tablet TAKE ONE (1) TABLET EACH DAY 04/05/22   Vicky Grange M, DO  sacubitril -valsartan  (ENTRESTO ) 24-26 MG Take 1 tablet by mouth 2 (two) times daily. 11/25/21   Laurann Pollock, MD  Semaglutide  (RYBELSUS ) 14 MG TABS Take 1 tablet (14 mg total) by mouth daily. Start mont #2 03/02/23   Eliodoro Guerin, DO  Semaglutide  (RYBELSUS ) 7 MG TABS Take 1 tablet (7 mg total) by mouth daily. 03/02/23   Eliodoro Guerin, DO  tamsulosin  (FLOMAX ) 0.4 MG CAPS capsule TAKE ONE CAPSULE BY MOUTH DAILY 07/23/23   Vicky Grange M, DO  Vitamin D , Ergocalciferol , (DRISDOL ) 1.25 MG (50000 UNIT) CAPS capsule Take 1 capsule (50,000 Units total) by mouth every 7 (seven) days. 04/11/22   Eliodoro Guerin, DO      Allergies    Semaglutide     Review of Systems   Review of Systems  Gastrointestinal:  Positive for nausea and vomiting.  Neurological:  Positive for dizziness.  All other systems reviewed and are negative.   Physical Exam Updated Vital Signs BP (!) 139/100   Pulse 71   Temp 97.7 F (36.5 C) (Oral)   Resp 19   Ht 6\' 3"  (1.905 m)   Wt 110.7 kg   SpO2 100%   BMI 30.50 kg/m  Physical Exam Vitals and nursing note reviewed.  Constitutional:      Appearance: Normal appearance.  HENT:     Head: Normocephalic and atraumatic.     Right Ear: Tympanic membrane, ear canal and external ear normal.     Left Ear: Tympanic membrane, ear canal  and external ear normal.     Nose: Nose normal. No congestion or rhinorrhea.     Mouth/Throat:     Mouth: Mucous membranes are moist.  Eyes:     Extraocular Movements: Extraocular movements intact.     Conjunctiva/sclera: Conjunctivae normal.     Pupils: Pupils are equal, round, and reactive to light.  Cardiovascular:     Rate and Rhythm: Normal rate and regular rhythm.     Pulses: Normal pulses.     Heart sounds: Normal heart sounds. No murmur heard.    No gallop.  Pulmonary:     Effort: Pulmonary effort is normal. No respiratory distress.     Breath sounds: Normal breath sounds. No stridor. No wheezing, rhonchi or rales.  Abdominal:     General: Abdomen is flat. Bowel sounds are normal. There is no distension.     Palpations: Abdomen is soft.     Tenderness: There is no abdominal tenderness. There is no guarding.  Musculoskeletal:        General: Normal range of motion.     Cervical back: Normal range of motion and neck supple.  Skin:    General: Skin is warm and dry.  Neurological:     General: No focal deficit present.     Mental Status: He is alert and oriented to person, place, and time. Mental status is at baseline.     Cranial Nerves: No cranial nerve deficit.     Sensory: No sensory deficit.     Motor: No weakness.     Coordination: Coordination normal.     Gait: Gait normal.  Psychiatric:        Mood and Affect: Mood normal.        Behavior: Behavior normal.        Thought Content: Thought content normal.        Judgment: Judgment normal.     ED Results / Procedures / Treatments   Labs (all labs ordered are listed, but only abnormal results are displayed) Labs Reviewed  COMPREHENSIVE METABOLIC PANEL WITH GFR - Abnormal; Notable for the following components:      Result Value   Glucose, Bld 195 (*)    BUN 29 (*)    Creatinine, Ser 1.61 (*)    Albumin 3.0 (*)    AST 14 (*)    Alkaline Phosphatase 27 (*)    GFR, Estimated 51 (*)    All other components  within normal limits  CBC WITH DIFFERENTIAL/PLATELET  URINALYSIS, ROUTINE W REFLEX MICROSCOPIC  TROPONIN I (HIGH SENSITIVITY)    EKG EKG Interpretation Date/Time:  Tuesday August 07 2023 10:58:54 EDT Ventricular Rate:  78 PR Interval:  152 QRS Duration:  89 QT Interval:  379 QTC Calculation: 432 R Axis:   35  Text  Interpretation: Sinus rhythm Borderline low voltage, extremity leads Baseline wander in lead(s) V4 No significant change since prior 9/19 Confirmed by Racheal Buddle [phone]) on 08/07/2023 10:59:58 AM  Radiology No results found.  Procedures Procedures    Medications Ordered in ED Medications  sodium chloride  0.9 % bolus 500 mL (500 mLs Intravenous New Bag/Given 08/07/23 1106)    ED Course/ Medical Decision Making/ A&P Clinical Course as of 08/07/23 1534  Tue Aug 07, 2023  1201 Normal sinus rhythm, rate of 78, normal PR/QRS interval, normal QTc, no ST/T wave changes, no ischemic changes, no STEMI, normal axis [CR]    Clinical Course User Index [CR] Roselynn Connors, PA-C                                 Medical Decision Making Amount and/or Complexity of Data Reviewed Labs: ordered. Radiology: ordered.  Risk Prescription drug management.   This patient presents to the ED for concern of dizziness, nausea and vomiting differential diagnosis includes acute cholecystitis, appendicitis, small bowel obstruction, diverticulitis, pyelonephritis, kidney stone, testicular torsion, electrolyte derangement, CVA, TIA, vertigo    Additional history obtained:  Additional history obtained from family External records from outside source obtained and reviewed including medical records   Lab Tests:  I Ordered, and personally interpreted labs.  The pertinent results include: Anemia, no leukocytosis, no anemia, normal electrolytes, elevated creatinine but at baseline, normal liver function, normal troponin   Imaging Studies ordered:  I ordered imaging studies  including CT scan of the abdomen and pelvis, CT scan of the head, chest x-ray I independently visualized and interpreted imaging which showed cholelithiasis, no acute surgical process within the abdomen and pelvis, no acute intracranial process, no acute cardiopulmonary process I agree with the radiologist interpretation   Medicines ordered and prescription drug management:  I ordered medication including interval, Zofran for nausea and vomiting Reevaluation of the patient after these medicines showed that the patient improved I have reviewed the patients home medicines and have made adjustments as needed   Problem List / ED Course:  Patient is doing well at this time and is stable for discharge home.  Discussed with patient and family that all workup in the emergency department has been unremarkable.  Do not suspect CVA or TIA at this point.  Patient has no concerning or neurological deficits on exam.  CT scan of the head was unremarkable.  CT scan of the abdomen pelvis demonstrated cholelithiasis without signs of acute cholecystitis.  Patient had no leukocytosis, changes in liver function or bilirubin.  Creatinine is at his baseline.  Blood pressure has been stable in the emergency department.  Did discuss with family that symptoms may be secondary to sleep apnea and he does need to follow-up with his primary care doctor regarding this.  Do not space any further advanced imaging or workup is warranted in the emergency department.  Strict turn precautions were discussed for any new or worsening symptoms.  Family voiced understanding to the plan and had no additional questions.   Social Determinants of Health:  None           Final Clinical Impression(s) / ED Diagnoses Final diagnoses:  None    Rx / DC Orders ED Discharge Orders     None         Emmalene Hare 08/07/23 1537    Tonya Fredrickson, MD 08/07/23 1715

## 2023-08-07 NOTE — ED Notes (Signed)
 Pt returned from CT and  now back on monitor.

## 2023-08-07 NOTE — Telephone Encounter (Unsigned)
 Copied from CRM [id number]. Topic: Appointments - Appointment Scheduling >> Aug 07, 2023  2:53 PM Star East wrote: Patient needs ED follow up- gall stones- needs referral and also a sleep study- Sister Amalia Badder needs a call back ASAP to speak with Dr. Margaretha Shave nurse- [phone]

## 2023-08-07 NOTE — Telephone Encounter (Signed)
  Chief Complaint: dizziness Symptoms: vomiting Frequency: 3 days Pertinent Negatives: Patient denies difficulty walking Disposition: [] ED /[] Urgent Care (no appt availability in office) / [x] Appointment(In office/virtual)/ []  [place]/ [] Home Care/ [] Refused Recommended Disposition /[] [place] [facility]/ []  Follow-up with PCP Additional Notes: Had to CAL to make appt. DOD is PCP but office able to schedule VV Copied from CRM [id number]. Topic: Clinical - Red Word Triage >> Aug 07, 2023  8:02 AM Opal Bill wrote: Red Word that prompted transfer to Nurse Triage: Dizzy spells for the last 3 days. Woke up today dizzy and vomiting. Reason for Disposition  [1] MILD dizziness (e.g., walking normally) AND [2] has been evaluated by doctor (or NP/PA) for this  Answer Assessment - Initial Assessment Questions 1. DESCRIPTION: "Describe your dizziness."     Arnetta Lank he says dizziness 4. SEVERITY: "How bad is it?"  "Do you feel like you are going to faint?" "Can you stand and walk?"   - MILD: Feels slightly dizzy, but walking normally.   - MODERATE: Feels unsteady when walking, but not falling; interferes with normal activities (e.g., school, work).   - SEVERE: Unable to walk without falling, or requires assistance to walk without falling; feels like passing out now.      mild 5. ONSET:  "When did the dizziness begin?"     3 days  8. CAUSE: "What do you think is causing the dizziness?"     Needs med adjustment 10. OTHER SYMPTOMS: "Do you have any other symptoms?" (e.g., fever, chest pain, vomiting, diarrhea, bleeding)       BP 160/105- dizziness, vomited-  Protocols used: Dizziness - Lightheadedness-A-AH

## 2023-08-08 NOTE — Telephone Encounter (Signed)
 No problem.referrals placed  Orders Placed This Encounter  Procedures   Ambulatory referral to Sleep Studies    Referral Priority:   Routine    Referral Type:   Consultation    Referral Reason:   Specialty Services Required    Number of Visits Requested:   1   Ambulatory referral to General Surgery    Referral Priority:   Routine    Referral Type:   Surgical    Referral Reason:   Specialty Services Required    Requested Specialty:   General Surgery    Number of Visits Requested:   1

## 2023-08-08 NOTE — Telephone Encounter (Signed)
 Called and spoke with susan she is aware that she needed an ER follow up, but since you will be out of town and they trust you they were wondering if you could do the referral for the gallstones and the sleep study referrals with out coming in. He has an appointment with you on 08/21/2023.

## 2023-08-08 NOTE — Telephone Encounter (Signed)
 Michael Ramos is aware.

## 2023-08-21 ENCOUNTER — Ambulatory Visit: Admitting: Family Medicine

## 2023-08-21 ENCOUNTER — Ambulatory Visit: Admitting: General Surgery

## 2023-08-27 ENCOUNTER — Other Ambulatory Visit: Payer: Self-pay | Admitting: Family Medicine

## 2023-08-27 DIAGNOSIS — R399 Unspecified symptoms and signs involving the genitourinary system: Secondary | ICD-10-CM

## 2023-08-27 DIAGNOSIS — R351 Nocturia: Secondary | ICD-10-CM

## 2023-08-28 ENCOUNTER — Ambulatory Visit: Admitting: Family Medicine

## 2023-08-28 NOTE — Progress Notes (Deleted)
 Subjective: CC:DM PCP: Eliodoro Guerin, DO BJY:NWGNFAO L Salo is a 53 y.o. male presenting to clinic today for:  1. Type 2 Diabetes with hypertension, hyperlipidemia:  Glucometer:***.   High at home: ***; Low at home: ***, Taking medication(s): ***,.  Diabetes Health Maintenance Due  Topic Date Due   HEMOGLOBIN A1C  08/30/2023   FOOT EXAM  10/24/2023   OPHTHALMOLOGY EXAM  11/23/2023    Last A1c:  Lab Results  Component Value Date   HGBA1C 8.5 (H) 03/02/2023    ROS: ***dizziness, LOC, polyuria, polydipsia, unintended weight loss/gain, foot ulcerations, numbness or tingling in extremities, shortness of breath or chest pain.   ROS: Per HPI  Allergies  Allergen Reactions   Semaglutide  Other (See Comments)    DIZZINESS with Rybelsus    Past Medical History:  Diagnosis Date   Allergy    Autism    CHF (congestive heart failure) (HCC)    a. EF 20-25% by echo in 08/2016 b. repeat echo in 02/2017 showing EF 30-35% with cath showing normal cors.    Diabetes mellitus without complication (HCC)    Obesity (BMI 30-39.9) 10/23/2017    Current Outpatient Medications:    carvedilol  (COREG ) 12.5 MG tablet, TAKE 1&1/2 TABLETS BY MOUTH TWICE DAILY, Disp: 270 tablet, Rfl: 3   desloratadine  (CLARINEX ) 5 MG tablet, Take 1 tablet (5 mg total) by mouth daily as needed (allergies)., Disp: 90 tablet, Rfl: 3   diclofenac  Sodium (VOLTAREN ) 1 % GEL, Voltaren  1 % topical gel, Disp: , Rfl:    Empagliflozin -metFORMIN  HCl ER (SYNJARDY  XR) 25-1000 MG TB24, Take 1 tablet by mouth daily. To replace Trijardy , Disp: 90 tablet, Rfl: 3   fluticasone  (FLONASE ) 50 MCG/ACT nasal spray, Place 2 sprays into both nostrils daily., Disp: 16 g, Rfl: 6   furosemide  (LASIX ) 20 MG tablet, TAKE ONE TABLET BY MOUTH DAILY AS NEEDED, Disp: 90 tablet, Rfl: 2   meclizine  (ANTIVERT ) 12.5 MG tablet, TAKE 1-2 TABLETS BY MOUTH 3 TIMES DAILY AS NEEDED FOR DIZZINESS, Disp: 30 tablet, Rfl: 1   metFORMIN  (GLUCOPHAGE -XR) 500  MG 24 hr tablet, Take 1-2 tablets (500-1,000 mg total) by mouth every evening. With dinner, Disp: 60 tablet, Rfl: 3   pantoprazole  (PROTONIX ) 40 MG tablet, Take 1 tablet (40 mg total) by mouth 2 (two) times daily before a meal., Disp: 180 tablet, Rfl: 3   potassium chloride  SA (KLOR-CON  M) 20 MEQ tablet, TAKE TWO TABLETS BY MOUTH DAILY, Disp: 180 tablet, Rfl: 1   pravastatin  (PRAVACHOL ) 10 MG tablet, TAKE ONE (1) TABLET EACH DAY, Disp: 90 tablet, Rfl: 3   sacubitril -valsartan  (ENTRESTO ) 24-26 MG, Take 1 tablet by mouth 2 (two) times daily., Disp: 60 tablet, Rfl: 6   Semaglutide  (RYBELSUS ) 14 MG TABS, Take 1 tablet (14 mg total) by mouth daily. Start mont #2, Disp: 90 tablet, Rfl: 3   tamsulosin  (FLOMAX ) 0.4 MG CAPS capsule, TAKE ONE CAPSULE BY MOUTH DAILY, Disp: 30 capsule, Rfl: 0   Vitamin D , Ergocalciferol , (DRISDOL ) 1.25 MG (50000 UNIT) CAPS capsule, Take 1 capsule (50,000 Units total) by mouth every 7 (seven) days., Disp: 12 capsule, Rfl: 3 Social History   Socioeconomic History   Marital status: Single    Spouse name: Not on file   Number of children: Not on file   Years of education: Not on file   Highest education level: 12th grade  Occupational History   Occupation: Disabled  Tobacco Use   Smoking status: Never   Smokeless tobacco: Never  Vaping  Use   Vaping status: Never Used  Substance and Sexual Activity   Alcohol use: No   Drug use: No   Sexual activity: Not Currently  Other Topics Concern   Not on file  Social History Narrative   Autistic   Lives with Mother   Social Drivers of Health   Financial Resource Strain: Low Risk  (03/05/2023)   Overall Financial Resource Strain (CARDIA)    Difficulty of Paying Living Expenses: Not hard at all  Food Insecurity: No Food Insecurity (03/05/2023)   Hunger Vital Sign    Worried About Running Out of Food in the Last Year: Never true    Ran Out of Food in the Last Year: Never true  Transportation Needs: No Transportation Needs  (03/05/2023)   PRAPARE - Administrator, Civil Service (Medical): No    Lack of Transportation (Non-Medical): No  Physical Activity: Insufficiently Active (03/05/2023)   Exercise Vital Sign    Days of Exercise per Week: 3 days    Minutes of Exercise per Session: 30 min  Stress: No Stress Concern Present (03/05/2023)   [employer] of Occupational Health - Occupational Stress Questionnaire    Feeling of Stress : Not at all  Social Connections: Moderately Isolated (03/05/2023)   Social Connection and Isolation Panel [NHANES]    Frequency of Communication with Friends and Family: More than three times a week    Frequency of Social Gatherings with Friends and Family: Three times a week    Attends Religious Services: 1 to 4 times per year    Active Member of Clubs or Organizations: No    Attends Banker Meetings: Never    Marital Status: Never married  Intimate Partner Violence: Not At Risk (03/05/2023)   Humiliation, Afraid, Rape, and Kick questionnaire    Fear of Current or Ex-Partner: No    Emotionally Abused: No    Physically Abused: No    Sexually Abused: No   Family History  Problem Relation Age of Onset   Diabetes Mother    Diabetes Father    Bladder Cancer Father    Colon cancer Neg Hx    Esophageal cancer Neg Hx    Liver cancer Neg Hx    Stomach cancer Neg Hx     Objective: Office vital signs reviewed. There were no vitals taken for this visit.  Physical Examination:  General: Awake, alert, *** nourished, No acute distress HEENT: Normal    Neck: No masses palpated. No lymphadenopathy    Ears: Tympanic membranes intact, normal light reflex, no erythema, no bulging    Eyes: PERRLA, extraocular membranes intact, sclera ***    Nose: nasal turbinates moist, *** nasal discharge    Throat: moist mucus membranes, no erythema, *** tonsillar exudate.  Airway is patent Cardio: regular rate and rhythm, S1S2 heard, no murmurs appreciated Pulm:  clear to auscultation bilaterally, no wheezes, rhonchi or rales; normal work of breathing on room air GI: soft, non-tender, non-distended, bowel sounds present x4, no hepatomegaly, no splenomegaly, no masses GU: external vaginal tissue ***, cervix ***, *** punctate lesions on cervix appreciated, *** discharge from cervical os, *** bleeding, *** cervical motion tenderness, *** abdominal/ adnexal masses Extremities: warm, well perfused, No edema, cyanosis or clubbing; +*** pulses bilaterally MSK: *** gait and *** station Skin: dry; intact; no rashes or lesions Neuro: *** Strength and light touch sensation grossly intact, *** DTRs ***/4  Diabetic Foot Exam - Simple   No data filed  Assessment/ Plan: 53 y.o. male   Diabetes mellitus treated with oral medication (HCC)  Hypertension associated with type 2 diabetes mellitus (HCC)  Hyperlipidemia associated with type 2 diabetes mellitus (HCC)  Chronic systolic heart failure (HCC)  Non-ischemic cardiomyopathy (HCC)   ***  Lonzie Simmer [name], DO [facility] [phone]

## 2023-08-29 ENCOUNTER — Encounter: Payer: Self-pay | Admitting: Family Medicine

## 2023-08-30 ENCOUNTER — Encounter: Payer: Self-pay | Admitting: Urology

## 2023-08-30 ENCOUNTER — Ambulatory Visit (INDEPENDENT_AMBULATORY_CARE_PROVIDER_SITE_OTHER): Admitting: Urology

## 2023-08-30 VITALS — BP 121/80 | HR 81

## 2023-08-30 DIAGNOSIS — R351 Nocturia: Secondary | ICD-10-CM | POA: Diagnosis not present

## 2023-08-30 DIAGNOSIS — N3941 Urge incontinence: Secondary | ICD-10-CM

## 2023-08-30 DIAGNOSIS — R4189 Other symptoms and signs involving cognitive functions and awareness: Secondary | ICD-10-CM

## 2023-08-30 DIAGNOSIS — R350 Frequency of micturition: Secondary | ICD-10-CM

## 2023-08-30 DIAGNOSIS — R3129 Other microscopic hematuria: Secondary | ICD-10-CM

## 2023-08-30 DIAGNOSIS — R399 Unspecified symptoms and signs involving the genitourinary system: Secondary | ICD-10-CM | POA: Diagnosis not present

## 2023-08-30 DIAGNOSIS — F840 Autistic disorder: Secondary | ICD-10-CM

## 2023-08-30 DIAGNOSIS — N401 Enlarged prostate with lower urinary tract symptoms: Secondary | ICD-10-CM | POA: Diagnosis not present

## 2023-08-30 DIAGNOSIS — R3915 Urgency of urination: Secondary | ICD-10-CM

## 2023-08-30 LAB — BLADDER SCAN AMB NON-IMAGING: Scan Result: 60

## 2023-08-30 LAB — MICROSCOPIC EXAMINATION
Bacteria, UA: NONE SEEN
RBC, Urine: >30 /HPF — AB (ref 0–2)

## 2023-08-30 LAB — URINALYSIS, ROUTINE W REFLEX MICROSCOPIC
Bilirubin, UA: NEGATIVE
Ketones, UA: NEGATIVE
Leukocytes,UA: NEGATIVE
Nitrite, UA: NEGATIVE
Specific Gravity, UA: 1.02 (ref 1.005–1.030)
Urobilinogen, Ur: 0.2 mg/dL (ref 0.2–1.0)
pH, UA: 6 (ref 5.0–7.5)

## 2023-08-30 MED ORDER — GEMTESA 75 MG PO TABS: 1.0000 | ORAL_TABLET | Freq: Every day | ORAL | 2 refills | Status: DC

## 2023-08-30 MED ORDER — TAMSULOSIN HCL 0.4 MG PO CAPS: 0.4000 mg | ORAL_CAPSULE | Freq: Two times a day (BID) | ORAL | 5 refills | Status: AC

## 2023-08-30 NOTE — Progress Notes (Signed)
 Name: Michael Ramos DOB: 06-Aug-1970 MRN: [id number]  History of Present Illness: Mr. Bartles is a 53 y.o. male who presents today as a new patient at [facility]. All available relevant medical records have been reviewed. He lives at home with his mother and is accompanied today by his sister Amalia Badder, who assists with providing history due to patient's autism with cognitive impairment. GU History includes: 1. BPH with LUTS (urgency, frequency, hesitancy). - Taking Flomax  (Tamsulosin ) 0.4 mg daily. - Denies family history of prostate cancer. - 08/07/2023: CT abdomen/pelvis w/ contrast showed no acute GU findings - no GU stones, masses, or hydronephrosis; bladder and prostate unremarkable.  PSA values: - 10/31/2019: 2.1 - 12/21/2022: 0.9  He / his sister report that Mr. Cass has urinary urgency, frequency, urge incontinence, dribbling weak urinary stream, straining to void ("grunts" while voiding). Leaks urine 1-2 times per day requiring change of underwear. Patient denies dysuria or gross hematuria.  Denies significant caffeine intake (one large soda every other day; no coffee or tea).  He denies history of kidney stones.  He denies history of recent or recurrent UTI.  He denies constipation or straining to defecate.   Medications: Current Outpatient Medications  Medication Sig Dispense Refill   carvedilol  (COREG ) 12.5 MG tablet TAKE 1&1/2 TABLETS BY MOUTH TWICE DAILY 270 tablet 3   desloratadine  (CLARINEX ) 5 MG tablet Take 1 tablet (5 mg total) by mouth daily as needed (allergies). 90 tablet 3   diclofenac  Sodium (VOLTAREN ) 1 % GEL Voltaren  1 % topical gel     Empagliflozin -metFORMIN  HCl ER (SYNJARDY  XR) 25-1000 MG TB24 Take 1 tablet by mouth daily. To replace Trijardy  90 tablet 3   fluticasone  (FLONASE ) 50 MCG/ACT nasal spray Place 2 sprays into both nostrils daily. 16 g 6   furosemide  (LASIX ) 20 MG tablet TAKE ONE TABLET BY MOUTH DAILY AS NEEDED 90 tablet 2    meclizine  (ANTIVERT ) 12.5 MG tablet TAKE 1-2 TABLETS BY MOUTH 3 TIMES DAILY AS NEEDED FOR DIZZINESS 30 tablet 1   metFORMIN  (GLUCOPHAGE -XR) 500 MG 24 hr tablet Take 1-2 tablets (500-1,000 mg total) by mouth every evening. With dinner 60 tablet 3   pantoprazole  (PROTONIX ) 40 MG tablet Take 1 tablet (40 mg total) by mouth 2 (two) times daily before a meal. 180 tablet 3   potassium chloride  SA (KLOR-CON  M) 20 MEQ tablet TAKE TWO TABLETS BY MOUTH DAILY 180 tablet 1   pravastatin  (PRAVACHOL ) 10 MG tablet TAKE ONE (1) TABLET EACH DAY 90 tablet 3   sacubitril -valsartan  (ENTRESTO ) 24-26 MG Take 1 tablet by mouth 2 (two) times daily. 60 tablet 6   Semaglutide  (RYBELSUS ) 14 MG TABS Take 1 tablet (14 mg total) by mouth daily. Start mont #2 90 tablet 3   Vibegron (GEMTESA) 75 MG TABS Take 1 tablet (75 mg total) by mouth daily. 30 tablet 2   tamsulosin  (FLOMAX ) 0.4 MG CAPS capsule Take 1 capsule (0.4 mg total) by mouth in the morning and at bedtime. 60 capsule 5   Vitamin D , Ergocalciferol , (DRISDOL ) 1.25 MG (50000 UNIT) CAPS capsule Take 1 capsule (50,000 Units total) by mouth every 7 (seven) days. (Patient not taking: Reported on 08/30/2023) 12 capsule 3   No current facility-administered medications for this visit.    Allergies: Allergies  Allergen Reactions   Semaglutide  Other (See Comments)    DIZZINESS with Rybelsus     Past Medical History:  Diagnosis Date   Allergy    Autism    CHF (congestive  heart failure) (HCC)    a. EF 20-25% by echo in 08/2016 b. repeat echo in 02/2017 showing EF 30-35% with cath showing normal cors.    Diabetes mellitus without complication (HCC)    Obesity (BMI 30-39.9) 10/23/2017   Past Surgical History:  Procedure Laterality Date   RIGHT/LEFT HEART CATH AND CORONARY ANGIOGRAPHY N/A 03/06/2017   Procedure: RIGHT/LEFT HEART CATH AND CORONARY ANGIOGRAPHY;  Surgeon: Odie Benne, MD;  Location: [facility];  Service: Cardiovascular;  Laterality:  N/A;   Family History  Problem Relation Age of Onset   Diabetes Mother    Diabetes Father    Bladder Cancer Father    Colon cancer Neg Hx    Esophageal cancer Neg Hx    Liver cancer Neg Hx    Stomach cancer Neg Hx    Social History   Socioeconomic History   Marital status: Single    Spouse name: Not on file   Number of children: Not on file   Years of education: Not on file   Highest education level: 12th grade  Occupational History   Occupation: Disabled  Tobacco Use   Smoking status: Never   Smokeless tobacco: Never  Vaping Use   Vaping status: Never Used  Substance and Sexual Activity   Alcohol use: No   Drug use: No   Sexual activity: Not Currently  Other Topics Concern   Not on file  Social History Narrative   Autistic   Lives with Mother   Social Drivers of Health   Financial Resource Strain: Low Risk  (03/05/2023)   Overall Financial Resource Strain (CARDIA)    Difficulty of Paying Living Expenses: Not hard at all  Food Insecurity: No Food Insecurity (03/05/2023)   Hunger Vital Sign    Worried About Running Out of Food in the Last Year: Never true    Ran Out of Food in the Last Year: Never true  Transportation Needs: No Transportation Needs (03/05/2023)   PRAPARE - Administrator, Civil Service (Medical): No    Lack of Transportation (Non-Medical): No  Physical Activity: Insufficiently Active (03/05/2023)   Exercise Vital Sign    Days of Exercise per Week: 3 days    Minutes of Exercise per Session: 30 min  Stress: No Stress Concern Present (03/05/2023)   [employer] of Occupational Health - Occupational Stress Questionnaire    Feeling of Stress : Not at all  Social Connections: Moderately Isolated (03/05/2023)   Social Connection and Isolation Panel [NHANES]    Frequency of Communication with Friends and Family: More than three times a week    Frequency of Social Gatherings with Friends and Family: Three times a week    Attends  Religious Services: 1 to 4 times per year    Active Member of Clubs or Organizations: No    Attends Banker Meetings: Never    Marital Status: Never married  Intimate Partner Violence: Not At Risk (03/05/2023)   Humiliation, Afraid, Rape, and Kick questionnaire    Fear of Current or Ex-Partner: No    Emotionally Abused: No    Physically Abused: No    Sexually Abused: No    SUBJECTIVE  Review of Systems - limited due to patient cognitive impairment Gastrointestinal: As per HPI GU: As per HPI.  OBJECTIVE Vitals:   08/30/23 0942  BP: 121/80  Pulse: 81   There is no height or weight on file to calculate BMI.  Physical Examination Constitutional: No obvious  distress; patient is non-toxic appearing Cardiovascular: No visible lower extremity edema.  Respiratory: The patient does not have audible wheezing/stridor; respirations do not appear labored  Gastrointestinal: Abdomen non-distended Musculoskeletal: Normal ROM of UEs  Skin: No obvious rashes/open sores  Neurologic: CN 2-12 grossly intact Psychiatric: Distracted affect; poor historian Hematologic/Lymphatic/Immunologic: No obvious bruises or sites of spontaneous bleeding  Urine microscopy: >30 RBC/hpf, protein 2+, glucosuria 3+; no WBC or bacteria  PVR: 60 ml  ASSESSMENT Benign prostatic hyperplasia with urinary frequency - Plan: Urinalysis, Routine w reflex microscopic, BLADDER SCAN AMB NON-IMAGING, Vibegron (GEMTESA) 75 MG TABS, tamsulosin  (FLOMAX ) 0.4 MG CAPS capsule  Microscopic hematuria  Cognitive impairment  Autism  Lower urinary tract symptoms - Plan: Vibegron (GEMTESA) 75 MG TABS, tamsulosin  (FLOMAX ) 0.4 MG CAPS capsule  Nocturia - Plan: Vibegron (GEMTESA) 75 MG TABS, tamsulosin  (FLOMAX ) 0.4 MG CAPS capsule  Urinary urgency - Plan: Vibegron (GEMTESA) 75 MG TABS, tamsulosin  (FLOMAX ) 0.4 MG CAPS capsule  Urge incontinence - Plan: Vibegron (GEMTESA) 75 MG TABS, tamsulosin  (FLOMAX ) 0.4 MG CAPS  capsule  For BPH we agreed to increase Flomax  to 0.4 twice daily.  For LUTS / OAB with urinary urgency, frequency, nocturia, urge incontinence: - Neurogenic risk factors: T2DM - Exacerbating factors: diuretic use, glucosuria due to Empagliflozin  (Jardiance ) use  We discussed the following management options in detail including potential benefits, risks, and side effects: Behavioral therapy: Modify fluid intake Minimize / avoid bladder irritants (such as caffeine) Bladder retraining / timed voiding Medication(s): - Not a safe candidate for anticholinergic medications due to risk for side effects based on patient's age, comorbidities, and pre-existing cognitive impairment.  - Beta-3 agonist medications: We discussed potential side effects of beta-3 agonist medications such as urinary retention and (infrequently) elevated blood pressure.   Consider discussing possible alternatives to Jardiance  with prescribing provider. This medication causes excess sugar to be excreted into the urine. That can prompt the kidneys to put out more water to dilute that sugar in the urine and it can also irritate the bladder lining, both of which may contribute to OAB symptoms (urinary frequency, urgency, and urge incontinence).  He decided to proceed with trial of Gemtesa (Vibegron) 75 mg daily.   For persistent microscopic hematuria: Negative CT last month; advised cystoscopy for further evaluation. Patient / his sister verbalized understanding and agreement. All questions were answered.  PLAN Advised the following: Increase Flomax  to 0.4 twice daily. Start Gemtesa (Vibegron) 75 mg daily. Minimize / avoid caffeine intake. Return for 1st available cystoscopy with any urology MD.  Orders Placed This Encounter  Procedures   Urinalysis, Routine w reflex microscopic   BLADDER SCAN AMB NON-IMAGING   Total time spent caring for the patient today was over 45 minutes. This includes time spent on the date of the  visit reviewing the patient's chart before the visit, time spent during the visit, and time spent after the visit on documentation. Over 50% of that time was spent in face-to-face time with this patient for direct counseling. E&M based on time and complexity of medical decision making.  It has been explained that the patient is to follow regularly with their PCP in addition to all other providers involved in their care and to follow instructions provided by these respective offices. Patient advised to contact urology clinic if any urologic-pertaining questions, concerns, new symptoms or problems arise in the interim period.  There are no Patient Instructions on file for this visit.  Electronically signed by:  Lauretta Ponto, MSN, FNP-C,  [facility]P 08/30/2023 10:22 AM

## 2023-09-04 ENCOUNTER — Ambulatory Visit: Admitting: General Surgery

## 2023-09-10 ENCOUNTER — Ambulatory Visit (INDEPENDENT_AMBULATORY_CARE_PROVIDER_SITE_OTHER): Admitting: Neurology

## 2023-09-10 VITALS — BP 121/83 | HR 90 | Ht 75.0 in | Wt 247.0 lb

## 2023-09-10 DIAGNOSIS — I158 Other secondary hypertension: Secondary | ICD-10-CM | POA: Insufficient documentation

## 2023-09-10 DIAGNOSIS — G4733 Obstructive sleep apnea (adult) (pediatric): Secondary | ICD-10-CM | POA: Diagnosis not present

## 2023-09-10 DIAGNOSIS — F840 Autistic disorder: Secondary | ICD-10-CM | POA: Diagnosis not present

## 2023-09-10 DIAGNOSIS — Z7984 Long term (current) use of oral hypoglycemic drugs: Secondary | ICD-10-CM

## 2023-09-10 DIAGNOSIS — G4734 Idiopathic sleep related nonobstructive alveolar hypoventilation: Secondary | ICD-10-CM | POA: Diagnosis not present

## 2023-09-10 DIAGNOSIS — E1121 Type 2 diabetes mellitus with diabetic nephropathy: Secondary | ICD-10-CM | POA: Diagnosis not present

## 2023-09-10 NOTE — Progress Notes (Signed)
 [facility]    Provider:  Neomia Banner, MD  Primary Care Physician:  Eliodoro Guerin, DO [address] [place] [address]     Referring Provider:   Verneda Golder [address] [place],  [place] [address]          Chief Complaint according to patient   Patient presents with:     New Patient (Initial Visit)     when he was in  [facility]  for vomiting  there was questionable concern for apnea. Oxygen  level would alarm. Never had a SS. Hypertension  Typically falls asleep 11p-1am and wakes up 9/10 am and overall will wake up.       HISTORY OF PRESENT ILLNESS:  Michael Ramos is a 53 y.o. male patient who is seen upon Dr Dyanne Glass  referral on 09/10/2023 for follow up after a hospital stay during which the hypoxia alarm was frequently ringing.   Chief concern according to patient :  Michael Ramos is a 53 y.o. male. Seen on 08-07-2023 : ED Visit -   Patient is a 53 year old male who presents to the emergency department with a chief complaint of dizziness, elevated blood pressure, and bout of nausea vomiting last night.  Patient is accompanied by his mother who notes provide the history given his history of autism.   Patient is currently on Augmentin  at this time secondary to sinusitis and ear infection.  Patient has been taking Claritin  as well but has not been taking any other over-the-counter medications.  Patient denies any chest pain, shortness of breath, abdominal pain.  There is been no associated fever or chills.  Dizziness has been intermittent in nature and nothing particular seems to make it better or worse.    The patient was dx with a gallstone and was observed over night, had no surgery . He was very hypertensive. He has autism is obese. He is verbal. He has D. HTN.       I have the pleasure of seeing Michael Ramos 09/10/23 for a possible sleep disorder. He is sleepier and was sleeping when I entered the room , is yawning.      Sleep relevant medical history: Nocturia 1-2,  No Tonsillectomy, obesity, hip bursitis, asymmetric shoulders.    Family medical /sleep history: brother  on CPAP with OSA.  Social history:  patient has  always lived with presents and  sister is taking him  to appointment . His father died  12 years ago of cancer .  Patient is disabled  , no pets.  Family status is single  he has nieces and nephews.  Tobacco use none.   ETOH use none ,  Caffeine intake in form of Coffee( /) Soda( 2-3 a week ) Tea ( /) or energy drinks      Sleep habits are as follows: The patient's dinner time is between 7-8  PM. The patient goes to bed at midnight  and watches TV until then, continues to sleep for 7 hours, wakes for 1-2 bathroom breaks, the first time at 3-4 AM.   The preferred sleep position is variable , with the support of 2 pillows.  Dreams are reportedly infrequent. The patient wakes up spontaneously/ 8-9 AM without an alarm. 9-10  AM is the usual rise time. He  reports not feeling refreshed or restored in AM, with symptoms such as dry mouth, morning headaches, and residual fatigue.  He drinks a  lot.  Naps are taken infrequently.    Review of Systems: Out of a complete 14 system review, the patient complains of only the following symptoms, and all other reviewed systems are negative.:  Fatigue, sleepiness , snoring, fragmented sleep, Tv or smart phone screen  in the bedroom-   RLS, Nocturia    How likely are you to doze in the following situations: 0 = not likely, 1 = slight chance, 2 = moderate chance, 3 = high chance   Sitting and Reading? Watching Television? Sitting inactive in a public place (theater or meeting)? As a passenger in a car for an hour without a break? Lying down in the afternoon when circumstances permit? Sitting and talking to someone? Sitting quietly after lunch without alcohol? In a car, while stopped for a few minutes in traffic?   Total = 16/ 24 points - he is not  driving  FSS endorsed at 60/ 63 points.   Social History   Socioeconomic History   Marital status: Single    Spouse name: Not on file   Number of children: Not on file   Years of education: Not on file   Highest education level: 12th grade  Occupational History   Occupation: Disabled  Tobacco Use   Smoking status: Never   Smokeless tobacco: Never  Vaping Use   Vaping status: Never Used  Substance and Sexual Activity   Alcohol use: No   Drug use: No   Sexual activity: Not Currently  Other Topics Concern   Not on file  Social History Narrative   Autistic   Lives with Mother   Social Drivers of Health   Financial Resource Strain: Low Risk  (03/05/2023)   Overall Financial Resource Strain (CARDIA)    Difficulty of Paying Living Expenses: Not hard at all  Food Insecurity: No Food Insecurity (03/05/2023)   Hunger Vital Sign    Worried About Running Out of Food in the Last Year: Never true    Ran Out of Food in the Last Year: Never true  Transportation Needs: No Transportation Needs (03/05/2023)   PRAPARE - Administrator, Civil Service (Medical): No    Lack of Transportation (Non-Medical): No  Physical Activity: Insufficiently Active (03/05/2023)   Exercise Vital Sign    Days of Exercise per Week: 3 days    Minutes of Exercise per Session: 30 min  Stress: No Stress Concern Present (03/05/2023)   [employer] of Occupational Health - Occupational Stress Questionnaire    Feeling of Stress : Not at all  Social Connections: Moderately Isolated (03/05/2023)   Social Connection and Isolation Panel [NHANES]    Frequency of Communication with Friends and Family: More than three times a week    Frequency of Social Gatherings with Friends and Family: Three times a week    Attends Religious Services: 1 to 4 times per year    Active Member of Clubs or Organizations: No    Attends Banker Meetings: Never    Marital Status: Never married    Family  History  Problem Relation Age of Onset   Diabetes Mother    Diabetes Father    Bladder Cancer Father    Colon cancer Neg Hx    Esophageal cancer Neg Hx    Liver cancer Neg Hx    Stomach cancer Neg Hx     Past Medical History:  Diagnosis Date   Allergy    Autism    CHF (congestive heart failure) (HCC)  a. EF 20-25% by echo in 08/2016 b. repeat echo in 02/2017 showing EF 30-35% with cath showing normal cors.    Diabetes mellitus without complication (HCC)    Obesity (BMI 30-39.9) 10/23/2017    Past Surgical History:  Procedure Laterality Date   RIGHT/LEFT HEART CATH AND CORONARY ANGIOGRAPHY N/A 03/06/2017   Procedure: RIGHT/LEFT HEART CATH AND CORONARY ANGIOGRAPHY;  Surgeon: Odie Benne, MD;  Location: [facility];  Service: Cardiovascular;  Laterality: N/A;     Current Outpatient Medications on File Prior to Visit  Medication Sig Dispense Refill   carvedilol  (COREG ) 12.5 MG tablet TAKE 1&1/2 TABLETS BY MOUTH TWICE DAILY 270 tablet 3   desloratadine  (CLARINEX ) 5 MG tablet Take 1 tablet (5 mg total) by mouth daily as needed (allergies). 90 tablet 3   diclofenac  Sodium (VOLTAREN ) 1 % GEL Voltaren  1 % topical gel     Empagliflozin -metFORMIN  HCl ER (SYNJARDY  XR) 25-1000 MG TB24 Take 1 tablet by mouth daily. To replace Trijardy  90 tablet 3   fluticasone  (FLONASE ) 50 MCG/ACT nasal spray Place 2 sprays into both nostrils daily. 16 g 6   furosemide  (LASIX ) 20 MG tablet TAKE ONE TABLET BY MOUTH DAILY AS NEEDED 90 tablet 2   meclizine  (ANTIVERT ) 12.5 MG tablet TAKE 1-2 TABLETS BY MOUTH 3 TIMES DAILY AS NEEDED FOR DIZZINESS 30 tablet 1   metFORMIN  (GLUCOPHAGE -XR) 500 MG 24 hr tablet Take 1-2 tablets (500-1,000 mg total) by mouth every evening. With dinner 60 tablet 3   pantoprazole  (PROTONIX ) 40 MG tablet Take 1 tablet (40 mg total) by mouth 2 (two) times daily before a meal. 180 tablet 3   potassium chloride  SA (KLOR-CON  M) 20 MEQ tablet TAKE TWO TABLETS BY MOUTH DAILY  180 tablet 1   pravastatin  (PRAVACHOL ) 10 MG tablet TAKE ONE (1) TABLET EACH DAY 90 tablet 3   sacubitril -valsartan  (ENTRESTO ) 24-26 MG Take 1 tablet by mouth 2 (two) times daily. 60 tablet 6   Semaglutide  (RYBELSUS ) 14 MG TABS Take 1 tablet (14 mg total) by mouth daily. Start mont #2 90 tablet 3   tamsulosin  (FLOMAX ) 0.4 MG CAPS capsule Take 1 capsule (0.4 mg total) by mouth in the morning and at bedtime. 60 capsule 5   Vibegron  (GEMTESA ) 75 MG TABS Take 1 tablet (75 mg total) by mouth daily. 30 tablet 2   Vitamin D , Ergocalciferol , (DRISDOL ) 1.25 MG (50000 UNIT) CAPS capsule Take 1 capsule (50,000 Units total) by mouth every 7 (seven) days. 12 capsule 3   No current facility-administered medications on file prior to visit.    Allergies  Allergen Reactions   Semaglutide  Other (See Comments)    DIZZINESS with Rybelsus      DIAGNOSTIC DATA (LABS, IMAGING, TESTING) - I reviewed patient records, labs, notes, testing and imaging myself where available.  Lab Results  Component Value Date   WBC 9.2 08/07/2023   HGB 15.4 08/07/2023   HCT 47.9 08/07/2023   MCV 85.5 08/07/2023   PLT 237 08/07/2023      Component Value Date/Time   NA 135 08/07/2023 1100   NA 137 12/21/2022 1615   K 4.7 08/07/2023 1100   CL 100 08/07/2023 1100   CO2 27 08/07/2023 1100   GLUCOSE 195 (H) 08/07/2023 1100   BUN 29 (H) 08/07/2023 1100   BUN 24 12/21/2022 1615   CREATININE 1.61 (H) 08/07/2023 1100   CALCIUM 9.0 08/07/2023 1100   PROT 6.6 08/07/2023 1100   PROT 6.8 04/05/2022 1107   ALBUMIN 3.0 (  L) 08/07/2023 1100   ALBUMIN 4.4 10/24/2022 1427   AST 14 (L) 08/07/2023 1100   ALT 13 08/07/2023 1100   ALKPHOS 27 (L) 08/07/2023 1100   BILITOT 0.7 08/07/2023 1100   BILITOT 0.5 04/05/2022 1107   GFRNONAA 51 (L) 08/07/2023 1100   GFRAA 55 (L) 05/11/2020 1104   Lab Results  Component Value Date   CHOL 130 04/05/2022   HDL 43 04/05/2022   LDLCALC 58 04/05/2022   TRIG 176 (H) 04/05/2022   CHOLHDL 3.0  04/05/2022   Lab Results  Component Value Date   HGBA1C 8.5 (H) 03/02/2023   No results found for: "VITAMINB12" Lab Results  Component Value Date   TSH 1.450 04/29/2019    PHYSICAL EXAM:  Today's Vitals   09/10/23 1313  BP: 121/83  Pulse: 90  Weight: 247 lb (112 kg)  Height: 6\' 3"  (1.905 m)   Body mass index is 30.87 kg/m.   Wt Readings from Last 3 Encounters:  09/10/23 247 lb (112 kg)  08/07/23 244 lb (110.7 kg)  08/02/23 244 lb (110.7 kg)     Ht Readings from Last 3 Encounters:  09/10/23 6\' 3"  (1.905 m)  08/07/23 6\' 3"  (1.905 m)  08/02/23 6\' 3"  (1.905 m)      General: The patient is awake, alert and appears not in acute distress. The patient is groomed. Head: Normocephalic, atraumatic. Neck is supple.  Mallampati 2,  neck circumference:18 inches .  Nasal airflow  patent.  Retrognathia is not seen.  Dental status: poor , gaps, irregular dental placement and grinding marks.  Cardiovascular:  Regular rate and cardiac rhythm by pulse,  without distended neck veins. Respiratory: Lungs are clear to auscultation.  Skin:  Without evidence of ankle edema, or rash. Trunk: The patient's posture is erect.   NEUROLOGIC EXAM: The patient is awake and alert, oriented to place and time.   Memory subjective described as intact.  Attention span & concentration ability appears normal.  Speech is fluent,  without  dysarthria, dysphonia or aphasia.  Mood and affect are appropriate.   Cranial nerves: no loss of smell or taste reported  Pupils are equal and briskly reactive to light.  He avoids direct eye contact- Funduscopic exam deferred.  Extraocular movements in vertical and horizontal planes were intact and without nystagmus. No Diplopia. Visual fields by finger perimetry are intact. Hearing was intact to soft voice and tuning fork, non lateralizing.   Facial sensation intact to fine touch.  Facial motor strength is symmetric and tongue and uvula move midline.  Neck ROM  : rotation, tilt and flexion extension were normal for age and shoulder shrug was symmetrical.    Motor exam:  Symmetric bulk, tone and ROM.   Normal tone without cog wheeling, symmetric  and strong grip strength .   Sensory:  Fine touch, pinprick and vibration were tested  and  normal.  Proprioception tested in the upper extremities was normal.   Coordination: Rapid alternating movements in the fingers/hands were of normal speed.  The Finger-to-nose maneuver was intact without evidence of ataxia, dysmetria or tremor.   Gait and station: Patient could rise unassisted from a seated position, walked without assistive device.  He appears slightly clumsy, but has reduced  arm swing.  Stance is of normal width/ base and the patient turned with 3 steps.  Toe and heel walk were deferred.  Deep tendon reflexes: in the  upper and lower extremities are symmetric and intact.  Babinski response was deferred  ASSESSMENT AND PLAN 53 y.o. year old male  here with:    1) recent episode on poorly controlled hypertension in the setting of pain, gallbladder stone obstruction.   2) with this [facility] admission , hypoxia was noted during observation.   3) he has risk factors for OSA but his autism will make an in lab study difficult, he has also a family history of sleep apnea and obesity , neck size and  oral  anatomy.  He has lost 60 pounds  over 18 months, he reports , that alone may have reduced  his apnea risk.   He still has DM 2, and CKD and is on Jardiance , which influences the urinary frequency.   Dr Amanda Jungling follows his HTN    Plan :   HST, this can be  a watch pat:  Sister will need to watch the set up and she will return the device in AM.     I plan to follow up either personally or through our NP with this adult autistic man within 3-5 months.  I feel that the patient likely has OSA and we will follow up.   I would like to thank Eliodoro Guerin, DO  for allowing me  to meet with and to take care of this pleasant patient.     After spending a total time of  45  minutes face to face and additional time for physical and neurologic examination, review of laboratory studies,  personal review of imaging studies, reports and results of other testing and review of referral information / records as far as provided in visit,   Electronically signed by: Neomia Banner, MD 09/10/2023 1:58 PM  [facility] Board certified by The [employer] of Sleep Medicine and Diplomate of the [employer] of Sleep Medicine. Board certified In Neurology through the ABPN, Fellow of the [employer] of Neurology.

## 2023-09-27 ENCOUNTER — Ambulatory Visit: Admitting: General Surgery

## 2023-09-27 ENCOUNTER — Encounter: Payer: Self-pay | Admitting: General Surgery

## 2023-09-27 VITALS — BP 120/80 | HR 83 | Temp 97.7°F | Resp 16 | Ht 75.0 in | Wt 250.0 lb

## 2023-09-27 DIAGNOSIS — K8020 Calculus of gallbladder without cholecystitis without obstruction: Secondary | ICD-10-CM | POA: Diagnosis not present

## 2023-09-27 NOTE — Patient Instructions (Signed)
 Call with any symptoms or issues.

## 2023-09-27 NOTE — Progress Notes (Unsigned)
 [facility] History and Physical  Reason for Referral:*** Referring Physician: ***  Chief Complaint   New Patient (Initial Visit)     Michael Ramos is a 53 y.o. male.  HPI:   Discussed the use of AI scribe software for clinical note transcription with the patient, who gave verbal consent to proceed.  History of Present Illness      ***.  The *** started *** and has had a duration of ***.  It is associated with ***.  The *** is improved with ***, and is made worse with ***.    Quality*** Context***  Past Medical History:  Diagnosis Date  . Allergy   . Autism   . CHF (congestive heart failure) (HCC)    a. EF 20-25% by echo in 08/2016 b. repeat echo in 02/2017 showing EF 30-35% with cath showing normal cors.   . Diabetes mellitus without complication (HCC)   . Obesity (BMI 30-39.9) 10/23/2017    Past Surgical History:  Procedure Laterality Date  . RIGHT/LEFT HEART CATH AND CORONARY ANGIOGRAPHY N/A 03/06/2017   Procedure: RIGHT/LEFT HEART CATH AND CORONARY ANGIOGRAPHY;  Surgeon: Verlin Lonni BIRCH, MD;  Location: [facility];  Service: Cardiovascular;  Laterality: N/A;    Family History  Problem Relation Age of Onset  . Diabetes Mother   . Diabetes Father   . Bladder Cancer Father   . Colon cancer Neg Hx   . Esophageal cancer Neg Hx   . Liver cancer Neg Hx   . Stomach cancer Neg Hx     Social History   Tobacco Use  . Smoking status: Never  . Smokeless tobacco: Never  Vaping Use  . Vaping status: Never Used  Substance Use Topics  . Alcohol use: No  . Drug use: No    Medications: {medication reviewed/display:3041432} Allergies as of 09/27/2023       Reactions   Semaglutide  Other (See Comments)   DIZZINESS with Rybelsus         Medication List        Accurate as of September 27, 2023 12:58 PM. If you have any questions, ask your nurse or doctor.          carvedilol  12.5 MG tablet Commonly known as: COREG  TAKE  1&1/2 TABLETS BY MOUTH TWICE DAILY   desloratadine  5 MG tablet Commonly known as: Clarinex  Take 1 tablet (5 mg total) by mouth daily as needed (allergies).   diclofenac  Sodium 1 % Gel Commonly known as: VOLTAREN  Voltaren  1 % topical gel   fluticasone  50 MCG/ACT nasal spray Commonly known as: FLONASE  Place 2 sprays into both nostrils daily.   furosemide  20 MG tablet Commonly known as: LASIX  TAKE ONE TABLET BY MOUTH DAILY AS NEEDED   Gemtesa  75 MG Tabs Generic drug: Vibegron  Take 1 tablet (75 mg total) by mouth daily.   meclizine  12.5 MG tablet Commonly known as: ANTIVERT  TAKE 1-2 TABLETS BY MOUTH 3 TIMES DAILY AS NEEDED FOR DIZZINESS   metFORMIN  500 MG 24 hr tablet Commonly known as: GLUCOPHAGE -XR Take 1-2 tablets (500-1,000 mg total) by mouth every evening. With dinner   pantoprazole  40 MG tablet Commonly known as: PROTONIX  Take 1 tablet (40 mg total) by mouth 2 (two) times daily before a meal.   potassium chloride  SA 20 MEQ tablet Commonly known as: KLOR-CON  M TAKE TWO TABLETS BY MOUTH DAILY   pravastatin  10 MG tablet Commonly known as: PRAVACHOL  TAKE ONE (1) TABLET EACH DAY   Rybelsus  14 MG Tabs  Generic drug: Semaglutide  Take 1 tablet (14 mg total) by mouth daily. Start mont #2   sacubitril -valsartan  24-26 MG Commonly known as: ENTRESTO  Take 1 tablet by mouth 2 (two) times daily.   Synjardy  XR 25-1000 MG Tb24 Generic drug: Empagliflozin -metFORMIN  HCl ER Take 1 tablet by mouth daily. To replace Trijardy    tamsulosin  0.4 MG Caps capsule Commonly known as: FLOMAX  Take 1 capsule (0.4 mg total) by mouth in the morning and at bedtime.   Vitamin D  (Ergocalciferol ) 1.25 MG (50000 UNIT) Caps capsule Commonly known as: DRISDOL  Take 1 capsule (50,000 Units total) by mouth every 7 (seven) days.         ROS:  {Review of Systems:30496}  Blood pressure 120/80, pulse 83, temperature 97.7 F (36.5 C), temperature source Oral, resp. rate 16, height 6' 3 (1.905  m), weight 250 lb (113.4 kg), SpO2 94%. Physical Exam Physical Exam   Results: CLINICAL DATA:  Abdominal pain, acute, nonlocalized. Nausea/vomiting and dizziness.   EXAM: CT ABDOMEN AND PELVIS WITH CONTRAST   TECHNIQUE: Multidetector CT imaging of the abdomen and pelvis was performed using the standard protocol following bolus administration of intravenous contrast.   RADIATION DOSE REDUCTION: This exam was performed according to the departmental dose-optimization program which includes automated exposure control, adjustment of the mA and/or kV according to patient size and/or use of iterative reconstruction technique.   CONTRAST:  OMNIPAQUE  IOHEXOL  300 MG/ML  SOLN   COMPARISON:  None Available.   FINDINGS: Lower chest: The lung bases are clear. No pleural effusion. The heart is normal in size. No pericardial effusion.   Hepatobiliary: The liver is normal in size. Non-cirrhotic configuration. No suspicious mass. These is mild diffuse hepatic steatosis. No intrahepatic or extrahepatic bile duct dilation. Small volume gallstones and sludge noted without imaging signs of acute cholecystitis. Normal gallbladder wall thickness. No pericholecystic inflammatory changes.   Pancreas: Unremarkable. No pancreatic ductal dilatation or surrounding inflammatory changes.   Spleen: Within normal limits. No focal lesion.   Adrenals/Urinary Tract: Adrenal glands are unremarkable. No suspicious renal mass. No hydronephrosis. No renal or ureteric calculi. Unremarkable urinary bladder.   Stomach/Bowel: No disproportionate dilation of the small or large bowel loops. No evidence of abnormal bowel wall thickening or inflammatory changes. The appendix is unremarkable.   Vascular/Lymphatic: No ascites or pneumoperitoneum. No abdominal or pelvic lymphadenopathy, by size criteria. No aneurysmal dilation of the major abdominal arteries.   Reproductive: Normal size prostate. Symmetric  seminal vesicles.   Other: There are small fat containing umbilical and left inguinal hernias. The soft tissues and abdominal wall are otherwise unremarkable.   Musculoskeletal: No suspicious osseous lesions. There are mild - moderate multilevel degenerative changes in the visualized spine.   IMPRESSION: 1. No acute inflammatory process identified within the abdomen or pelvis. 2. Cholelithiasis without acute cholecystitis. 3. Multiple other nonacute observations, as described above.     Electronically Signed   By: Ree Molt M.D.   On: 08/07/2023 12:53    Assessment and Plan: Assessment and Plan Assessment & Plan      Michael Ramos is a 53 y.o. male with *** -*** -*** -Follow up ***  All questions were answered to the satisfaction of the patient and family***.  The risk and benefits of *** were discussed including but not limited to ***.  After careful consideration, BRIA PORTALES has decided to ***.    Manuelita JAYSON Pander 09/27/2023, 12:58 PM

## 2023-10-03 ENCOUNTER — Other Ambulatory Visit: Payer: Self-pay | Admitting: Family Medicine

## 2023-10-03 DIAGNOSIS — K219 Gastro-esophageal reflux disease without esophagitis: Secondary | ICD-10-CM

## 2023-10-10 ENCOUNTER — Ambulatory Visit: Admitting: Urology

## 2023-10-10 VITALS — BP 124/84 | HR 81

## 2023-10-10 DIAGNOSIS — R351 Nocturia: Secondary | ICD-10-CM | POA: Diagnosis not present

## 2023-10-10 DIAGNOSIS — N410 Acute prostatitis: Secondary | ICD-10-CM

## 2023-10-10 DIAGNOSIS — N401 Enlarged prostate with lower urinary tract symptoms: Secondary | ICD-10-CM

## 2023-10-10 DIAGNOSIS — R350 Frequency of micturition: Secondary | ICD-10-CM

## 2023-10-10 DIAGNOSIS — N411 Chronic prostatitis: Secondary | ICD-10-CM

## 2023-10-10 LAB — URINALYSIS, ROUTINE W REFLEX MICROSCOPIC
Bilirubin, UA: NEGATIVE
Ketones, UA: NEGATIVE
Leukocytes,UA: NEGATIVE
Nitrite, UA: NEGATIVE
Specific Gravity, UA: 1.02 (ref 1.005–1.030)
Urobilinogen, Ur: 0.2 mg/dL (ref 0.2–1.0)
pH, UA: 6 (ref 5.0–7.5)

## 2023-10-10 LAB — MICROSCOPIC EXAMINATION: Bacteria, UA: NONE SEEN

## 2023-10-10 MED ORDER — CIPROFLOXACIN HCL 500 MG PO TABS: 500.0000 mg | ORAL_TABLET | Freq: Once | ORAL | Status: DC

## 2023-10-10 MED ORDER — DOXYCYCLINE HYCLATE 100 MG PO CAPS: 100.0000 mg | ORAL_CAPSULE | Freq: Two times a day (BID) | ORAL | 0 refills | Status: DC

## 2023-10-10 NOTE — Progress Notes (Unsigned)
 10/10/2023 1:27 PM   Michael Ramos 26-Jan-1971 [id number]  Referring provider: Jolinda Norene HERO, DO [address] [place],  [place] [address]  Difficulty urinating    HPI: Michael Ramos is a 52yo here for followup for BPH. UA today shows 11-30 RBCs/hpf. He drinks multiple sodas daily. He is on jardiance . Over the past 5-6 months his stream is weaker.,    PMH: Past Medical History:  Diagnosis Date   Allergy    Autism    CHF (congestive heart failure) (HCC)    a. EF 20-25% by echo in 08/2016 b. repeat echo in 02/2017 showing EF 30-35% with cath showing normal cors.    Diabetes mellitus without complication (HCC)    Obesity (BMI 30-39.9) 10/23/2017    Surgical History: Past Surgical History:  Procedure Laterality Date   RIGHT/LEFT HEART CATH AND CORONARY ANGIOGRAPHY N/A 03/06/2017   Procedure: RIGHT/LEFT HEART CATH AND CORONARY ANGIOGRAPHY;  Surgeon: Michael Lonni BIRCH, MD;  Location: [facility];  Service: Cardiovascular;  Laterality: N/A;    Home Medications:  Allergies as of 10/10/2023       Reactions   Semaglutide  Other (See Comments)   DIZZINESS with Rybelsus         Medication List        Accurate as of October 10, 2023  1:27 PM. If you have any questions, ask your nurse or doctor.          carvedilol  12.5 MG tablet Commonly known as: COREG  TAKE 1&1/2 TABLETS BY MOUTH TWICE DAILY   desloratadine  5 MG tablet Commonly known as: Clarinex  Take 1 tablet (5 mg total) by mouth daily as needed (allergies).   diclofenac  Sodium 1 % Gel Commonly known as: VOLTAREN  Voltaren  1 % topical gel   fluticasone  50 MCG/ACT nasal spray Commonly known as: FLONASE  Place 2 sprays into both nostrils daily.   furosemide  20 MG tablet Commonly known as: LASIX  TAKE ONE TABLET BY MOUTH DAILY AS NEEDED   Gemtesa  75 MG Tabs Generic drug: Vibegron  Take 1 tablet (75 mg total) by mouth daily.   meclizine  12.5 MG tablet Commonly known as: ANTIVERT  TAKE 1-2 TABLETS BY  MOUTH 3 TIMES DAILY AS NEEDED FOR DIZZINESS   metFORMIN  500 MG 24 hr tablet Commonly known as: GLUCOPHAGE -XR Take 1-2 tablets (500-1,000 mg total) by mouth every evening. With dinner   pantoprazole  40 MG tablet Commonly known as: PROTONIX  TAKE 1 TABLET BY MOUTH TWICE DAILY BEFORE MEALS   potassium chloride  SA 20 MEQ tablet Commonly known as: KLOR-CON  M TAKE TWO TABLETS BY MOUTH DAILY   pravastatin  10 MG tablet Commonly known as: PRAVACHOL  TAKE ONE (1) TABLET EACH DAY   Rybelsus  14 MG Tabs Generic drug: Semaglutide  Take 1 tablet (14 mg total) by mouth daily. Start mont #2   sacubitril -valsartan  24-26 MG Commonly known as: ENTRESTO  Take 1 tablet by mouth 2 (two) times daily.   Synjardy  XR 25-1000 MG Tb24 Generic drug: Empagliflozin -metFORMIN  HCl ER Take 1 tablet by mouth daily. To replace Trijardy    tamsulosin  0.4 MG Caps capsule Commonly known as: FLOMAX  Take 1 capsule (0.4 mg total) by mouth in the morning and at bedtime.   Vitamin D  (Ergocalciferol ) 1.25 MG (50000 UNIT) Caps capsule Commonly known as: DRISDOL  Take 1 capsule (50,000 Units total) by mouth every 7 (seven) days.        Allergies:  Allergies  Allergen Reactions   Semaglutide  Other (See Comments)    DIZZINESS with Rybelsus     Family History:  Family History  Problem Relation Age of Onset   Diabetes Mother    Diabetes Father    Bladder Cancer Father    Colon cancer Neg Hx    Esophageal cancer Neg Hx    Liver cancer Neg Hx    Stomach cancer Neg Hx     Social History:  reports that he has never smoked. He has never used smokeless tobacco. He reports that he does not drink alcohol and does not use drugs.  ROS: All other review of systems were reviewed and are negative except what is noted above in HPI  Physical Exam: BP 124/84   Pulse 81   Constitutional:  Alert and oriented, No acute distress. HEENT: [place] AT, moist mucus membranes.  Trachea midline, no masses. Cardiovascular: No clubbing,  cyanosis, or edema. Respiratory: Normal respiratory effort, no increased work of breathing. GI: Abdomen is soft, nontender, nondistended, no abdominal masses GU: No CVA tenderness.  Lymph: No cervical or inguinal lymphadenopathy. Skin: No rashes, bruises or suspicious lesions. Neurologic: Grossly intact, no focal deficits, moving all 4 extremities. Psychiatric: Normal mood and affect.  Laboratory Data: Lab Results  Component Value Date   WBC 9.2 08/07/2023   HGB 15.4 08/07/2023   HCT 47.9 08/07/2023   MCV 85.5 08/07/2023   PLT 237 08/07/2023    Lab Results  Component Value Date   CREATININE 1.61 (H) 08/07/2023    No results found for: PSA  No results found for: TESTOSTERONE  Lab Results  Component Value Date   HGBA1C 8.5 (H) 03/02/2023    Urinalysis    Component Value Date/Time   COLORURINE YELLOW 09/09/2018 0420   APPEARANCEUR Clear 08/30/2023 0943   LABSPEC 1.018 09/09/2018 0420   PHURINE 5.0 09/09/2018 0420   GLUCOSEU 3+ (A) 08/30/2023 0943   HGBUR LARGE (A) 09/09/2018 0420   BILIRUBINUR Negative 08/30/2023 0943   KETONESUR NEGATIVE 09/09/2018 0420   PROTEINUR 2+ (A) 08/30/2023 0943   PROTEINUR 30 (A) 09/09/2018 0420   NITRITE Negative 08/30/2023 0943   NITRITE NEGATIVE 09/09/2018 0420   LEUKOCYTESUR Negative 08/30/2023 0943   LEUKOCYTESUR TRACE (A) 09/09/2018 0420    Lab Results  Component Value Date   LABMICR See below: 08/30/2023   WBCUA 0-5 08/30/2023   LABEPIT 0-10 08/30/2023   BACTERIA None seen 08/30/2023    Pertinent Imaging: *** No results found for this or any previous visit.  No results found for this or any previous visit.  No results found for this or any previous visit.  No results found for this or any previous visit.  No results found for this or any previous visit.  No results found for this or any previous visit.  No results found for this or any previous visit.  No results found for this or any previous  visit.   Assessment & Plan:    1. Benign prostatic hyperplasia with urinary frequency (Primary) *** - Cystoscopy (Bedside) - ciprofloxacin (CIPRO) tablet 500 mg - Urinalysis, Routine w reflex microscopic  2. Nocturia ***   No follow-ups on file.  Belvie Clara, MD  [facility] [place]

## 2023-11-01 ENCOUNTER — Other Ambulatory Visit: Payer: Self-pay | Admitting: Urology

## 2023-11-01 DIAGNOSIS — N401 Enlarged prostate with lower urinary tract symptoms: Secondary | ICD-10-CM

## 2023-11-01 DIAGNOSIS — N3941 Urge incontinence: Secondary | ICD-10-CM

## 2023-11-01 DIAGNOSIS — R399 Unspecified symptoms and signs involving the genitourinary system: Secondary | ICD-10-CM

## 2023-11-01 DIAGNOSIS — R3915 Urgency of urination: Secondary | ICD-10-CM

## 2023-11-01 DIAGNOSIS — R351 Nocturia: Secondary | ICD-10-CM

## 2023-11-09 ENCOUNTER — Encounter: Payer: Self-pay | Admitting: Family Medicine

## 2023-11-09 ENCOUNTER — Other Ambulatory Visit: Payer: Self-pay | Admitting: Family Medicine

## 2023-11-09 ENCOUNTER — Ambulatory Visit (INDEPENDENT_AMBULATORY_CARE_PROVIDER_SITE_OTHER): Admitting: Family Medicine

## 2023-11-09 VITALS — BP 126/85 | HR 75 | Temp 96.3°F | Ht 75.0 in | Wt 243.0 lb

## 2023-11-09 DIAGNOSIS — Z7984 Long term (current) use of oral hypoglycemic drugs: Secondary | ICD-10-CM

## 2023-11-09 DIAGNOSIS — E119 Type 2 diabetes mellitus without complications: Secondary | ICD-10-CM

## 2023-11-09 DIAGNOSIS — I152 Hypertension secondary to endocrine disorders: Secondary | ICD-10-CM

## 2023-11-09 DIAGNOSIS — E1169 Type 2 diabetes mellitus with other specified complication: Secondary | ICD-10-CM | POA: Diagnosis not present

## 2023-11-09 DIAGNOSIS — E1159 Type 2 diabetes mellitus with other circulatory complications: Secondary | ICD-10-CM | POA: Diagnosis not present

## 2023-11-09 DIAGNOSIS — E785 Hyperlipidemia, unspecified: Secondary | ICD-10-CM | POA: Diagnosis not present

## 2023-11-09 LAB — BAYER DCA HB A1C WAIVED: HB A1C (BAYER DCA - WAIVED): 7.9 % — ABNORMAL HIGH (ref 4.8–5.6)

## 2023-11-09 NOTE — Progress Notes (Signed)
 Subjective: CC:DM PCP: Jolinda Norene HERO, DO YEP:[id number] L Ing is a 53 y.o. male presenting to clinic today for:  1. Type 2 Diabetes with hypertension, hyperlipidemia:  Patient is brought to the office by his Sister Devere.  His mother manages his medications primarily.  He is prescribed Synjardy  XR 25-1000 mg, metformin  extended release 500 to 1000 mg q. supper and Rybelsus  14 mg daily.  However, after further discussion it appears that he is only been getting Synjardy  extended release and not the other 2 medications.  He has also been out of his testing supplies.  He typically checks his blood sugar 3 times daily.  Not sure what his blood sugars have been running.  He reports no chest pain, shortness of breath or edema.  Compliant with heart failure medications and cholesterol medicine as prescribed  His sister goes on to tell me that there is concerned that his Jardiance  portion of his sugar medications may be affecting his prostate and he is currently being worked up by urology for this.  Recently placed on Flomax  and Gemtesa .  He is only been on this regimen for a couple of weeks and they will reconvene to determine if he needs cystoscopy or not  Diabetes Health Maintenance Due  Topic Date Due   HEMOGLOBIN A1C  08/30/2023   FOOT EXAM  10/24/2023   OPHTHALMOLOGY EXAM  11/23/2023    Last A1c:  Lab Results  Component Value Date   HGBA1C 8.5 (H) 03/02/2023    ROS: Per HPI  Allergies  Allergen Reactions   Semaglutide  Other (See Comments)    DIZZINESS with Rybelsus    Past Medical History:  Diagnosis Date   Allergy    Autism    CHF (congestive heart failure) (HCC)    a. EF 20-25% by echo in 08/2016 b. repeat echo in 02/2017 showing EF 30-35% with cath showing normal cors.    Diabetes mellitus without complication (HCC)    Obesity (BMI 30-39.9) 10/23/2017    Current Outpatient Medications:    carvedilol  (COREG ) 12.5 MG tablet, TAKE 1&1/2 TABLETS BY MOUTH TWICE DAILY,  Disp: 270 tablet, Rfl: 3   desloratadine  (CLARINEX ) 5 MG tablet, Take 1 tablet (5 mg total) by mouth daily as needed (allergies)., Disp: 90 tablet, Rfl: 3   diclofenac  Sodium (VOLTAREN ) 1 % GEL, Voltaren  1 % topical gel, Disp: , Rfl:    doxycycline  (VIBRAMYCIN ) 100 MG capsule, Take 1 capsule (100 mg total) by mouth every 12 (twelve) hours., Disp: 56 capsule, Rfl: 0   Empagliflozin -metFORMIN  HCl ER (SYNJARDY  XR) 25-1000 MG TB24, Take 1 tablet by mouth daily. To replace Trijardy , Disp: 90 tablet, Rfl: 3   fluticasone  (FLONASE ) 50 MCG/ACT nasal spray, Place 2 sprays into both nostrils daily., Disp: 16 g, Rfl: 6   furosemide  (LASIX ) 20 MG tablet, TAKE ONE TABLET BY MOUTH DAILY AS NEEDED, Disp: 90 tablet, Rfl: 2   GEMTESA  75 MG TABS, TAKE ONE (1) TABLET BY MOUTH EVERY DAY, Disp: 30 tablet, Rfl: 2   meclizine  (ANTIVERT ) 12.5 MG tablet, TAKE 1-2 TABLETS BY MOUTH 3 TIMES DAILY AS NEEDED FOR DIZZINESS, Disp: 30 tablet, Rfl: 1   metFORMIN  (GLUCOPHAGE -XR) 500 MG 24 hr tablet, Take 1-2 tablets (500-1,000 mg total) by mouth every evening. With dinner, Disp: 60 tablet, Rfl: 3   pantoprazole  (PROTONIX ) 40 MG tablet, TAKE 1 TABLET BY MOUTH TWICE DAILY BEFORE MEALS, Disp: 180 tablet, Rfl: 0   potassium chloride  SA (KLOR-CON  M) 20 MEQ tablet, TAKE TWO TABLETS BY  MOUTH DAILY, Disp: 180 tablet, Rfl: 1   pravastatin  (PRAVACHOL ) 10 MG tablet, TAKE ONE (1) TABLET EACH DAY, Disp: 90 tablet, Rfl: 3   sacubitril -valsartan  (ENTRESTO ) 24-26 MG, Take 1 tablet by mouth 2 (two) times daily., Disp: 60 tablet, Rfl: 6   Semaglutide  (RYBELSUS ) 14 MG TABS, Take 1 tablet (14 mg total) by mouth daily. Start mont #2, Disp: 90 tablet, Rfl: 3   tamsulosin  (FLOMAX ) 0.4 MG CAPS capsule, Take 1 capsule (0.4 mg total) by mouth in the morning and at bedtime., Disp: 60 capsule, Rfl: 5   Vitamin D , Ergocalciferol , (DRISDOL ) 1.25 MG (50000 UNIT) CAPS capsule, Take 1 capsule (50,000 Units total) by mouth every 7 (seven) days., Disp: 12 capsule, Rfl:  3 Social History   Socioeconomic History   Marital status: Single    Spouse name: Not on file   Number of children: Not on file   Years of education: Not on file   Highest education level: 12th grade  Occupational History   Occupation: Disabled  Tobacco Use   Smoking status: Never   Smokeless tobacco: Never  Vaping Use   Vaping status: Never Used  Substance and Sexual Activity   Alcohol use: No   Drug use: No   Sexual activity: Not Currently  Other Topics Concern   Not on file  Social History Narrative   Autistic   Lives with Mother   Social Drivers of Health   Financial Resource Strain: Low Risk  (03/05/2023)   Overall Financial Resource Strain (CARDIA)    Difficulty of Paying Living Expenses: Not hard at all  Food Insecurity: No Food Insecurity (03/05/2023)   Hunger Vital Sign    Worried About Running Out of Food in the Last Year: Never true    Ran Out of Food in the Last Year: Never true  Transportation Needs: No Transportation Needs (03/05/2023)   PRAPARE - Administrator, Civil Service (Medical): No    Lack of Transportation (Non-Medical): No  Physical Activity: Insufficiently Active (03/05/2023)   Exercise Vital Sign    Days of Exercise per Week: 3 days    Minutes of Exercise per Session: 30 min  Stress: No Stress Concern Present (03/05/2023)   [employer] of Occupational Health - Occupational Stress Questionnaire    Feeling of Stress : Not at all  Social Connections: Moderately Isolated (03/05/2023)   Social Connection and Isolation Panel    Frequency of Communication with Friends and Family: More than three times a week    Frequency of Social Gatherings with Friends and Family: Three times a week    Attends Religious Services: 1 to 4 times per year    Active Member of Clubs or Organizations: No    Attends Banker Meetings: Never    Marital Status: Never married  Intimate Partner Violence: Not At Risk (03/05/2023)    Humiliation, Afraid, Rape, and Kick questionnaire    Fear of Current or Ex-Partner: No    Emotionally Abused: No    Physically Abused: No    Sexually Abused: No   Family History  Problem Relation Age of Onset   Diabetes Mother    Diabetes Father    Bladder Cancer Father    Colon cancer Neg Hx    Esophageal cancer Neg Hx    Liver cancer Neg Hx    Stomach cancer Neg Hx     Objective: Office vital signs reviewed. BP 126/85   Pulse 75   Temp (!) 96.3 F (  35.7 C)   Ht 6' 3 (1.905 m)   Wt 243 lb (110.2 kg)   SpO2 93%   BMI 30.37 kg/m   Physical Examination:  General: Awake, alert, well nourished, No acute distress HEENT: sclera white, MMM Cardio: regular rate and rhythm, S1S2 heard, no murmurs appreciated Pulm: clear to auscultation bilaterally, no wheezes, rhonchi or rales; normal work of breathing on room air  Assessment/ Plan: 53 y.o. male   Diabetes mellitus treated with oral medication (HCC) - Plan: Bayer DCA Hb A1c Waived, CMP14+EGFR, Microalbumin / creatinine urine ratio  Hypertension associated with type 2 diabetes mellitus (HCC) - Plan: CMP14+EGFR  Hyperlipidemia associated with type 2 diabetes mellitus (HCC) - Plan: CMP14+EGFR, Lipid panel  Urine microalbumin collected.  A1c demonstrates downtrending A1c but still elevated at 7.9.  We were able to contact his pharmacist today and he has not been getting the metformin  nor the Rybelsus  in almost a year now.  We discussed potentially switching him over to Ozempic and his sister is going to discuss this with their mom.  It may simplify regimen for him.  Also will give him cardiac risk reduction  Lipid panel collected as well today  Blood pressure controlled  Korbin Mapps CHRISTELLA Fielding, DO [facility] [phone]

## 2023-11-10 LAB — CMP14+EGFR
ALT: 12 IU/L (ref 0–44)
AST: 12 IU/L (ref 0–40)
Albumin: 4 g/dL (ref 3.8–4.9)
Alkaline Phosphatase: 33 IU/L — ABNORMAL LOW (ref 44–121)
BUN/Creatinine Ratio: 14 (ref 9–20)
BUN: 22 mg/dL (ref 6–24)
Bilirubin Total: 0.5 mg/dL (ref 0.0–1.2)
CO2: 24 mmol/L (ref 20–29)
Calcium: 9.3 mg/dL (ref 8.7–10.2)
Chloride: 102 mmol/L (ref 96–106)
Creatinine, Ser: 1.59 mg/dL — ABNORMAL HIGH (ref 0.76–1.27)
Globulin, Total: 2.4 g/dL (ref 1.5–4.5)
Glucose: 155 mg/dL — ABNORMAL HIGH (ref 70–99)
Potassium: 4.3 mmol/L (ref 3.5–5.2)
Sodium: 142 mmol/L (ref 134–144)
Total Protein: 6.4 g/dL (ref 6.0–8.5)
eGFR: 52 mL/min/1.73 — ABNORMAL LOW (ref >59)

## 2023-11-10 LAB — LIPID PANEL
Chol/HDL Ratio: 3.1 ratio (ref 0.0–5.0)
Cholesterol, Total: 141 mg/dL (ref 100–199)
HDL: 45 mg/dL (ref >39)
LDL Chol Calc (NIH): 73 mg/dL (ref 0–99)
Triglycerides: 128 mg/dL (ref 0–149)
VLDL Cholesterol Cal: 23 mg/dL (ref 5–40)

## 2023-11-10 LAB — MICROALBUMIN / CREATININE URINE RATIO
Creatinine, Urine: 68.8 mg/dL
Microalb/Creat Ratio: 1229 mg/g{creat} — ABNORMAL HIGH (ref 0–29)
Microalbumin, Urine: 845.5 ug/mL

## 2023-11-12 ENCOUNTER — Ambulatory Visit: Payer: Self-pay | Admitting: Family Medicine

## 2023-11-12 DIAGNOSIS — E119 Type 2 diabetes mellitus without complications: Secondary | ICD-10-CM

## 2023-11-13 MED ORDER — OZEMPIC (0.25 OR 0.5 MG/DOSE) 2 MG/3ML ~~LOC~~ SOPN
PEN_INJECTOR | SUBCUTANEOUS | 3 refills | Status: AC
Start: 2023-11-13 — End: 2024-12-10

## 2023-11-22 ENCOUNTER — Other Ambulatory Visit: Payer: Self-pay | Admitting: Urology

## 2023-11-27 ENCOUNTER — Other Ambulatory Visit: Payer: Self-pay | Admitting: Cardiology

## 2023-11-27 ENCOUNTER — Telehealth: Payer: Self-pay

## 2023-11-27 DIAGNOSIS — I152 Hypertension secondary to endocrine disorders: Secondary | ICD-10-CM

## 2023-11-27 DIAGNOSIS — I420 Dilated cardiomyopathy: Secondary | ICD-10-CM

## 2023-11-27 DIAGNOSIS — I428 Other cardiomyopathies: Secondary | ICD-10-CM

## 2023-11-27 DIAGNOSIS — I5022 Chronic systolic (congestive) heart failure: Secondary | ICD-10-CM

## 2023-11-27 DIAGNOSIS — G4733 Obstructive sleep apnea (adult) (pediatric): Secondary | ICD-10-CM

## 2023-11-27 DIAGNOSIS — E669 Obesity, unspecified: Secondary | ICD-10-CM

## 2023-11-27 NOTE — Addendum Note (Signed)
 Addended by: DELFINO AUGUSTIN BROCKS on: 11/27/2023 05:14 PM   Modules accepted: Orders

## 2023-11-27 NOTE — Telephone Encounter (Signed)
 Patient's sister, Augusto Deckman, has LVM on the sleep lab phone about scheduling this patient's Home Sleep Study - I do not see any orders. If the patient is supposed to complete a sleep study could someone please place those orders so the sleep lab can obtain an auth to schedule? Thank you!!

## 2023-11-27 NOTE — Telephone Encounter (Signed)
 Order placed for the pt for HST

## 2023-11-28 ENCOUNTER — Other Ambulatory Visit: Payer: Self-pay | Admitting: Neurology

## 2023-11-28 DIAGNOSIS — I420 Dilated cardiomyopathy: Secondary | ICD-10-CM

## 2023-11-28 DIAGNOSIS — I5022 Chronic systolic (congestive) heart failure: Secondary | ICD-10-CM

## 2023-11-28 DIAGNOSIS — J3089 Other allergic rhinitis: Secondary | ICD-10-CM

## 2023-11-28 DIAGNOSIS — G4733 Obstructive sleep apnea (adult) (pediatric): Secondary | ICD-10-CM

## 2023-11-28 NOTE — Telephone Encounter (Signed)
 Noted, thank you [employer] medicare/medicaid no auth req for HST.

## 2023-11-28 NOTE — Progress Notes (Signed)
 HST order was in system.

## 2023-11-28 NOTE — Progress Notes (Signed)
 HST, this can be a watch pat: Sister will need to watch the set up and she will return the device in AM.

## 2023-12-04 DIAGNOSIS — L84 Corns and callosities: Secondary | ICD-10-CM | POA: Diagnosis not present

## 2023-12-04 DIAGNOSIS — M79675 Pain in left toe(s): Secondary | ICD-10-CM | POA: Diagnosis not present

## 2023-12-04 DIAGNOSIS — B351 Tinea unguium: Secondary | ICD-10-CM | POA: Diagnosis not present

## 2023-12-04 DIAGNOSIS — M79674 Pain in right toe(s): Secondary | ICD-10-CM | POA: Diagnosis not present

## 2023-12-04 DIAGNOSIS — E1142 Type 2 diabetes mellitus with diabetic polyneuropathy: Secondary | ICD-10-CM | POA: Diagnosis not present

## 2023-12-04 NOTE — Telephone Encounter (Signed)
*  mom or sister will pick up* HST- [employer] medicare/medicaid no auth req   Patient is scheduled at [facility] for 12/18/23 at 11 AM.  Mailed packet.

## 2023-12-06 ENCOUNTER — Ambulatory Visit: Payer: Self-pay | Admitting: Family Medicine

## 2023-12-06 DIAGNOSIS — H1045 Other chronic allergic conjunctivitis: Secondary | ICD-10-CM | POA: Diagnosis not present

## 2023-12-06 DIAGNOSIS — H0102A Squamous blepharitis right eye, upper and lower eyelids: Secondary | ICD-10-CM | POA: Diagnosis not present

## 2023-12-06 DIAGNOSIS — E119 Type 2 diabetes mellitus without complications: Secondary | ICD-10-CM | POA: Diagnosis not present

## 2023-12-06 DIAGNOSIS — H0102B Squamous blepharitis left eye, upper and lower eyelids: Secondary | ICD-10-CM | POA: Diagnosis not present

## 2023-12-06 LAB — HM DIABETES EYE EXAM

## 2023-12-17 ENCOUNTER — Ambulatory Visit (INDEPENDENT_AMBULATORY_CARE_PROVIDER_SITE_OTHER): Admitting: Urology

## 2023-12-17 ENCOUNTER — Encounter: Payer: Self-pay | Admitting: Urology

## 2023-12-17 VITALS — BP 132/84 | HR 98

## 2023-12-17 DIAGNOSIS — R351 Nocturia: Secondary | ICD-10-CM | POA: Diagnosis not present

## 2023-12-17 DIAGNOSIS — N401 Enlarged prostate with lower urinary tract symptoms: Secondary | ICD-10-CM

## 2023-12-17 DIAGNOSIS — N411 Chronic prostatitis: Secondary | ICD-10-CM

## 2023-12-17 DIAGNOSIS — R350 Frequency of micturition: Secondary | ICD-10-CM

## 2023-12-17 MED ORDER — SOLIFENACIN SUCCINATE 5 MG PO TABS: 5.0000 mg | ORAL_TABLET | Freq: Every day | ORAL | 11 refills | Status: AC

## 2023-12-17 NOTE — Patient Instructions (Signed)

## 2023-12-17 NOTE — Progress Notes (Signed)
 12/17/2023 3:43 PM   Michael Ramos [birth year] [id number]  Referring provider: Jolinda Norene HERO, DO [address] [place],  [place] [address]  Followup prostatitis   HPI: Mr Michael Ramos is a 52yo here for followup for BPH and chronic prostatitis. He was treated with 28 days of doxycycline  starting 10/2023. His pelvic pain has improved. His nocturia improved to 2x from 4-5x. He has intermittent straining to urinate. He has urinary frequency every 2 hours.    PMH: Past Medical History:  Diagnosis Date   Allergy    Autism    CHF (congestive heart failure) (HCC)    a. EF 20-25% by echo in 08/2016 b. repeat echo in 02/2017 showing EF 30-35% with cath showing normal cors.    Diabetes mellitus without complication (HCC)    Obesity (BMI 30-39.9) 10/23/2017    Surgical History: Past Surgical History:  Procedure Laterality Date   RIGHT/LEFT HEART CATH AND CORONARY ANGIOGRAPHY N/A 03/06/2017   Procedure: RIGHT/LEFT HEART CATH AND CORONARY ANGIOGRAPHY;  Surgeon: Michael Lonni BIRCH, MD;  Location: [facility];  Service: Cardiovascular;  Laterality: N/A;    Home Medications:  Allergies as of 12/17/2023   No Active Allergies      Medication List        Accurate as of December 17, 2023  3:43 PM. If you have any questions, ask your nurse or doctor.          Accu-Chek Guide Test test strip Generic drug: glucose blood 1 each by Other route in the morning, at noon, and at bedtime. Use as instructed   carvedilol  12.5 MG tablet Commonly known as: COREG  TAKE 1&1/2 TABLETS BY MOUTH TWICE DAILY   desloratadine  5 MG tablet Commonly known as: Clarinex  Take 1 tablet (5 mg total) by mouth daily as needed (allergies).   diclofenac  Sodium 1 % Gel Commonly known as: VOLTAREN  Voltaren  1 % topical gel   doxycycline  100 MG capsule Commonly known as: VIBRAMYCIN  TAKE 1 CAPSULE BY MOUTH EVERY 12 HOURS   fluticasone  50 MCG/ACT nasal spray Commonly known as: FLONASE  Place 2  sprays into both nostrils daily.   furosemide  20 MG tablet Commonly known as: LASIX  TAKE ONE TABLET BY MOUTH DAILY AS NEEDED   Gemtesa  75 MG Tabs Generic drug: Vibegron  TAKE ONE (1) TABLET BY MOUTH EVERY DAY   meclizine  12.5 MG tablet Commonly known as: ANTIVERT  TAKE 1-2 TABLETS BY MOUTH 3 TIMES DAILY AS NEEDED FOR DIZZINESS   Ozempic  (0.25 or 0.5 MG/DOSE) 2 MG/3ML Sopn Generic drug: Semaglutide (0.25 or 0.5MG /DOS) Inject 0.25 mg into the skin every 7 (seven) days for 28 days, THEN 0.5 mg every 7 (seven) days. Start taking on: November 13, 2023   pantoprazole  40 MG tablet Commonly known as: PROTONIX  TAKE 1 TABLET BY MOUTH TWICE DAILY BEFORE MEALS   potassium chloride  SA 20 MEQ tablet Commonly known as: KLOR-CON  M TAKE TWO TABLETS BY MOUTH DAILY   pravastatin  10 MG tablet Commonly known as: PRAVACHOL  TAKE ONE (1) TABLET EACH DAY   sacubitril -valsartan  24-26 MG Commonly known as: ENTRESTO  Take 1 tablet by mouth 2 (two) times daily.   Synjardy  XR 25-1000 MG Tb24 Generic drug: Empagliflozin -metFORMIN  HCl ER Take 1 tablet by mouth daily. To replace Trijardy    tamsulosin  0.4 MG Caps capsule Commonly known as: FLOMAX  Take 1 capsule (0.4 mg total) by mouth in the morning and at bedtime.   Vitamin D  (Ergocalciferol ) 1.25 MG (50000 UNIT) Caps capsule Commonly known as: DRISDOL  Take 1 capsule (  50,000 Units total) by mouth every 7 (seven) days.        Allergies: No Active Allergies  Family History: Family History  Problem Relation Age of Onset   Diabetes Mother    Diabetes Father    Bladder Cancer Father    Colon cancer Neg Hx    Esophageal cancer Neg Hx    Liver cancer Neg Hx    Stomach cancer Neg Hx     Social History:  reports that he has never smoked. He has never used smokeless tobacco. He reports that he does not drink alcohol and does not use drugs.  ROS: All other review of systems were reviewed and are negative except what is noted above in HPI  Physical  Exam: BP 132/84   Pulse 98   Constitutional:  Alert and oriented, No acute distress. HEENT: [place] AT, moist mucus membranes.  Trachea midline, no masses. Cardiovascular: No clubbing, cyanosis, or edema. Respiratory: Normal respiratory effort, no increased work of breathing. GI: Abdomen is soft, nontender, nondistended, no abdominal masses GU: No CVA tenderness.  Lymph: No cervical or inguinal lymphadenopathy. Skin: No rashes, bruises or suspicious lesions. Neurologic: Grossly intact, no focal deficits, moving all 4 extremities. Psychiatric: Normal mood and affect.  Laboratory Data: Lab Results  Component Value Date   WBC 9.2 08/07/2023   HGB 15.4 08/07/2023   HCT 47.9 08/07/2023   MCV 85.5 08/07/2023   PLT 237 08/07/2023    Lab Results  Component Value Date   CREATININE 1.59 (H) 11/09/2023    No results found for: PSA  No results found for: TESTOSTERONE  Lab Results  Component Value Date   HGBA1C 7.9 (H) 11/09/2023    Urinalysis    Component Value Date/Time   COLORURINE YELLOW 09/09/2018 0420   APPEARANCEUR Clear 10/10/2023 1322   LABSPEC 1.018 09/09/2018 0420   PHURINE 5.0 09/09/2018 0420   GLUCOSEU 3+ (A) 10/10/2023 1322   HGBUR LARGE (A) 09/09/2018 0420   BILIRUBINUR Negative 10/10/2023 1322   KETONESUR NEGATIVE 09/09/2018 0420   PROTEINUR 2+ (A) 10/10/2023 1322   PROTEINUR 30 (A) 09/09/2018 0420   NITRITE Negative 10/10/2023 1322   NITRITE NEGATIVE 09/09/2018 0420   LEUKOCYTESUR Negative 10/10/2023 1322   LEUKOCYTESUR TRACE (A) 09/09/2018 0420    Lab Results  Component Value Date   LABMICR 845.5 11/09/2023   WBCUA 0-5 10/10/2023   LABEPIT 0-10 10/10/2023   BACTERIA None seen 10/10/2023    Pertinent Imaging:  No results found for this or any previous visit.  No results found for this or any previous visit.  No results found for this or any previous visit.  No results found for this or any previous visit.  No results found for this or any  previous visit.  No results found for this or any previous visit.  No results found for this or any previous visit.  No results found for this or any previous visit.   Assessment & Plan:    1. Benign prostatic hyperplasia with urinary frequency (Primary) -continue flomax  0.4mg  BID - Urinalysis, Routine w reflex microscopic  2. Nocturia -we will trial vesicare  5mg  daily in place of the gemtesa   3. Chronic prostatitis without hematuria -resolved   No follow-ups on file.  Belvie Clara, MD  [facility] 

## 2023-12-18 ENCOUNTER — Ambulatory Visit (INDEPENDENT_AMBULATORY_CARE_PROVIDER_SITE_OTHER): Admitting: Neurology

## 2023-12-18 DIAGNOSIS — I5022 Chronic systolic (congestive) heart failure: Secondary | ICD-10-CM

## 2023-12-18 DIAGNOSIS — I420 Dilated cardiomyopathy: Secondary | ICD-10-CM

## 2023-12-18 DIAGNOSIS — I152 Hypertension secondary to endocrine disorders: Secondary | ICD-10-CM

## 2023-12-18 DIAGNOSIS — I428 Other cardiomyopathies: Secondary | ICD-10-CM

## 2023-12-18 DIAGNOSIS — E669 Obesity, unspecified: Secondary | ICD-10-CM

## 2023-12-18 DIAGNOSIS — G4733 Obstructive sleep apnea (adult) (pediatric): Secondary | ICD-10-CM | POA: Diagnosis not present

## 2023-12-18 LAB — URINALYSIS, ROUTINE W REFLEX MICROSCOPIC
Bilirubin, UA: NEGATIVE
Ketones, UA: NEGATIVE
Leukocytes,UA: NEGATIVE
Nitrite, UA: NEGATIVE
Specific Gravity, UA: 1.02 (ref 1.005–1.030)
Urobilinogen, Ur: 0.2 mg/dL (ref 0.2–1.0)
pH, UA: 6 (ref 5.0–7.5)

## 2023-12-18 LAB — MICROSCOPIC EXAMINATION: Bacteria, UA: NONE SEEN

## 2023-12-19 NOTE — Progress Notes (Unsigned)
 Michael Ramos

## 2023-12-21 ENCOUNTER — Ambulatory Visit: Payer: Self-pay | Admitting: Neurology

## 2023-12-21 NOTE — Procedures (Signed)
 [facility]  Michael Ramos 53 year old male 03-12-1971   HOME SLEEP TEST REPORT ( by Watch PAT)   STUDY DATE:  12-19-2023   ORDERING CLINICIAN: Dedra Gores, MD  REFERRING CLINICIANBETHA Fielding, DO   CLINICAL INFORMATION/HISTORY: GERD, Biliary obstruction, autism spectrum, BPH, DM type 2, Nocturia , dilated cardiomyopathy.   Was admitted at [facility] hospital for vomiting and while on telemetry there his oxygen  level dropped frequently and gave concern for apnea.  I have the pleasure of seeing Michael Ramos 09/10/23 per referral from dr Fielding, DO,  for a possible sleep disorder. He is sleepier and was sleeping when I entered the room , is yawning.  He is verbal.  His sister brought him to this appointment .    Sleep relevant medical history: Nocturia 1-2, No Tonsillectomy, but history of obesity, hip bursitis, asymmetric shoulders.  Family medical /sleep history: A brother with OSA.    Epworth sleepiness score: 16/ 24 points - he is not driving.  FSS endorsed at 39/ 63 points.    BMI:  31 kg/m   Neck Circumference: 18     Sleep Summary:   Total Recording Time (hours, min):     9 h 14 m   Total Sleep Time (hours, min):   8 h 53 m              Percent REM (%):  32.6%                                      Respiratory Indices:   Calculated pAHI (per AASM  guideline): 3.5/h                       REM pAHI:   6.7/h                                               NREM pAHI:  2.1/h                            Positional AHI:   all non - supine sleep : Prone, left and right lateral.     Snoring:   Mean  volume 40 dB                                           Oxygen  Saturation Statistics:   Oxygen  Saturation (%) Mean:  95% , between 91 and 99%.              O2 Saturation (minutes) <89%:   0 minutes         Pulse Rate Statistics:   Pulse Mean (bpm):  79 bpm , between 63 and 115 bpm.                            IMPRESSION:  This HST did  not detect sleep apnea, nor sleep hypoxia or loud snoring.    RECOMMENDATION: no sleep related intervention is necessary , follow up with referring provider.  Any patient should be cautioned not to drive, work at heights, or operate dangerous or heavy equipment when tired or sleepy.   Review of good sleep hygiene measures is accessible to any sleep clinic patient and can be reiterated through online material- I we recommend the Guide to better Sleep   by the NIH.   Weight loss and Core Strength improvement is highly recommended for individuals with low muscle tone and/ or a BMI over 30.  Any CPAP patient should be reminded to be fully compliant with PAP therapy , (defined as using PAP therapy for more than 4 hours each night ) with the goal to improve sleep related symptoms and decrease long term cardiovascular risks. Any PAP therapy patient should be reminded, that it may take up to 3 months to get fully used to using PAP and it may take 1-2 weeks for an established CPAP user to acclimatize to changes in pressure or mask. The earlier full compliance is achieved, the better long term compliance tends to be.   Please note that untreated obstructive sleep apnea may carry additional perioperative morbidity. Patients with significant obstructive sleep apnea should receive perioperative PAP therapy and the surgical team should be informed of the diagnosis and degree of sleep disordered breathing.  Sleep fragmentation in the presence of normal proportional sleep stages is a nonspecific findings and per se does not signify an intrinsic sleep disorder or a cause for the patient's sleep-related symptoms.  Causes include (but are not limited to) the unfamiliarity of sleeping while recorded by HST device or sleeping in a sleep lab for a full Polysomnography sleep study, but also circadian rhythm disturbances, medication side effects or an underlying mood disorder or medical problem.   The referring  physician will be notified of the test results.       INTERPRETING PHYSICIAN:   Dedra Gores, MD  [facility] Board certified by The [employer] of Sleep Medicine and Diplomate of the [employer] of Sleep Medicine. Board certified In Neurology through the ABPN, Fellow of the [employer] of Neu[employer].

## 2023-12-26 NOTE — Telephone Encounter (Signed)
-----   Message from Brent Dohmeier sent at 12/21/2023  3:16 PM EDT ----- Negative HST for hypoxia or apnea. No intervention or follow up needed.  ----- Message ----- From: Chalice Saunas, MD Sent: 12/21/2023   3:14 PM EDT To: Saunas Chalice, MD

## 2023-12-26 NOTE — Telephone Encounter (Signed)
 Lvm 1st attempt by hf 12/26/23

## 2023-12-31 ENCOUNTER — Other Ambulatory Visit: Payer: Self-pay | Admitting: Cardiology

## 2024-02-11 ENCOUNTER — Encounter: Payer: Self-pay | Admitting: Family Medicine

## 2024-02-11 ENCOUNTER — Ambulatory Visit (INDEPENDENT_AMBULATORY_CARE_PROVIDER_SITE_OTHER): Payer: Self-pay | Admitting: Family Medicine

## 2024-02-11 ENCOUNTER — Ambulatory Visit: Payer: Self-pay | Admitting: Family Medicine

## 2024-02-11 VITALS — BP 122/80 | HR 88 | Temp 97.4°F | Ht 75.0 in | Wt 238.5 lb

## 2024-02-11 DIAGNOSIS — Z7985 Long-term (current) use of injectable non-insulin antidiabetic drugs: Secondary | ICD-10-CM | POA: Diagnosis not present

## 2024-02-11 DIAGNOSIS — E1122 Type 2 diabetes mellitus with diabetic chronic kidney disease: Secondary | ICD-10-CM

## 2024-02-11 DIAGNOSIS — E1169 Type 2 diabetes mellitus with other specified complication: Secondary | ICD-10-CM

## 2024-02-11 DIAGNOSIS — Z789 Other specified health status: Secondary | ICD-10-CM

## 2024-02-11 DIAGNOSIS — E1159 Type 2 diabetes mellitus with other circulatory complications: Secondary | ICD-10-CM

## 2024-02-11 DIAGNOSIS — Z23 Encounter for immunization: Secondary | ICD-10-CM

## 2024-02-11 DIAGNOSIS — N1831 Chronic kidney disease, stage 3a: Secondary | ICD-10-CM

## 2024-02-11 DIAGNOSIS — E785 Hyperlipidemia, unspecified: Secondary | ICD-10-CM

## 2024-02-11 DIAGNOSIS — I152 Hypertension secondary to endocrine disorders: Secondary | ICD-10-CM

## 2024-02-11 LAB — BAYER DCA HB A1C WAIVED: HB A1C (BAYER DCA - WAIVED): 7.1 % — ABNORMAL HIGH (ref 4.8–5.6)

## 2024-02-11 MED ORDER — SYNJARDY XR 25-1000 MG PO TB24: 1.0000 | ORAL_TABLET | Freq: Every day | ORAL | 3 refills | Status: AC

## 2024-02-11 MED ORDER — PRAVASTATIN SODIUM 10 MG PO TABS: ORAL_TABLET | ORAL | 3 refills | Status: AC

## 2024-02-11 NOTE — Progress Notes (Signed)
 Subjective: CC:DM PCP: Michael Michael HERO, DO YEP:Qmjwxpz [name] is a 53 y.o. male presenting to clinic today for:  Type 2 Diabetes with hypertension, hyperlipidemia:  Patient is brought to the office by his sister.  He has been tolerating Ozempic  0.25 mg weekly without any difficulty.  They never advance to the 0.5 mg weekly because they did not realize they were supposed to and she notes it was not put on the instructions by pharmacy.  He is compliant with Synjardy  XR.  Denies any genital symptoms.  He is actually urinating much better now that his urologist has added some Flomax  and Vesicare .  No more grunting with urination.  Diabetes Health Maintenance Due  Topic Date Due   FOOT EXAM  10/24/2023   HEMOGLOBIN A1C  05/11/2024   OPHTHALMOLOGY EXAM  12/05/2024    ROS: Per HPI  No Active Allergies Past Medical History:  Diagnosis Date   Allergy    Autism    CHF (congestive heart failure) (HCC)    a. EF 20-25% by echo in 08/2016 b. repeat echo in 02/2017 showing EF 30-35% with cath showing normal cors.    Diabetes mellitus without complication (HCC)    Obesity (BMI 30-39.9) 10/23/2017    Current Outpatient Medications:    carvedilol  (COREG ) 12.5 MG tablet, TAKE 1&1/2 TABLETS BY MOUTH TWICE DAILY, Disp: 270 tablet, Rfl: 3   desloratadine  (CLARINEX ) 5 MG tablet, Take 1 tablet (5 mg total) by mouth daily as needed (allergies)., Disp: 90 tablet, Rfl: 3   diclofenac  Sodium (VOLTAREN ) 1 % GEL, Voltaren  1 % topical gel, Disp: , Rfl:    doxycycline  (VIBRAMYCIN ) 100 MG capsule, TAKE 1 CAPSULE BY MOUTH EVERY 12 HOURS, Disp: 56 capsule, Rfl: 0   Empagliflozin -metFORMIN  HCl ER (SYNJARDY  XR) 25-1000 MG TB24, Take 1 tablet by mouth daily. To replace Trijardy , Disp: 90 tablet, Rfl: 3   fluticasone  (FLONASE ) 50 MCG/ACT nasal spray, Place 2 sprays into both nostrils daily., Disp: 16 g, Rfl: 6   furosemide  (LASIX ) 20 MG tablet, TAKE ONE TABLET BY MOUTH DAILY AS NEEDED, Disp: 90 tablet, Rfl: 2    glucose blood (ACCU-CHEK GUIDE TEST) test strip, 1 each by Other route in the morning, at noon, and at bedtime. Use as instructed, Disp: , Rfl:    meclizine  (ANTIVERT ) 12.5 MG tablet, TAKE 1-2 TABLETS BY MOUTH 3 TIMES DAILY AS NEEDED FOR DIZZINESS, Disp: 30 tablet, Rfl: 1   pantoprazole  (PROTONIX ) 40 MG tablet, TAKE 1 TABLET BY MOUTH TWICE DAILY BEFORE MEALS, Disp: 180 tablet, Rfl: 0   potassium chloride  SA (KLOR-CON  M) 20 MEQ tablet, TAKE TWO TABLETS BY MOUTH DAILY, Disp: 180 tablet, Rfl: 1   pravastatin  (PRAVACHOL ) 10 MG tablet, TAKE ONE (1) TABLET EACH DAY, Disp: 90 tablet, Rfl: 3   sacubitril -valsartan  (ENTRESTO ) 24-26 MG, Take 1 tablet by mouth 2 (two) times daily., Disp: 60 tablet, Rfl: 6   Semaglutide ,0.25 or 0.5MG /DOS, (OZEMPIC , 0.25 OR 0.5 MG/DOSE,) 2 MG/3ML SOPN, Inject 0.25 mg into the skin every 7 (seven) days for 28 days, THEN 0.5 mg every 7 (seven) days., Disp: 9 mL, Rfl: 3   solifenacin  (VESICARE ) 5 MG tablet, Take 1 tablet (5 mg total) by mouth daily., Disp: 30 tablet, Rfl: 11   tamsulosin  (FLOMAX ) 0.4 MG CAPS capsule, Take 1 capsule (0.4 mg total) by mouth in the morning and at bedtime., Disp: 60 capsule, Rfl: 5   Vitamin D , Ergocalciferol , (DRISDOL ) 1.25 MG (50000 UNIT) CAPS capsule, Take 1 capsule (50,000 Units total)  by mouth every 7 (seven) days., Disp: 12 capsule, Rfl: 3 Social History   Socioeconomic History   Marital status: Single    Spouse name: Not on file   Number of children: Not on file   Years of education: Not on file   Highest education level: 12th grade  Occupational History   Occupation: Disabled  Tobacco Use   Smoking status: Never   Smokeless tobacco: Never  Vaping Use   Vaping status: Never Used  Substance and Sexual Activity   Alcohol use: No   Drug use: No   Sexual activity: Not Currently  Other Topics Concern   Not on file  Social History Narrative   Autistic   Lives with Mother   Social Drivers of Health   Financial Resource Strain: Low  Risk  (03/05/2023)   Overall Financial Resource Strain (CARDIA)    Difficulty of Paying Living Expenses: Not hard at all  Food Insecurity: No Food Insecurity (03/05/2023)   Hunger Vital Sign    Worried About Running Out of Food in the Last Year: Never true    Ran Out of Food in the Last Year: Never true  Transportation Needs: No Transportation Needs (03/05/2023)   PRAPARE - Administrator, Civil Service (Medical): No    Lack of Transportation (Non-Medical): No  Physical Activity: Insufficiently Active (03/05/2023)   Exercise Vital Sign    Days of Exercise per Week: 3 days    Minutes of Exercise per Session: 30 min  Stress: No Stress Concern Present (03/05/2023)   [employer] of Occupational Health - Occupational Stress Questionnaire    Feeling of Stress : Not at all  Social Connections: Moderately Isolated (03/05/2023)   Social Connection and Isolation Panel    Frequency of Communication with Friends and Family: More than three times a week    Frequency of Social Gatherings with Friends and Family: Three times a week    Attends Religious Services: 1 to 4 times per year    Active Member of Clubs or Organizations: No    Attends Banker Meetings: Never    Marital Status: Never married  Intimate Partner Violence: Not At Risk (03/05/2023)   Humiliation, Afraid, Rape, and Kick questionnaire    Fear of Current or Ex-Partner: No    Emotionally Abused: No    Physically Abused: No    Sexually Abused: No   Family History  Problem Relation Age of Onset   Diabetes Mother    Diabetes Father    Bladder Cancer Father    Colon cancer Neg Hx    Esophageal cancer Neg Hx    Liver cancer Neg Hx    Stomach cancer Neg Hx     Objective: Office vital signs reviewed. BP 122/80   Pulse 88   Temp (!) 97.4 F (36.3 C)   Ht 6' 3 (1.905 m)   Wt 238 lb 8 oz (108.2 kg)   SpO2 94%   BMI 29.81 kg/m   Physical Examination:  General: Awake, alert, well nourished, No  acute distress HEENT: sclera white, MMM Cardio: regular rate and rhythm, S1S2 heard, no murmurs appreciated Pulm: clear to auscultation bilaterally, no wheezes, rhonchi or rales; normal work of breathing on room air Diabetic Foot Exam - Simple   Simple Foot Form Diabetic Foot exam was performed with the following findings: Yes 02/11/2024  1:14 PM  Visual Inspection See comments: Yes Sensation Testing Intact to touch and monofilament testing bilaterally: Yes Pulse Check Posterior  Tibialis and Dorsalis pulse intact bilaterally: Yes Comments Onychomycotic changes to the nails bilaterally.     Lab Results  Component Value Date   HGBA1C 7.9 (H) 11/09/2023    Assessment/ Plan: 53 y.o. male   Type 2 diabetes mellitus with stage 3a chronic kidney disease, without long-term current use of insulin  (HCC) - Plan: Bayer DCA Hb A1c Waived, Empagliflozin -metFORMIN  HCl ER (SYNJARDY  XR) 25-1000 MG TB24  Hypertension associated with type 2 diabetes mellitus (HCC)  Hyperlipidemia associated with type 2 diabetes mellitus (HCC) - Plan: pravastatin  (PRAVACHOL ) 10 MG tablet  Hepatitis B vaccination status unknown - Plan: Hepatitis B surface antibody,quantitative  Immunization due - Plan: Pneumococcal conjugate vaccine 20-valent (Prevnar 20)  Pneumococcal vaccination administered.  Will check titers for hepatitis B.  Reinforced that if blood sugar was not at goal with the 0.25 mg of Ozempic  we will plan to advance to 0.5 mg as originally indicated on prescription.  He will continue all medications otherwise as prescribed   Michael CHRISTELLA Fielding, DO [facility] [phone]

## 2024-02-12 LAB — HEPATITIS B SURFACE ANTIBODY, QUANTITATIVE: Hepatitis B Surf Ab Quant: <3.5 m[IU]/mL — ABNORMAL LOW

## 2024-02-13 ENCOUNTER — Ambulatory Visit (INDEPENDENT_AMBULATORY_CARE_PROVIDER_SITE_OTHER): Admitting: *Deleted

## 2024-02-13 DIAGNOSIS — Z23 Encounter for immunization: Secondary | ICD-10-CM

## 2024-02-13 NOTE — Progress Notes (Signed)
 Patient is in office today for a nurse visit for Immunization. Injection was given in the  Right deltoid. Patient tolerated injection well.

## 2024-02-25 ENCOUNTER — Ambulatory Visit: Payer: Self-pay

## 2024-02-25 ENCOUNTER — Encounter: Payer: Self-pay | Admitting: Family Medicine

## 2024-02-25 ENCOUNTER — Ambulatory Visit (INDEPENDENT_AMBULATORY_CARE_PROVIDER_SITE_OTHER): Admitting: Family Medicine

## 2024-02-25 VITALS — BP 123/82 | HR 87 | Temp 97.2°F | Ht 75.0 in | Wt 236.4 lb

## 2024-02-25 DIAGNOSIS — N3001 Acute cystitis with hematuria: Secondary | ICD-10-CM

## 2024-02-25 LAB — URINALYSIS, COMPLETE
Bilirubin, UA: NEGATIVE
Ketones, UA: NEGATIVE
Leukocytes,UA: NEGATIVE
Nitrite, UA: NEGATIVE
Specific Gravity, UA: 1.02 (ref 1.005–1.030)
Urobilinogen, Ur: 0.2 mg/dL (ref 0.2–1.0)
pH, UA: 5.5 (ref 5.0–7.5)

## 2024-02-25 LAB — MICROSCOPIC EXAMINATION
RBC, Urine: >30 /HPF — AB (ref 0–2)
Renal Epithel, UA: NONE SEEN /HPF
Yeast, UA: NONE SEEN

## 2024-02-25 MED ORDER — CEFTRIAXONE SODIUM 1 G IJ SOLR
1.0000 g | Freq: Once | INTRAMUSCULAR | Status: AC
  Administered 2024-02-25: 1 g via INTRAMUSCULAR

## 2024-02-25 MED ORDER — CEPHALEXIN 500 MG PO CAPS: 500.0000 mg | ORAL_CAPSULE | Freq: Four times a day (QID) | ORAL | 0 refills | Status: AC

## 2024-02-25 NOTE — Telephone Encounter (Signed)
 FYI Only or Action Required?: FYI only for provider: appointment scheduled on 02/25/24.  Patient was last seen in primary care on 02/11/2024 by Jolinda Norene HERO, DO.  Called Nurse Triage reporting Urine Output.  Symptoms began several days ago.  Interventions attempted: Rest, hydration, or home remedies.  Symptoms are: unchanged.  Triage Disposition: See Physician Within 24 Hours  Patient/caregiver understands and will follow disposition?: Yes          Copied from CRM #[id number]. Topic: Clinical - Red Word Triage >> Feb 25, 2024  8:08 AM Merlynn LABOR wrote: Red Word that prompted transfer to Nurse Triage: Urine is turning light purple, +2 days Reason for Disposition  Bad or foul-smelling urine    Pt already had AV scheduled.  Answer Assessment - Initial Assessment Questions 1. COLOR: What color is it?      purple 2. ODOR: How would you describe the odor?     *No Answer* 3. ONSET: When was the unusual color (or odor) first noted?     > 2 days 4. SYMPTOMS: Does your child have any other symptoms?      *No Answer* 5. CAUSE: Has your child eaten any unusual foods? Are they taking any medicines? (Use the following list for more directed questions)     Reports increase in ozempic   Answer Assessment - Initial Assessment Questions 1. SYMPTOM: What's the main symptom you're concerned about? (e.g., frequency, incontinence)     Color change 2. ONSET: When did the  sx  start?     2+ days 3. PAIN: Is there any pain? If Yes, ask: How bad is it? (Scale: 1-10; mild, moderate, severe)     *No Answer* 4. CAUSE: What do you think is causing the symptoms?     Sister endorses recent increase in Ozempic  5. OTHER SYMPTOMS: Do you have any other symptoms? (e.g., blood in urine, fever, flank pain, pain with urination)     *No Answer* 6. PREGNANCY: Is there any chance you are pregnant? When was your last menstrual period?     N/a  Protocols used: Urine - Unusual  Color or Odor-P-AH, Urinary Symptoms-A-AH

## 2024-02-25 NOTE — Progress Notes (Signed)
 Subjective: CC: Purple urine PCP: Jolinda Norene HERO, DO YEP:Qmjwxpz Michael Ramos is a 53 y.o. male presenting to clinic today for:  Patient is brought to the office by his sister who notes that his mother stated that he had purple urine this morning.  Patient denies having purple urine.  He denies any dysuria, hematuria, flank pain etc.  He has been feeling ill since he increased his Ozempic  to 0.5 mg last Thursday.  He actually laid in bed for almost 2 days because he is feeling sick on his stomach and really tired.  He was seen at urgent care for sore throat but tested for strep and this was negative.  He reports sore throat has really gotten a lot better.  He is tolerating p.o. intake without difficulty and hydrating his normal amounts.   ROS: Per HPI  No Active Allergies Past Medical History:  Diagnosis Date   Allergy    Autism    CHF (congestive heart failure) (HCC)    a. EF 20-25% by echo in 08/2016 b. repeat echo in 02/2017 showing EF 30-35% with cath showing normal cors.    Diabetes mellitus without complication (HCC)    Obesity (BMI 30-39.9) 10/23/2017    Current Outpatient Medications:    carvedilol  (COREG ) 12.5 MG tablet, TAKE 1&1/2 TABLETS BY MOUTH TWICE DAILY, Disp: 270 tablet, Rfl: 3   desloratadine  (CLARINEX ) 5 MG tablet, Take 1 tablet (5 mg total) by mouth daily as needed (allergies)., Disp: 90 tablet, Rfl: 3   diclofenac  Sodium (VOLTAREN ) 1 % GEL, Voltaren  1 % topical gel, Disp: , Rfl:    doxycycline  (VIBRAMYCIN ) 100 MG capsule, TAKE 1 CAPSULE BY MOUTH EVERY 12 HOURS, Disp: 56 capsule, Rfl: 0   Empagliflozin -metFORMIN  HCl ER (SYNJARDY  XR) 25-1000 MG TB24, Take 1 tablet by mouth daily. To replace Trijardy , Disp: 90 tablet, Rfl: 3   fluticasone  (FLONASE ) 50 MCG/ACT nasal spray, Place 2 sprays into both nostrils daily., Disp: 16 g, Rfl: 6   furosemide  (LASIX ) 20 MG tablet, TAKE ONE TABLET BY MOUTH DAILY AS NEEDED, Disp: 90 tablet, Rfl: 2   glucose blood (ACCU-CHEK GUIDE  TEST) test strip, 1 each by Other route in the morning, at noon, and at bedtime. Use as instructed, Disp: , Rfl:    meclizine  (ANTIVERT ) 12.5 MG tablet, TAKE 1-2 TABLETS BY MOUTH 3 TIMES DAILY AS NEEDED FOR DIZZINESS, Disp: 30 tablet, Rfl: 1   pantoprazole  (PROTONIX ) 40 MG tablet, TAKE 1 TABLET BY MOUTH TWICE DAILY BEFORE MEALS, Disp: 180 tablet, Rfl: 0   potassium chloride  SA (KLOR-CON  M) 20 MEQ tablet, TAKE TWO TABLETS BY MOUTH DAILY, Disp: 180 tablet, Rfl: 1   pravastatin  (PRAVACHOL ) 10 MG tablet, TAKE ONE (1) TABLET EACH DAY, Disp: 90 tablet, Rfl: 3   sacubitril -valsartan  (ENTRESTO ) 24-26 MG, Take 1 tablet by mouth 2 (two) times daily., Disp: 60 tablet, Rfl: 6   Semaglutide ,0.25 or 0.5MG /DOS, (OZEMPIC , 0.25 OR 0.5 MG/DOSE,) 2 MG/3ML SOPN, Inject 0.25 mg into the skin every 7 (seven) days for 28 days, THEN 0.5 mg every 7 (seven) days., Disp: 9 mL, Rfl: 3   solifenacin  (VESICARE ) 5 MG tablet, Take 1 tablet (5 mg total) by mouth daily., Disp: 30 tablet, Rfl: 11   tamsulosin  (FLOMAX ) 0.4 MG CAPS capsule, Take 1 capsule (0.4 mg total) by mouth in the morning and at bedtime., Disp: 60 capsule, Rfl: 5   Vitamin D , Ergocalciferol , (DRISDOL ) 1.25 MG (50000 UNIT) CAPS capsule, Take 1 capsule (50,000 Units total) by mouth every  7 (seven) days., Disp: 12 capsule, Rfl: 3 Social History   Socioeconomic History   Marital status: Single    Spouse name: Not on file   Number of children: Not on file   Years of education: Not on file   Highest education level: 12th grade  Occupational History   Occupation: Disabled  Tobacco Use   Smoking status: Never   Smokeless tobacco: Never  Vaping Use   Vaping status: Never Used  Substance and Sexual Activity   Alcohol use: No   Drug use: No   Sexual activity: Not Currently  Other Topics Concern   Not on file  Social History Narrative   Autistic   Lives with Mother   Social Drivers of Health   Financial Resource Strain: Low Risk  (03/05/2023)   Overall  Financial Resource Strain (CARDIA)    Difficulty of Paying Living Expenses: Not hard at all  Food Insecurity: No Food Insecurity (03/05/2023)   Hunger Vital Sign    Worried About Running Out of Food in the Last Year: Never true    Ran Out of Food in the Last Year: Never true  Transportation Needs: No Transportation Needs (03/05/2023)   PRAPARE - Administrator, Civil Service (Medical): No    Lack of Transportation (Non-Medical): No  Physical Activity: Insufficiently Active (03/05/2023)   Exercise Vital Sign    Days of Exercise per Week: 3 days    Minutes of Exercise per Session: 30 min  Stress: No Stress Concern Present (03/05/2023)   [employer] of Occupational Health - Occupational Stress Questionnaire    Feeling of Stress : Not at all  Social Connections: Moderately Isolated (03/05/2023)   Social Connection and Isolation Panel    Frequency of Communication with Friends and Family: More than three times a week    Frequency of Social Gatherings with Friends and Family: Three times a week    Attends Religious Services: 1 to 4 times per year    Active Member of Clubs or Organizations: No    Attends Banker Meetings: Never    Marital Status: Never married  Intimate Partner Violence: Not At Risk (03/05/2023)   Humiliation, Afraid, Rape, and Kick questionnaire    Fear of Current or Ex-Partner: No    Emotionally Abused: No    Physically Abused: No    Sexually Abused: No   Family History  Problem Relation Age of Onset   Diabetes Mother    Diabetes Father    Bladder Cancer Father    Colon cancer Neg Hx    Esophageal cancer Neg Hx    Liver cancer Neg Hx    Stomach cancer Neg Hx     Objective: Office vital signs reviewed. BP 123/82   Pulse 87   Temp (!) 97.2 F (36.2 C)   Ht 6' 3 (1.905 m)   Wt 236 lb 6 oz (107.2 kg)   SpO2 95%   BMI 29.54 kg/m   Physical Examination:  General: Awake, alert, nontoxic-appearing male, No acute  distress HEENT: sclera white, MMM, oropharynx without exudates but there is some mild erythema Cardio: regular rate and rhythm  Pulm: Normal work of breathing on room air GI: soft, non-tender, non-distended, bowel sounds present x4, no hepatomegaly, no splenomegaly, no masses GU: No suprapubic tenderness palpation.  No CVA tenderness palpation.  Assessment/ Plan: 53 y.o. male   Acute cystitis with hematuria - Plan: Urinalysis, Complete, Urine Culture, cefTRIAXone (ROCEPHIN) injection 1 g, cephALEXin (KEFLEX) 500  MG capsule   I personally looked at urine and it is not purple but it was dark so we completed urinalysis which demonstrated 3+ blood, glucose and some bacteria.  This has been sent for culture.  Given ceftriaxone intramuscularly and will be placed on Keflex 4 times daily tomorrow.  Push oral hydration.  Hold Synjardy  for the next week.  Okay to back down to 0.25 mg of Ozempic  on Thursday   Norene CHRISTELLA Fielding, DO [facility] [phone]

## 2024-02-25 NOTE — Patient Instructions (Signed)
 HOLD synjardy  for next week while we treat for UTI.   Once done with antibiotics, ok to resume use.  Ok to back down to 0.25mg  of the Ozempic  on Thursday. Rocephin given today. Start Cephalexin tomorrow with breakfast.  Urinary Tract Infection, Male A urinary tract infection (UTI) is an infection in your urinary tract. The urinary tract is made up of the organs that make, store, and get rid of pee (urine) in your body. These organs include: The kidneys. The ureters. The bladder. The urethra. What are the causes? Most UTIs are caused by germs called bacteria. They may be in or near your genitals. These germs grow and cause swelling in your urinary tract. What increases the risk? You're more likely to get a UTI if: You have a soft tube called a catheter that drains your pee. You can't control when you pee or poop. You have trouble peeing because of: A prostate that's bigger than normal. A kidney stone. A urinary blockage. A nerve condition that affects your bladder. Not getting enough to drink. You're sexually active. You're an older adult. You're also more likely to get a UTI if you have other health problems, such as: Diabetes. A weak immune system. Your immune system is your body's defense system. Sickle cell disease. Injury of the spine. What are the signs or symptoms? Symptoms may include: Needing to pee right away. Peeing small amounts often. Trouble getting the stream started. Pain or burning when you pee. Blood in your pee. Pee that smells bad or odd. Pain in your belly or lower back. You may also: Feel confused. This may be the first symptom in older adults. Vomit. Not feel hungry. Feel tired or easily annoyed. Have a fever or chills. How is this diagnosed? A UTI is diagnosed based on your medical history and an exam. You may also have other tests. These may include: Pee tests. Blood tests. Tests for sexually transmitted infections (STIs). If you've had more  than one UTI, you may need to have imaging studies done to find out why you keep getting them. How is this treated? A UTI can be treated by: Taking antibiotics or other medicines. Drinking enough fluid to keep your pee pale yellow. In rare cases, a UTI can cause a very bad condition called sepsis. Sepsis may be treated in the hospital. Follow these instructions at home: Medicines Take your medicines only as told by your health care provider. If you were given antibiotics, take them as told by your provider. Do not stop taking them even if you start to feel better. General instructions Make sure you: Pee often and fully. Do not hold your pee for a long time. Pee after you have sex. Contact a health care provider if: Your symptoms don't get better after 1-2 days of taking antibiotics. Your symptoms go away and then come back. You have a fever or chills. You vomit or feel like you may vomit. Get help right away if: You have very bad pain in your back or lower belly. You faint. This information is not intended to replace advice given to you by your health care provider. Make sure you discuss any questions you have with your health care provider. Document Revised: 03/07/2023 Document Reviewed: 06/30/2022 Elsevier Patient Education  2025 [employer].

## 2024-02-25 NOTE — Telephone Encounter (Signed)
 Appt made.

## 2024-02-26 ENCOUNTER — Telehealth: Payer: Self-pay | Admitting: Pharmacist

## 2024-02-26 NOTE — Telephone Encounter (Signed)
° °  This patient is appearing on a report for being at risk of failing the adherence measure for SUPD cholesterol (statin) medications this calendar year.   Medication: pravastatin  Last fill date: no history of fills LDL 73/at goal  Left voicemail for patient to return my call at their convenience.   Michael Ramos Wilsie Kern, PharmD, BCACP, CPP Clinical Pharmacist, [facility]

## 2024-02-27 ENCOUNTER — Other Ambulatory Visit: Payer: Self-pay | Admitting: Family Medicine

## 2024-02-27 DIAGNOSIS — K219 Gastro-esophageal reflux disease without esophagitis: Secondary | ICD-10-CM

## 2024-02-27 LAB — URINE CULTURE

## 2024-03-05 ENCOUNTER — Ambulatory Visit: Payer: Medicare Other

## 2024-03-05 VITALS — BP 123/82 | HR 87 | Ht 75.0 in | Wt 236.0 lb

## 2024-03-05 DIAGNOSIS — Z0000 Encounter for general adult medical examination without abnormal findings: Secondary | ICD-10-CM

## 2024-03-05 NOTE — Progress Notes (Signed)
 Chief Complaint  Patient presents with   Medicare Wellness     Subjective:   Michael Ramos is a 53 y.o. male who presents for a Medicare Annual Wellness Visit.  Allergies (verified) Patient has no active allergies.   History: Past Medical History:  Diagnosis Date   Allergy    Autism    CHF (congestive heart failure) (HCC)    a. EF 20-25% by echo in 08/2016 b. repeat echo in 02/2017 showing EF 30-35% with cath showing normal cors.    Diabetes mellitus without complication (HCC)    Obesity (BMI 30-39.9) 10/23/2017   Past Surgical History:  Procedure Laterality Date   RIGHT/LEFT HEART CATH AND CORONARY ANGIOGRAPHY N/A 03/06/2017   Procedure: RIGHT/LEFT HEART CATH AND CORONARY ANGIOGRAPHY;  Surgeon: Verlin Lonni BIRCH, MD;  Location: [facility];  Service: Cardiovascular;  Laterality: N/A;   Family History  Problem Relation Age of Onset   Diabetes Mother    Diabetes Father    Bladder Cancer Father    Colon cancer Neg Hx    Esophageal cancer Neg Hx    Liver cancer Neg Hx    Stomach cancer Neg Hx    Social History   Occupational History   Occupation: Disabled  Tobacco Use   Smoking status: Never   Smokeless tobacco: Never  Vaping Use   Vaping status: Never Used  Substance and Sexual Activity   Alcohol use: No   Drug use: No   Sexual activity: Not Currently   Tobacco Counseling Counseling given: Yes  SDOH Screenings   Food Insecurity: No Food Insecurity (03/05/2024)  Housing: Unknown (03/05/2024)  Transportation Needs: No Transportation Needs (03/05/2024)  Utilities: Not At Risk (03/05/2024)  Alcohol Screen: Low Risk  (03/05/2023)  Depression (PHQ2-9): Low Risk  (03/05/2024)  Financial Resource Strain: Low Risk  (03/05/2023)  Physical Activity: Inactive (03/05/2024)  Social Connections: Moderately Isolated (03/05/2024)  Stress: No Stress Concern Present (03/05/2024)  Tobacco Use: Low Risk  (03/05/2024)  Health Literacy: Inadequate Health  Literacy (03/05/2024)   See flowsheets for full screening details  Depression Screen PHQ 2 & 9 Depression Scale- Over the past 2 weeks, how often have you been bothered by any of the following problems? Little interest or pleasure in doing things: 0 Feeling down, depressed, or hopeless (PHQ Adolescent also includes...irritable): 0 PHQ-2 Total Score: 0 Trouble falling or staying asleep, or sleeping too much: 0 Feeling tired or having little energy: 0 Poor appetite or overeating (PHQ Adolescent also includes...weight loss): 0 Feeling bad about yourself - or that you are a failure or have let yourself or your family down: 0 Trouble concentrating on things, such as reading the newspaper or watching television (PHQ Adolescent also includes...like school work): 0 Moving or speaking so slowly that other people could have noticed. Or the opposite - being so fidgety or restless that you have been moving around a lot more than usual: 0 Thoughts that you would be better off dead, or of hurting yourself in some way: 0 PHQ-9 Total Score: 0 If you checked off any problems, how difficult have these problems made it for you to do your work, take care of things at home, or get along with other people?: Not difficult at all     Goals Addressed             This Visit's Progress    Patient Stated   On track    Continue to remain healthy  Visit info / Clinical Intake: Medicare Wellness Visit Type:: Subsequent Annual Wellness Visit Persons participating in visit:: patient & caregiver (pt's sister, susan) Medicare Wellness Visit Mode:: Telephone If telephone:: video declined Because this visit was a virtual/telehealth visit:: vitals recorded from last visit If Telephone or Video please confirm:: I connected with the patient using audio enabled telemedicine application and verified that I am speaking with the correct person using two identifiers Patient Location:: home Provider Location::  office Information given by:: family (pt's sister, susan) Interpreter Needed?: No Pre-visit prep was completed: yes AWV questionnaire completed by patient prior to visit?: no Living arrangements:: with family/others Patient's Overall Health Status Rating: very good Typical amount of pain: none Does pain affect daily life?: no Are you currently prescribed opioids?: no  Dietary Habits and Nutritional Risks How many meals a day?: 3 Eats fruit and vegetables daily?: yes Most meals are obtained by: preparing own meals In the last 2 weeks, have you had any of the following?: none Diabetic:: (!) yes Any non-healing wounds?: no How often do you check your BS?: 3 Would you like to be referred to a Nutritionist or for Diabetic Management? : no  Functional Status Activities of Daily Living (to include ambulation/medication): Independent Ambulation: Independent Medication Administration: Needs assistance (comment) (pt's sister, susan/mom) Home Management: Needs assistance (comment) (pt's sister, susan/mom) Manage your own finances?: (!) no (pt's sister, susan/mom) Primary transportation is: family/friends (pt's sister, susan/mom) Concerns about hearing?: no  Fall Screening Falls in the past year?: 0 Number of falls in past year: 0 Was there an injury with Fall?: 0 Fall Risk Category Calculator: 0 Patient Fall Risk Level: Low Fall Risk  Fall Risk Patient at Risk for Falls Due to: No Fall Risks Fall risk Follow up: Falls evaluation completed; Education provided  Home and Transportation Safety: All rugs have non-skid backing?: yes All stairs or steps have railings?: yes Grab bars in the bathtub or shower?: (!) no Have non-skid surface in bathtub or shower?: yes Good home lighting?: yes Regular seat belt use?: yes Hospital stays in the last year:: no  Cognitive Assessment Difficulty concentrating, remembering, or making decisions? : no (pt's sister, susan/mom) Will 6CIT or Mini  Cog be Completed: no 6CIT or Mini Cog Declined: patient has a diagnosis of dementia or cognitive impairment  Advance Directives (For Healthcare) Does Patient Have a Medical Advance Directive?: No Would patient like information on creating a medical advance directive?: No - Patient declined  Reviewed/Updated  Reviewed/Updated: Reviewed All (Medical, Surgical, Family, Medications, Allergies, Care Teams, Patient Goals); Medical History; Surgical History; Family History; Medications; Allergies; Care Teams; Patient Goals        Objective:    Today's Vitals   03/05/24 0826  BP: 123/82  Pulse: 87  Weight: 236 lb (107 kg)  Height: 6' 3 (1.905 m)   Body mass index is 29.5 kg/m.  Current Medications (verified) Outpatient Encounter Medications as of 03/05/2024  Medication Sig   carvedilol  (COREG ) 12.5 MG tablet TAKE 1&1/2 TABLETS BY MOUTH TWICE DAILY   desloratadine  (CLARINEX ) 5 MG tablet Take 1 tablet (5 mg total) by mouth daily as needed (allergies).   diclofenac  Sodium (VOLTAREN ) 1 % GEL Voltaren  1 % topical gel   Empagliflozin -metFORMIN  HCl ER (SYNJARDY  XR) 25-1000 MG TB24 Take 1 tablet by mouth daily. To replace Trijardy    fluticasone  (FLONASE ) 50 MCG/ACT nasal spray Place 2 sprays into both nostrils daily.   furosemide  (LASIX ) 20 MG tablet TAKE ONE TABLET BY MOUTH DAILY AS NEEDED  glucose blood (ACCU-CHEK GUIDE TEST) test strip 1 each by Other route in the morning, at noon, and at bedtime. Use as instructed   meclizine  (ANTIVERT ) 12.5 MG tablet TAKE 1-2 TABLETS BY MOUTH 3 TIMES DAILY AS NEEDED FOR DIZZINESS   pantoprazole  (PROTONIX ) 40 MG tablet TAKE 1 TABLET BY MOUTH TWICE DAILY BEFORE MEALS   potassium chloride  SA (KLOR-CON  M) 20 MEQ tablet TAKE TWO TABLETS BY MOUTH DAILY   pravastatin  (PRAVACHOL ) 10 MG tablet TAKE ONE (1) TABLET EACH DAY   sacubitril -valsartan  (ENTRESTO ) 24-26 MG Take 1 tablet by mouth 2 (two) times daily.   Semaglutide ,0.25 or 0.5MG /DOS, (OZEMPIC , 0.25 OR 0.5  MG/DOSE,) 2 MG/3ML SOPN Inject 0.25 mg into the skin every 7 (seven) days for 28 days, THEN 0.5 mg every 7 (seven) days.   solifenacin  (VESICARE ) 5 MG tablet Take 1 tablet (5 mg total) by mouth daily.   tamsulosin  (FLOMAX ) 0.4 MG CAPS capsule Take 1 capsule (0.4 mg total) by mouth in the morning and at bedtime.   Vitamin D , Ergocalciferol , (DRISDOL ) 1.25 MG (50000 UNIT) CAPS capsule Take 1 capsule (50,000 Units total) by mouth every 7 (seven) days.   No facility-administered encounter medications on file as of 03/05/2024.   Hearing/Vision screen Hearing Screening - Comments:: Per pt sister pt denies hearing dif Vision Screening - Comments:: Per pt's sister pt denies vision dif Immunizations and Health Maintenance Health Maintenance  Topic Date Due   COVID-19 Vaccine (4 - 2025-26 season) 12/10/2023   Hepatitis B Vaccines 19-59 Average Risk (2 of 2 - CpG 2-dose series) 03/12/2024   HEMOGLOBIN A1C  08/10/2024   Diabetic kidney evaluation - eGFR measurement  11/08/2024   Diabetic kidney evaluation - Urine ACR  11/08/2024   OPHTHALMOLOGY EXAM  12/05/2024   FOOT EXAM  02/10/2025   Medicare Annual Wellness (AWV)  03/05/2025   Colonoscopy  11/19/2028   DTaP/Tdap/Td (2 - Td or Tdap) 02/08/2030   Pneumococcal Vaccine: 50+ Years  Completed   Influenza Vaccine  Completed   Hepatitis C Screening  Completed   HIV Screening  Completed   Zoster Vaccines- Shingrix   Completed   HPV VACCINES  Aged Out   Meningococcal B Vaccine  Aged Out        Assessment/Plan:  This is a routine wellness examination for Michael Ramos.  Patient Care Team: Jolinda Norene HERO, DO as PCP - General (Family Medicine) Alvan Dorn FALCON, MD as PCP - Cardiology (Cardiology) Billee Mliss BIRCH, [facility] as Pharmacist (Family Medicine) [facility]  I have personally reviewed and noted the following in the patient's chart:   Medical and social history Use of alcohol, tobacco or illicit drugs  Current  medications and supplements including opioid prescriptions. Functional ability and status Nutritional status Physical activity Advanced directives List of other physicians Hospitalizations, surgeries, and ER visits in previous 12 months Vitals Screenings to include cognitive, depression, and falls Referrals and appointments  No orders of the defined types were placed in this encounter.  In addition, I have reviewed and discussed with patient certain preventive protocols, quality metrics, and best practice recommendations. A written personalized care plan for preventive services as well as general preventive health recommendations were provided to patient.   Michael Ramos, CMA   03/05/2024   Return in 1 year (on 03/05/2025).  After Visit Summary: (MyChart) Due to this being a telephonic visit, the after visit summary with patients personalized plan was offered to patient via MyChart   Nurse Notes: per pt's sister  will not get the Covid vaccine for this yr

## 2024-03-14 ENCOUNTER — Ambulatory Visit: Admitting: *Deleted

## 2024-03-14 DIAGNOSIS — Z789 Other specified health status: Secondary | ICD-10-CM

## 2024-03-14 DIAGNOSIS — Z23 Encounter for immunization: Secondary | ICD-10-CM

## 2024-03-14 NOTE — Progress Notes (Signed)
 Patient is in office today for a nurse visit for Immunization. Patient Injection was given in the  Right deltoid. Patient tolerated injection well.

## 2024-03-17 ENCOUNTER — Ambulatory Visit: Admitting: Urology

## 2024-03-17 ENCOUNTER — Other Ambulatory Visit (HOSPITAL_COMMUNITY): Payer: Self-pay

## 2024-03-17 DIAGNOSIS — N401 Enlarged prostate with lower urinary tract symptoms: Secondary | ICD-10-CM

## 2024-03-18 ENCOUNTER — Telehealth: Payer: Self-pay

## 2024-03-24 NOTE — Telephone Encounter (Signed)
 Per  Dr. Sherrilee patient can f/u 3-6 months. Appointment made and mailed to patient.

## 2024-04-02 ENCOUNTER — Encounter: Payer: Self-pay | Admitting: Cardiology

## 2024-04-02 ENCOUNTER — Ambulatory Visit: Attending: Cardiology | Admitting: Cardiology

## 2024-04-02 VITALS — BP 126/80 | HR 90 | Ht 75.0 in | Wt 238.8 lb

## 2024-04-02 DIAGNOSIS — I10 Essential (primary) hypertension: Secondary | ICD-10-CM | POA: Diagnosis not present

## 2024-04-02 DIAGNOSIS — I5020 Unspecified systolic (congestive) heart failure: Secondary | ICD-10-CM

## 2024-04-02 DIAGNOSIS — E782 Mixed hyperlipidemia: Secondary | ICD-10-CM

## 2024-04-02 NOTE — Patient Instructions (Signed)
 Medication Instructions:  Your physician recommends that you continue on your current medications as directed. Please refer to the Current Medication list given to you today.  *If you need a refill on your cardiac medications before your next appointment, please call your pharmacy*  Lab Work: None If you have labs (blood work) drawn today and your tests are completely normal, you will receive your results only by: MyChart Message (if you have MyChart) OR A paper copy in the mail If you have any lab test that is abnormal or we need to change your treatment, we will call you to review the results.  Testing/Procedures: None  Follow-Up: At [facility], you and your health needs are our priority.  As part of our continuing mission to provide you with exceptional heart care, our providers are all part of one team.  This team includes your primary Cardiologist (physician) and Advanced Practice Providers or APPs (Physician Assistants and Nurse Practitioners) who all work together to provide you with the care you need, when you need it.  Your next appointment:   1 year(s)  Provider:   You may see Alvan Carrier, MD or the following Advanced Practice Provider on your designated Care Team:   Almarie Crate, NP    We recommend signing up for the patient portal called MyChart.  Sign up information is provided on this After Visit Summary.  MyChart is used to connect with patients for Virtual Visits (Telemedicine).  Patients are able to view lab/test results, encounter notes, upcoming appointments, etc.  Non-urgent messages can be sent to your provider as well.   To learn more about what you can do with MyChart, go to [email or web address].   Other Instructions

## 2024-04-02 NOTE — Progress Notes (Signed)
 "     Clinical Summary Michael Ramos is a 53 y.o.male seen today for follow up of the following medical problems.    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   07/2017 echo LVEF 35-40%, grade II diastolic dysfunction  09/2019 echo: LVEF 50-55%, no WMAs    - dizziness has resolved. Over last few visits had lowered his coreg  and entresto  to see if related - no SOB/DOE, no recent edema - compliant with meds     2. Nocturnal hypoxia/Possible sleep apnea - followed by Dr Shlomo - drop in O2 sats noted during home oximetry. There were discussions about a formal sleep study however family does not feel patient could cooperate with study due to his autism     3. Autism   4. CKD     5. Hyperlipidemia -02/2021 TC 129 TG 196 HDL 37 LDL 60 03/2022 TC 130 TG 176 HDL 43 LDL 58 - 11/2023 TC 858 TG 871 HDL 45 LDL 73      6. HTN -he is compliant with meds       SH: Has 7 brothers and sisters.  Past Medical History:  Diagnosis Date   Allergy    Autism    CHF (congestive heart failure) (HCC)    a. EF 20-25% by echo in 08/2016 b. repeat echo in 02/2017 showing EF 30-35% with cath showing normal cors.    Diabetes mellitus without complication (HCC)    Obesity (BMI 30-39.9) 10/23/2017     Allergies[1]   Current Outpatient Medications  Medication Sig Dispense Refill   carvedilol  (COREG ) 12.5 MG tablet TAKE 1&1/2 TABLETS BY MOUTH TWICE DAILY 270 tablet 3   desloratadine  (CLARINEX ) 5 MG tablet Take 1 tablet (5 mg total) by mouth daily as needed (allergies). 90 tablet 3   diclofenac  Sodium (VOLTAREN ) 1 % GEL Voltaren  1 % topical gel     Empagliflozin -metFORMIN  HCl ER (SYNJARDY  XR) 25-1000 MG TB24 Take 1 tablet by mouth daily. To replace Trijardy  90 tablet 3   fluticasone  (FLONASE ) 50 MCG/ACT nasal spray Place 2 sprays into both nostrils daily. 16 g 6   furosemide  (LASIX )  20 MG tablet TAKE ONE TABLET BY MOUTH DAILY AS NEEDED 90 tablet 2   glucose blood (ACCU-CHEK GUIDE TEST) test strip 1 each by Other route in the morning, at noon, and at bedtime. Use as instructed     meclizine  (ANTIVERT ) 12.5 MG tablet TAKE 1-2 TABLETS BY MOUTH 3 TIMES DAILY AS NEEDED FOR DIZZINESS 30 tablet 1   pantoprazole  (PROTONIX ) 40 MG tablet TAKE 1 TABLET BY MOUTH TWICE DAILY BEFORE MEALS 180 tablet 4   potassium chloride  SA (KLOR-CON  M) 20 MEQ tablet TAKE TWO TABLETS BY MOUTH DAILY 180 tablet 1   pravastatin  (PRAVACHOL ) 10 MG tablet TAKE ONE (1) TABLET EACH DAY 90 tablet 3   sacubitril -valsartan  (ENTRESTO ) 24-26 MG Take 1 tablet by mouth 2 (two) times daily. 60 tablet 6   Semaglutide ,0.25 or 0.5MG /DOS, (OZEMPIC , 0.25 OR 0.5 MG/DOSE,) 2 MG/3ML SOPN Inject 0.25 mg into the skin every 7 (seven) days for 28 days, THEN 0.5 mg every 7 (seven) days. 9 mL 3   solifenacin  (VESICARE ) 5 MG tablet Take 1 tablet (5 mg total) by mouth daily. 30 tablet 11   tamsulosin  (FLOMAX ) 0.4 MG CAPS capsule Take 1 capsule (0.4 mg total) by mouth in the morning and at bedtime. 60  capsule 5   Vitamin D , Ergocalciferol , (DRISDOL ) 1.25 MG (50000 UNIT) CAPS capsule Take 1 capsule (50,000 Units total) by mouth every 7 (seven) days. 12 capsule 3   No current facility-administered medications for this visit.     Past Surgical History:  Procedure Laterality Date   RIGHT/LEFT HEART CATH AND CORONARY ANGIOGRAPHY N/A 03/06/2017   Procedure: RIGHT/LEFT HEART CATH AND CORONARY ANGIOGRAPHY;  Surgeon: Verlin Lonni BIRCH, MD;  Location: [facility];  Service: Cardiovascular;  Laterality: N/A;     Allergies[2]    Family History  Problem Relation Age of Onset   Diabetes Mother    Diabetes Father    Bladder Cancer Father    Colon cancer Neg Hx    Esophageal cancer Neg Hx    Liver cancer Neg Hx    Stomach cancer Neg Hx      Social History Michael Ramos reports that he has never smoked. He has never used  smokeless tobacco. Michael Ramos reports no history of alcohol use.     Physical Examination Today's Vitals   04/02/24 0914  BP: 126/80  Pulse: 90  SpO2: 98%  Weight: 238 lb 12.8 oz (108.3 kg)  Height: 6' 3 (1.905 m)   Body mass index is 29.85 kg/m.  Gen: resting comfortably, no acute distress HEENT: no scleral icterus, pupils equal round and reactive, no palptable cervical adenopathy,  CV: RRR, no mrg, no jvd Resp: Clear to auscultation bilaterally GI: abdomen is soft, non-tender, non-distended, normal bowel sounds, no hepatosplenomegaly MSK: extremities are warm, no edema.  Skin: warm, no rash Neuro:  no focal deficits Psych: appropriate affect   Diagnostic Studies 02/2017 cath Hemodynamic findings consistent with mild pulmonary hypertension.   1. No angiographic evidence of CAD 2. Non-ischemic cardiomyopathy   Recommendations: medical management of cardiomyopathy and CHF.      02/2017 echo Study Conclusions   - Left ventricle: The cavity size was mildly dilated. Wall   thickness was normal. Systolic function was moderately to   severely reduced. The estimated ejection fraction was in the   range of 30% to 35%. Diffuse hypokinesis. Diastolic dysfunction,   grade indeterminate. Indeterminate filling pressures. - Mitral valve: There was mild regurgitation.     07/2017 echo Study Conclusions   - Left ventricle: The cavity size was mildly dilated. Wall   thickness was normal. Systolic function was moderately reduced.   The estimated ejection fraction was in the range of 35% to 40%.   Diffuse hypokinesis. Features are consistent with a pseudonormal   left ventricular filling pattern, with concomitant abnormal   relaxation and increased filling pressure (grade 2 diastolic   dysfunction). - Mitral valve: There was mild regurgitation.     09/2019 echo 1. Left ventricular ejection fraction, by estimation, is 50 to 55%. The  left ventricle has normal function. The  left ventricle has no regional  wall motion abnormalities. Left ventricular diastolic parameters were  normal.   2. Right ventricular systolic function is normal. The right ventricular  size is normal. There is normal pulmonary artery systolic pressure.   3. The mitral valve is normal in structure. No evidence of mitral valve  regurgitation. No evidence of mitral stenosis.   4. The aortic valve was not well visualized. Aortic valve regurgitation  is not visualized. No aortic stenosis is present.   5. The inferior vena cava is normal in size with greater than 50%  respiratory variability, suggesting right atrial pressure of 3 mmHg.  Assessment and Plan   1. HFimpEF/ NICM - by most recent echo LVEF has noramlzied -denies any symptoms, continue current meds   2. HTN - his bp is well controlled, continue current therapy   3. Hyperlipidemia - lipids are at goal, continue current meds     Dorn PHEBE Ross, M.D.     [1] No Active Allergies [2] No Active Allergies  "

## 2024-04-05 ENCOUNTER — Other Ambulatory Visit: Payer: Self-pay | Admitting: Cardiology

## 2024-09-29 ENCOUNTER — Ambulatory Visit: Admitting: Urology

## 2025-03-10 ENCOUNTER — Ambulatory Visit
